# Patient Record
Sex: Female | Born: 1950 | Race: White | Hispanic: No | Smoking: Former smoker
Health system: Southern US, Community
[De-identification: ages and names within clinical notes are randomized; demographics above are authoritative.]

## PROBLEM LIST (undated history)

## (undated) DIAGNOSIS — G4730 Sleep apnea, unspecified: Secondary | ICD-10-CM

## (undated) DIAGNOSIS — R519 Headache, unspecified: Secondary | ICD-10-CM

## (undated) DIAGNOSIS — F4310 Post-traumatic stress disorder, unspecified: Secondary | ICD-10-CM

## (undated) DIAGNOSIS — M1990 Unspecified osteoarthritis, unspecified site: Secondary | ICD-10-CM

## (undated) DIAGNOSIS — G20 Parkinson's disease: Secondary | ICD-10-CM

## (undated) DIAGNOSIS — G20A1 Parkinson's disease without dyskinesia, without mention of fluctuations: Secondary | ICD-10-CM

## (undated) DIAGNOSIS — E119 Type 2 diabetes mellitus without complications: Secondary | ICD-10-CM

## (undated) DIAGNOSIS — M797 Fibromyalgia: Secondary | ICD-10-CM

## (undated) DIAGNOSIS — I10 Essential (primary) hypertension: Secondary | ICD-10-CM

## (undated) DIAGNOSIS — F329 Major depressive disorder, single episode, unspecified: Secondary | ICD-10-CM

## (undated) DIAGNOSIS — E785 Hyperlipidemia, unspecified: Secondary | ICD-10-CM

## (undated) DIAGNOSIS — F419 Anxiety disorder, unspecified: Secondary | ICD-10-CM

## (undated) DIAGNOSIS — F32A Depression, unspecified: Secondary | ICD-10-CM

## (undated) DIAGNOSIS — R51 Headache: Secondary | ICD-10-CM

## (undated) DIAGNOSIS — J302 Other seasonal allergic rhinitis: Secondary | ICD-10-CM

## (undated) DIAGNOSIS — K635 Polyp of colon: Secondary | ICD-10-CM

## (undated) DIAGNOSIS — L409 Psoriasis, unspecified: Secondary | ICD-10-CM

## (undated) HISTORY — PX: TONSILLECTOMY: SUR1361

## (undated) HISTORY — PX: ABDOMINAL SURGERY: SHX537

## (undated) HISTORY — PX: RIGHT OOPHORECTOMY: SHX2359

## (undated) HISTORY — PX: ABDOMINAL HYSTERECTOMY: SHX81

## (undated) HISTORY — PX: POLYPECTOMY: SHX149

## (undated) HISTORY — PX: COLON SURGERY: SHX602

## (undated) HISTORY — PX: COLONOSCOPY: SHX174

---

## 2007-08-16 ENCOUNTER — Ambulatory Visit: Payer: Self-pay | Admitting: Internal Medicine

## 2008-05-31 ENCOUNTER — Emergency Department: Payer: Self-pay | Admitting: Emergency Medicine

## 2008-08-30 ENCOUNTER — Ambulatory Visit: Payer: Self-pay | Admitting: Internal Medicine

## 2010-06-20 ENCOUNTER — Ambulatory Visit: Payer: Self-pay | Admitting: Internal Medicine

## 2010-07-01 ENCOUNTER — Ambulatory Visit: Payer: Self-pay | Admitting: Internal Medicine

## 2010-07-31 ENCOUNTER — Ambulatory Visit: Payer: Self-pay | Admitting: Internal Medicine

## 2010-08-31 ENCOUNTER — Ambulatory Visit: Payer: Self-pay | Admitting: Internal Medicine

## 2010-12-11 ENCOUNTER — Ambulatory Visit: Payer: Self-pay | Admitting: Internal Medicine

## 2011-03-02 ENCOUNTER — Ambulatory Visit: Payer: Self-pay | Admitting: Gastroenterology

## 2011-03-05 LAB — PATHOLOGY REPORT

## 2011-04-16 ENCOUNTER — Ambulatory Visit: Payer: Self-pay | Admitting: Surgery

## 2011-04-23 ENCOUNTER — Inpatient Hospital Stay: Payer: Self-pay | Admitting: Surgery

## 2011-04-27 LAB — PATHOLOGY REPORT

## 2011-07-17 ENCOUNTER — Emergency Department: Payer: Self-pay | Admitting: *Deleted

## 2012-02-16 ENCOUNTER — Ambulatory Visit: Payer: Self-pay | Admitting: Neurology

## 2012-06-09 ENCOUNTER — Ambulatory Visit: Payer: Self-pay | Admitting: Gastroenterology

## 2012-12-23 ENCOUNTER — Other Ambulatory Visit: Payer: Self-pay | Admitting: Podiatry

## 2012-12-27 LAB — WOUND CULTURE

## 2013-03-07 ENCOUNTER — Ambulatory Visit: Payer: Self-pay | Admitting: Internal Medicine

## 2014-01-18 DIAGNOSIS — I10 Essential (primary) hypertension: Secondary | ICD-10-CM | POA: Insufficient documentation

## 2014-01-18 DIAGNOSIS — E559 Vitamin D deficiency, unspecified: Secondary | ICD-10-CM | POA: Insufficient documentation

## 2014-01-18 DIAGNOSIS — E782 Mixed hyperlipidemia: Secondary | ICD-10-CM | POA: Insufficient documentation

## 2014-04-12 ENCOUNTER — Ambulatory Visit: Payer: Self-pay | Admitting: Internal Medicine

## 2014-08-06 ENCOUNTER — Emergency Department: Payer: Self-pay | Admitting: Student

## 2014-10-07 DIAGNOSIS — G20 Parkinson's disease: Secondary | ICD-10-CM | POA: Insufficient documentation

## 2014-10-19 ENCOUNTER — Ambulatory Visit: Payer: Self-pay | Admitting: Neurology

## 2015-02-26 ENCOUNTER — Ambulatory Visit: Payer: Medicare Other | Attending: Neurology

## 2015-02-26 DIAGNOSIS — G4730 Sleep apnea, unspecified: Secondary | ICD-10-CM | POA: Insufficient documentation

## 2015-04-03 ENCOUNTER — Ambulatory Visit: Payer: Medicare Other | Attending: Neurology

## 2015-04-03 DIAGNOSIS — G8929 Other chronic pain: Secondary | ICD-10-CM | POA: Diagnosis not present

## 2015-04-03 DIAGNOSIS — E119 Type 2 diabetes mellitus without complications: Secondary | ICD-10-CM | POA: Insufficient documentation

## 2015-04-03 DIAGNOSIS — G4733 Obstructive sleep apnea (adult) (pediatric): Secondary | ICD-10-CM | POA: Diagnosis present

## 2015-04-03 DIAGNOSIS — C801 Malignant (primary) neoplasm, unspecified: Secondary | ICD-10-CM | POA: Diagnosis not present

## 2015-04-03 DIAGNOSIS — R413 Other amnesia: Secondary | ICD-10-CM | POA: Insufficient documentation

## 2015-04-03 DIAGNOSIS — G20 Parkinson's disease: Secondary | ICD-10-CM | POA: Insufficient documentation

## 2015-04-03 DIAGNOSIS — I10 Essential (primary) hypertension: Secondary | ICD-10-CM | POA: Diagnosis not present

## 2015-05-08 ENCOUNTER — Encounter: Payer: Self-pay | Admitting: Emergency Medicine

## 2015-05-08 ENCOUNTER — Emergency Department
Admission: EM | Admit: 2015-05-08 | Discharge: 2015-05-09 | Disposition: A | Discharge status: Other Acute Inpatient Hospital | Payer: Medicare Other | Attending: Emergency Medicine | Admitting: Emergency Medicine

## 2015-05-08 DIAGNOSIS — R451 Restlessness and agitation: Secondary | ICD-10-CM | POA: Diagnosis not present

## 2015-05-08 DIAGNOSIS — F4310 Post-traumatic stress disorder, unspecified: Secondary | ICD-10-CM

## 2015-05-08 DIAGNOSIS — F911 Conduct disorder, childhood-onset type: Secondary | ICD-10-CM | POA: Diagnosis present

## 2015-05-08 DIAGNOSIS — I10 Essential (primary) hypertension: Secondary | ICD-10-CM

## 2015-05-08 DIAGNOSIS — E119 Type 2 diabetes mellitus without complications: Secondary | ICD-10-CM

## 2015-05-08 DIAGNOSIS — F312 Bipolar disorder, current episode manic severe with psychotic features: Secondary | ICD-10-CM

## 2015-05-08 HISTORY — DX: Depression, unspecified: F32.A

## 2015-05-08 HISTORY — DX: Major depressive disorder, single episode, unspecified: F32.9

## 2015-05-08 HISTORY — DX: Anxiety disorder, unspecified: F41.9

## 2015-05-08 LAB — ACETAMINOPHEN LEVEL: Acetaminophen (Tylenol), Serum: <10 ug/mL — ABNORMAL LOW (ref 10–30)

## 2015-05-08 LAB — COMPREHENSIVE METABOLIC PANEL
ALT: 12 U/L — ABNORMAL LOW (ref 14–54)
AST: 41 U/L (ref 15–41)
Albumin: 5.3 g/dL — ABNORMAL HIGH (ref 3.5–5.0)
Alkaline Phosphatase: 37 U/L — ABNORMAL LOW (ref 38–126)
Anion gap: 13 (ref 5–15)
BUN: 11 mg/dL (ref 6–20)
CO2: 22 mmol/L (ref 22–32)
Calcium: 9.8 mg/dL (ref 8.9–10.3)
Chloride: 105 mmol/L (ref 101–111)
Creatinine, Ser: 0.57 mg/dL (ref 0.44–1.00)
GFR calc Af Amer: >60 mL/min (ref >60)
GFR calc non Af Amer: >60 mL/min (ref >60)
Glucose, Bld: 146 mg/dL — ABNORMAL HIGH (ref 65–99)
Potassium: 3.9 mmol/L (ref 3.5–5.1)
Sodium: 140 mmol/L (ref 135–145)
Total Bilirubin: 0.6 mg/dL (ref 0.3–1.2)
Total Protein: 8.2 g/dL — ABNORMAL HIGH (ref 6.5–8.1)

## 2015-05-08 LAB — URINE DRUG SCREEN, QUALITATIVE (ARMC ONLY)
Amphetamines, Ur Screen: NOT DETECTED
Barbiturates, Ur Screen: NOT DETECTED
Benzodiazepine, Ur Scrn: NOT DETECTED
Cannabinoid 50 Ng, Ur ~~LOC~~: NOT DETECTED
Cocaine Metabolite,Ur ~~LOC~~: NOT DETECTED
MDMA (Ecstasy)Ur Screen: NOT DETECTED
Methadone Scn, Ur: NOT DETECTED
Opiate, Ur Screen: NOT DETECTED
Phencyclidine (PCP) Ur S: NOT DETECTED
Tricyclic, Ur Screen: NOT DETECTED

## 2015-05-08 LAB — URINALYSIS COMPLETE WITH MICROSCOPIC (ARMC ONLY)
Bilirubin Urine: NEGATIVE
Glucose, UA: >500 mg/dL — AB
Hgb urine dipstick: NEGATIVE
Leukocytes, UA: NEGATIVE
Nitrite: NEGATIVE
Protein, ur: NEGATIVE mg/dL
Specific Gravity, Urine: 1.005 (ref 1.005–1.030)
pH: 6 (ref 5.0–8.0)

## 2015-05-08 LAB — CBC
HCT: 39.8 % (ref 35.0–47.0)
Hemoglobin: 13.9 g/dL (ref 12.0–16.0)
MCH: 32.3 pg (ref 26.0–34.0)
MCHC: 35 g/dL (ref 32.0–36.0)
MCV: 92.4 fL (ref 80.0–100.0)
Platelets: 280 10*3/uL (ref 150–440)
RBC: 4.31 MIL/uL (ref 3.80–5.20)
RDW: 12.2 % (ref 11.5–14.5)
WBC: 8.1 10*3/uL (ref 3.6–11.0)

## 2015-05-08 LAB — ETHANOL: Alcohol, Ethyl (B): <5 mg/dL (ref <5)

## 2015-05-08 LAB — SALICYLATE LEVEL: Salicylate Lvl: <4 mg/dL (ref 2.8–30.0)

## 2015-05-08 NOTE — ED Notes (Signed)
BEHAVIORAL HEALTH ROUNDING Patient sleeping: Yes.   Patient alert and oriented: yes Behavior appropriate: Yes.  ; If no, describe:  Nutrition and fluids offered: Yes  Toileting and hygiene offered: Yes  Sitter present: no Law enforcement present: Yes  

## 2015-05-08 NOTE — ED Notes (Signed)

## 2015-05-08 NOTE — ED Notes (Signed)
pts cpap brought from home, bio med called for evaluation for pt use
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
## 2015-05-08 NOTE — ED Notes (Signed)
BEHAVIORAL HEALTH ROUNDING Patient sleeping: No. Patient alert and oriented: yes Behavior appropriate: Yes.  ;  Nutrition and fluids offered: Yes  Toileting and hygiene offered: Yes  Sitter present: yes Law enforcement present: Yes  
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
## 2015-05-08 NOTE — BHH Counselor (Signed)
Cart 2 placed in Pt room for TTS tele-assessment.

## 2015-05-08 NOTE — ED Notes (Signed)
BEHAVIORAL HEALTH ROUNDING Patient sleeping: No. Patient alert and oriented: yes Behavior appropriate: Yes.  ; If no, describe:  Nutrition and fluids offered: Yes  Toileting and hygiene offered: Yes  Sitter present: no Law enforcement present: Yes  

## 2015-05-08 NOTE — BHH Counselor (Signed)
Writer reviewed Pt Kendra Nicholson assessment and spoke with TTS Counselor Izora Gala S.). Writer consulted EDP Dr.Paduchowski regarding Pt disposition. Pt to be referred to Psych. MD for consult.
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
## 2015-05-08 NOTE — ED Notes (Signed)

## 2015-05-08 NOTE — ED Notes (Signed)
BEHAVIORAL HEALTH ROUNDING Patient sleeping: No. Patient alert and oriented: yes Behavior appropriate: Yes.  ; If no, describe:  Nutrition and fluids offered: Yes  Toileting and hygiene offered: Yes  Sitter present:no Law enforcement present: yes 
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
## 2015-05-09 ENCOUNTER — Inpatient Hospital Stay
Admission: EM | Admit: 2015-05-09 | Discharge: 2015-05-23 | DRG: 885 | Disposition: A | Discharge status: Home / Self Care | Payer: Medicare Other | Source: Intra-hospital | Attending: Psychiatry | Admitting: Psychiatry

## 2015-05-09 ENCOUNTER — Encounter: Payer: Self-pay | Admitting: Psychiatry

## 2015-05-09 DIAGNOSIS — Z818 Family history of other mental and behavioral disorders: Secondary | ICD-10-CM

## 2015-05-09 DIAGNOSIS — F3181 Bipolar II disorder: Secondary | ICD-10-CM | POA: Diagnosis present

## 2015-05-09 DIAGNOSIS — Z590 Homelessness: Secondary | ICD-10-CM | POA: Diagnosis not present

## 2015-05-09 DIAGNOSIS — R451 Restlessness and agitation: Secondary | ICD-10-CM | POA: Diagnosis not present

## 2015-05-09 DIAGNOSIS — Z79899 Other long term (current) drug therapy: Secondary | ICD-10-CM

## 2015-05-09 DIAGNOSIS — G4700 Insomnia, unspecified: Secondary | ICD-10-CM | POA: Diagnosis present

## 2015-05-09 DIAGNOSIS — Z91041 Radiographic dye allergy status: Secondary | ICD-10-CM | POA: Diagnosis not present

## 2015-05-09 DIAGNOSIS — E785 Hyperlipidemia, unspecified: Secondary | ICD-10-CM | POA: Diagnosis present

## 2015-05-09 DIAGNOSIS — F312 Bipolar disorder, current episode manic severe with psychotic features: Secondary | ICD-10-CM | POA: Insufficient documentation

## 2015-05-09 DIAGNOSIS — R45851 Suicidal ideations: Secondary | ICD-10-CM | POA: Diagnosis present

## 2015-05-09 DIAGNOSIS — I10 Essential (primary) hypertension: Secondary | ICD-10-CM | POA: Diagnosis present

## 2015-05-09 DIAGNOSIS — E119 Type 2 diabetes mellitus without complications: Secondary | ICD-10-CM

## 2015-05-09 DIAGNOSIS — M797 Fibromyalgia: Secondary | ICD-10-CM | POA: Diagnosis present

## 2015-05-09 DIAGNOSIS — F4310 Post-traumatic stress disorder, unspecified: Secondary | ICD-10-CM | POA: Diagnosis present

## 2015-05-09 DIAGNOSIS — G8929 Other chronic pain: Secondary | ICD-10-CM | POA: Diagnosis present

## 2015-05-09 DIAGNOSIS — F419 Anxiety disorder, unspecified: Secondary | ICD-10-CM | POA: Diagnosis present

## 2015-05-09 DIAGNOSIS — G20 Parkinson's disease: Secondary | ICD-10-CM | POA: Diagnosis present

## 2015-05-09 DIAGNOSIS — Z888 Allergy status to other drugs, medicaments and biological substances status: Secondary | ICD-10-CM

## 2015-05-09 DIAGNOSIS — Z915 Personal history of self-harm: Secondary | ICD-10-CM

## 2015-05-09 HISTORY — DX: Type 2 diabetes mellitus without complications: E11.9

## 2015-05-09 HISTORY — DX: Essential (primary) hypertension: I10

## 2015-05-09 LAB — LIPID PANEL
Cholesterol: 200 mg/dL (ref 0–200)
HDL: 43 mg/dL (ref >40)
LDL Cholesterol: UNDETERMINED mg/dL (ref 0–99)
Total CHOL/HDL Ratio: 4.7 RATIO
Triglycerides: 435 mg/dL — ABNORMAL HIGH (ref <150)
VLDL: UNDETERMINED mg/dL (ref 0–40)

## 2015-05-09 LAB — TSH: TSH: 3.609 u[IU]/mL (ref 0.350–4.500)

## 2015-05-09 LAB — GLUCOSE, CAPILLARY
Glucose-Capillary: 116 mg/dL — ABNORMAL HIGH (ref 65–99)
Glucose-Capillary: 168 mg/dL — ABNORMAL HIGH (ref 65–99)

## 2015-05-09 MED ORDER — CLOTRIMAZOLE 1 % EX CREA
TOPICAL_CREAM | Freq: Two times a day (BID) | CUTANEOUS | Status: DC
  Administered 2015-05-10: 22:00:00 via TOPICAL
  Administered 2015-05-10: 1 via TOPICAL
  Administered 2015-05-11: 22:00:00 via TOPICAL
  Administered 2015-05-11: 1 via TOPICAL
  Administered 2015-05-12 – 2015-05-15 (×8): via TOPICAL
  Administered 2015-05-16: 1 via TOPICAL
  Administered 2015-05-16 – 2015-05-17 (×3): via TOPICAL
  Administered 2015-05-18: 1 via TOPICAL
  Administered 2015-05-18 – 2015-05-21 (×6): via TOPICAL
  Administered 2015-05-21: 1 via TOPICAL
  Administered 2015-05-22: 10:00:00 via TOPICAL
  Administered 2015-05-22: 1 via TOPICAL
  Administered 2015-05-23: 10:00:00 via TOPICAL
  Filled 2015-05-09 (×3): qty 15

## 2015-05-09 MED ORDER — METOPROLOL SUCCINATE ER 50 MG PO TB24
50.0000 mg | ORAL_TABLET | Freq: Every day | ORAL | Status: DC
  Administered 2015-05-09: 50 mg via ORAL
  Filled 2015-05-09: qty 1

## 2015-05-09 MED ORDER — ALUM & MAG HYDROXIDE-SIMETH 200-200-20 MG/5ML PO SUSP: 30.0000 mL | ORAL | Status: DC | PRN

## 2015-05-09 MED ORDER — TIZANIDINE HCL 2 MG PO TABS: 2.0000 mg | ORAL_TABLET | Freq: Four times a day (QID) | ORAL | Status: DC | PRN

## 2015-05-09 MED ORDER — AMLODIPINE BESYLATE 5 MG PO TABS
10.0000 mg | ORAL_TABLET | Freq: Every day | ORAL | Status: DC
  Administered 2015-05-09: 10 mg via ORAL
  Filled 2015-05-09: qty 2

## 2015-05-09 MED ORDER — BUSPIRONE HCL 5 MG PO TABS
15.0000 mg | ORAL_TABLET | Freq: Two times a day (BID) | ORAL | Status: DC
  Administered 2015-05-09 – 2015-05-23 (×28): 15 mg via ORAL
  Filled 2015-05-09 (×30): qty 1

## 2015-05-09 MED ORDER — AMLODIPINE BESYLATE 10 MG PO TABS
10.0000 mg | ORAL_TABLET | Freq: Every day | ORAL | Status: DC
  Administered 2015-05-10 – 2015-05-23 (×14): 10 mg via ORAL
  Filled 2015-05-09 (×15): qty 1

## 2015-05-09 MED ORDER — CITALOPRAM HYDROBROMIDE 20 MG PO TABS
40.0000 mg | ORAL_TABLET | Freq: Every day | ORAL | Status: DC
  Administered 2015-05-09: 40 mg via ORAL
  Filled 2015-05-09: qty 2

## 2015-05-09 MED ORDER — ESTRADIOL 1 MG PO TABS
1.0000 mg | ORAL_TABLET | Freq: Every day | ORAL | Status: DC
  Administered 2015-05-09: 1 mg via ORAL
  Filled 2015-05-09: qty 1

## 2015-05-09 MED ORDER — ACETAMINOPHEN 325 MG PO TABS
650.0000 mg | ORAL_TABLET | Freq: Four times a day (QID) | ORAL | Status: DC | PRN
  Administered 2015-05-09 – 2015-05-23 (×33): 650 mg via ORAL
  Filled 2015-05-09 (×32): qty 2

## 2015-05-09 MED ORDER — CARBIDOPA-LEVODOPA 25-100 MG PO TABS
1.0000 | ORAL_TABLET | Freq: Three times a day (TID) | ORAL | Status: DC
  Administered 2015-05-09 – 2015-05-23 (×41): 1 via ORAL
  Filled 2015-05-09 (×43): qty 1

## 2015-05-09 MED ORDER — CLOTRIMAZOLE 1 % EX CREA
TOPICAL_CREAM | Freq: Two times a day (BID) | CUTANEOUS | Status: DC
  Filled 2015-05-09: qty 15

## 2015-05-09 MED ORDER — DOXEPIN HCL 25 MG PO CAPS
25.0000 mg | ORAL_CAPSULE | Freq: Every day | ORAL | Status: DC
Start: 2015-05-09 — End: 2015-05-09

## 2015-05-09 MED ORDER — METFORMIN HCL 500 MG PO TABS
1000.0000 mg | ORAL_TABLET | Freq: Two times a day (BID) | ORAL | Status: DC
  Administered 2015-05-09 – 2015-05-23 (×29): 1000 mg via ORAL
  Filled 2015-05-09 (×29): qty 2

## 2015-05-09 MED ORDER — BUSPIRONE HCL 10 MG PO TABS: 15.0000 mg | ORAL_TABLET | Freq: Two times a day (BID) | ORAL | Status: DC

## 2015-05-09 MED ORDER — PRAVASTATIN SODIUM 20 MG PO TABS
20.0000 mg | ORAL_TABLET | Freq: Every day | ORAL | Status: DC
  Administered 2015-05-09 – 2015-05-23 (×15): 20 mg via ORAL
  Filled 2015-05-09 (×16): qty 1

## 2015-05-09 MED ORDER — PRAVASTATIN SODIUM 20 MG PO TABS: 20.0000 mg | ORAL_TABLET | Freq: Every day | ORAL | Status: DC

## 2015-05-09 MED ORDER — METOPROLOL TARTRATE 50 MG PO TABS: 50.0000 mg | ORAL_TABLET | Freq: Every morning | ORAL | Status: DC

## 2015-05-09 MED ORDER — CITALOPRAM HYDROBROMIDE 20 MG PO TABS
40.0000 mg | ORAL_TABLET | Freq: Every day | ORAL | Status: DC
  Administered 2015-05-10 – 2015-05-23 (×14): 40 mg via ORAL
  Filled 2015-05-09: qty 1
  Filled 2015-05-09 (×14): qty 2

## 2015-05-09 MED ORDER — TIZANIDINE HCL 4 MG PO TABS
2.0000 mg | ORAL_TABLET | Freq: Four times a day (QID) | ORAL | Status: DC | PRN
  Administered 2015-05-12 – 2015-05-23 (×16): 2 mg via ORAL
  Filled 2015-05-09 (×17): qty 1

## 2015-05-09 MED ORDER — MAGNESIUM HYDROXIDE 400 MG/5ML PO SUSP: 30.0000 mL | Freq: Every day | ORAL | Status: DC | PRN

## 2015-05-09 MED ORDER — GLIPIZIDE ER 5 MG PO TB24
5.0000 mg | ORAL_TABLET | Freq: Every day | ORAL | Status: DC
  Administered 2015-05-10 – 2015-05-23 (×14): 5 mg via ORAL
  Filled 2015-05-09 (×14): qty 1

## 2015-05-09 MED ORDER — ESTRADIOL 1 MG PO TABS
1.0000 mg | ORAL_TABLET | Freq: Every day | ORAL | Status: DC
  Administered 2015-05-10 – 2015-05-23 (×14): 1 mg via ORAL
  Filled 2015-05-09 (×13): qty 1

## 2015-05-09 MED ORDER — CARBIDOPA-LEVODOPA 25-100 MG PO TABS
1.0000 | ORAL_TABLET | Freq: Three times a day (TID) | ORAL | Status: DC
  Administered 2015-05-09: 1 via ORAL
  Filled 2015-05-09 (×3): qty 1

## 2015-05-09 MED ORDER — QUETIAPINE FUMARATE 100 MG PO TABS
100.0000 mg | ORAL_TABLET | Freq: Every day | ORAL | Status: DC
  Administered 2015-05-09 – 2015-05-11 (×3): 100 mg via ORAL
  Filled 2015-05-09 (×3): qty 1

## 2015-05-09 MED ORDER — METFORMIN HCL 500 MG PO TABS
1000.0000 mg | ORAL_TABLET | Freq: Two times a day (BID) | ORAL | Status: DC
  Administered 2015-05-09: 1000 mg via ORAL
  Filled 2015-05-09 (×2): qty 2

## 2015-05-09 MED ORDER — GLIPIZIDE ER 5 MG PO TB24
5.0000 mg | ORAL_TABLET | Freq: Every day | ORAL | Status: DC
  Administered 2015-05-09: 5 mg via ORAL
  Filled 2015-05-09 (×2): qty 1

## 2015-05-09 MED ORDER — METOPROLOL SUCCINATE ER 25 MG PO TB24
50.0000 mg | ORAL_TABLET | Freq: Every day | ORAL | Status: DC
  Administered 2015-05-10 – 2015-05-23 (×14): 50 mg via ORAL
  Filled 2015-05-09 (×14): qty 2

## 2015-05-09 MED ORDER — DOXEPIN HCL 25 MG PO CAPS
25.0000 mg | ORAL_CAPSULE | Freq: Every day | ORAL | Status: DC
  Administered 2015-05-09 – 2015-05-12 (×4): 25 mg via ORAL
  Filled 2015-05-09 (×4): qty 1

## 2015-05-09 NOTE — ED Notes (Signed)
Report called to [facility] RN working in [employer]

## 2015-05-09 NOTE — ED Notes (Signed)
Lunch provided along with an extra drink  Pt observed with no unusual behavior  Appropriate to stimulation  No verbalized needs or concerns at this time  NAD assessed  Continue to monitor 

## 2015-05-09 NOTE — ED Notes (Signed)
She has ambulated to and from the BR  Pt observed with no unusual behavior  Appropriate to stimulation  No verbalized needs or concerns at this time  NAD assessed  Continue to monitor

## 2015-05-09 NOTE — ED Notes (Signed)
BEHAVIORAL HEALTH ROUNDING Patient sleeping: Yes.   Patient alert and oriented: eyes closed  Appears asleep Behavior appropriate: Yes.  ; If no, describe:  Nutrition and fluids offered: Yes  Toileting and hygiene offered: sleeping Sitter present: q 15 minute observations and security camera monitoring Law enforcement present: yes  ODS 

## 2015-05-09 NOTE — ED Notes (Signed)
BEHAVIORAL HEALTH ROUNDING Patient sleeping: Yes.   Patient alert and oriented: yes Behavior appropriate: Yes.  ; If no, describe:  Nutrition and fluids offered: Yes  Toileting and hygiene offered: Yes  Sitter present: no Law enforcement present: Yes  
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
## 2015-05-09 NOTE — ED Notes (Signed)
ENVIRONMENTAL ASSESSMENT Potentially harmful objects out of patient reach: Yes.   Personal belongings secured: Yes.   Patient dressed in hospital provided attire only: Yes.   Plastic bags out of patient reach: Yes.   Patient care equipment (cords, cables, call bells, lines, and drains) shortened, removed, or accounted for: Yes.   Equipment and supplies removed from bottom of stretcher: Yes.   Potentially toxic materials out of patient reach: Yes.   Sharps container removed or out of patient reach: Yes.     BEHAVIORAL HEALTH ROUNDING Patient sleeping: No. Patient alert and oriented: yes Behavior appropriate: Yes.  ; If no, describe:  Nutrition and fluids offered: yes Toileting and hygiene offered: Yes  Sitter present: q15 minute observations and security camera monitoring Law enforcement present: Yes  ODS  

## 2015-05-09 NOTE — ED Notes (Signed)
BEHAVIORAL HEALTH ROUNDING Patient sleeping: No. Patient alert and oriented: yes Behavior appropriate: Yes.  ; If no, describe:  Nutrition and fluids offered: yes Toileting and hygiene offered: Yes  Sitter present: q15 minute observations and security camera monitoring Law enforcement present: Yes  ODS  

## 2015-05-09 NOTE — ED Notes (Signed)
Patient assigned to appropriate care area. Patient oriented to unit/care area: Informed that, for their safety, care areas are designed for safety and monitored by security cameras at all times; and visiting hours explained to patient. Patient verbalizes understanding, and verbal contract for safety obtained.   ENVIRONMENTAL ASSESSMENT Potentially harmful objects out of patient reach: Yes.   Personal belongings secured: Yes.   Patient dressed in hospital provided attire only: Yes.   Plastic bags out of patient reach: Yes.   Patient care equipment (cords, cables, call bells, lines, and drains) shortened, removed, or accounted for: Yes.   Equipment and supplies removed from bottom of stretcher: Yes.   Potentially toxic materials out of patient reach: Yes.   Sharps container removed or out of patient reach: Yes.   

## 2015-05-09 NOTE — Progress Notes (Signed)
Patient resides in a [facility] ( [place]) [phone]. Patient apparently was served her 30 days notice and this still needs to be substantiated.LCSW called [facility] [phone] and left a detailed message awaiting a  call back.

## 2015-05-09 NOTE — ED Notes (Signed)
Breakfast provided  Pt observed with no unusual behavior  Appropriate to stimulation  No verbalized needs or concerns at this time  NAD assessed  Continue to monitor 

## 2015-05-09 NOTE — [facility] Counselor (Signed)
Pt. is to be admitted to [facility] by Dr. Weber Cooks. Attending Physician will be Dr. Bary Leriche.  Pt. has been assigned to room 301, by [facility]   Intake Paper Work has been signed and placed on pt. chart. ER staff [facility] Dr. Cinda Quest , ER MD; Amy H. Patient's Nurse & Debroah Baller Patient Access) have been made aware of the admission.   05/09/2015  Con Memos, MS, [place], [facility]

## 2015-05-09 NOTE — [facility] Counselor (Signed)
Pt is to be admitted to [facility], Information forwarded to the charge nurse, Silva Bandy. Awaiting discharges so that a bed assignment can be allocated.   05/09/2015 Con Memos, MS, [place]

## 2015-05-09 NOTE — ED Notes (Signed)
Pt reports that she has not been treated right at her current living situation -

## 2015-05-09 NOTE — ED Notes (Signed)
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
## 2015-05-10 DIAGNOSIS — F4310 Post-traumatic stress disorder, unspecified: Secondary | ICD-10-CM

## 2015-05-10 LAB — HEMOGLOBIN A1C: Hgb A1c MFr Bld: 5.9 % (ref 4.0–6.0)

## 2015-05-10 NOTE — Plan of Care (Signed)
Problem: Ineffective individual coping Goal: STG: Patient will remain free from self harm Outcome: Progressing Patient denies wanting to hurt herself at this time.
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
## 2015-05-10 NOTE — Progress Notes (Signed)
Recreation Therapy Notes  Date: 09.09.16 Time: 3:00 pm Location: Craft Room  Group Topic: Coping Skills  Goal Area(s) Addresses:  Patient will participate in coping skill. Patient will verbalize benefit of using art as a coping skill.  Behavioral Response: Attentive, Left early  Intervention: Coloring  Activity: Patients were given coloring sheets and instructed to color and think about the emotions they experienced.  Education: LRT educated patients on healthy coping skills.  Education Outcome: Patient left group before LRT educated patients.  Clinical Observations/Feedback: Patient colored worksheet. Patient left group at approximately 3:42 pm. Patient did not return to group.  Leonette Monarch, LRT/CTRS 05/10/2015 4:23 PM

## 2015-05-10 NOTE — BHH Group Notes (Signed)
[place] LCSW Group Therapy  05/10/2015 2:33 PM  Type of Therapy:  Group Therapy  Participation Level:  Active  Participation Quality:  Appropriate and Attentive  Affect:  Angry  Cognitive:  Alert, Appropriate and Oriented  Insight:  Limited  Engagement in Therapy:  Engaged  Modes of Intervention:  Socialization and Support  Summary of Progress/Problems: Patient arrived late for group and only shared the last 10 minutes of group stating that she was brought to the hospital in handcuffs by police and that she does not like guns. 

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
## 2015-05-10 NOTE — Tx Team (Signed)
Initial Interdisciplinary Treatment Plan   PATIENT STRESSORS: Traumatic event   PATIENT STRENGTHS: Communication skills Religious Affiliation   PROBLEM LIST: Problem List/Patient Goals Date to be addressed Date deferred Reason deferred Estimated date of resolution  Aggression 05/10/15     Anxiety 05/10/15                                                DISCHARGE CRITERIA:  Improved stabilization in mood, thinking, and/or behavior  PRELIMINARY DISCHARGE PLAN: Outpatient therapy  PATIENT/FAMIILY INVOLVEMENT: This treatment plan has been presented to and reviewed with the patient, Kendra Nicholson, and/or family member.  The patient and family have been given the opportunity to ask questions and make suggestions.  Nash Mantis [facility] 05/10/2015, 1:37 AM

## 2015-05-10 NOTE — Progress Notes (Signed)
D: Affect angry and hostile this am shift. Noted to blame other for her issues. Stated she did not do anything to any of the residents at the rehab facility Patient would be apologetic following . Patient walking with a walker . Appetite good and voice  no  concerns around sleeping , voice of being tired.  Appropriate ADL's and personal  Issues. Area beneath  Her abdomen fold noted redden  No drainage  Noted . A: Encourage patient participation , Instruction given on medication . Verbalized understanding of medication  Received . R: Voice no other concerns ,

## 2015-05-10 NOTE — Progress Notes (Signed)
Kendra Nicholson was pleasant on approach. She interacted well with peers and staff, she came to the med room for medication and was med compliant. She is still using walker to assist with ambulation. No aggression or SIB noted on shift. Patient appears to be resting in bed at this time. No behavioral issues to report on shift at this time.

## 2015-05-10 NOTE — Plan of Care (Signed)
Problem: Ineffective individual coping Goal: STG-Increase in ability to manage activities of daily living Outcome: Not Progressing Labile , easily angered
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
## 2015-05-11 MED ORDER — QUETIAPINE FUMARATE 25 MG PO TABS
25.0000 mg | ORAL_TABLET | Freq: Every morning | ORAL | Status: DC
  Administered 2015-05-12: 25 mg via ORAL
  Filled 2015-05-11: qty 1

## 2015-05-11 MED ORDER — FLUTICASONE PROPIONATE 50 MCG/ACT NA SUSP
2.0000 | Freq: Every day | NASAL | Status: DC
  Administered 2015-05-11 – 2015-05-23 (×13): 2 via NASAL
  Filled 2015-05-11: qty 16

## 2015-05-11 NOTE — BHH Group Notes (Signed)
[place] Group Notes:  (Nursing/MHT/Case Management/Adjunct)  Date:  05/11/2015  Time:  12:13 PM  Type of Therapy:  Psychoeducational Skills  Participation Level:  Active  Participation Quality:  Intrusive  Affect:  Blunted and Excited  Cognitive:  Disorganized  Insight:  Improving  Engagement in Group:  Off Topic  Modes of Intervention:  Clarification  Summary of Progress/Problems:  Celso Amy 05/11/2015, 12:13 PM
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
## 2015-05-11 NOTE — Plan of Care (Signed)
Problem: Aggression Towards others,Towards Self, and or Destruction Goal: STG-Patient will comply with prescribed medication regimen (Patient will comply with prescribed medication regimen)  Outcome: Progressing Compliant with medication regiment
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
## 2015-05-11 NOTE — Progress Notes (Signed)
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

## 2015-05-12 LAB — GLUCOSE, CAPILLARY: Glucose-Capillary: 122 mg/dL — ABNORMAL HIGH (ref 65–99)

## 2015-05-12 MED ORDER — QUETIAPINE FUMARATE 25 MG PO TABS
150.0000 mg | ORAL_TABLET | Freq: Every day | ORAL | Status: DC
  Filled 2015-05-12: qty 2

## 2015-05-12 NOTE — Progress Notes (Signed)
Ambulates through hallways with walker. Was medication compliant. Denied AVH, SI, HI. Had an uneventful night.
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
## 2015-05-12 NOTE — Plan of Care (Signed)
Problem: Ineffective individual coping Goal: STG: Patient will remain free from self harm Outcome: Progressing Pt safe on the unit     

## 2015-05-12 NOTE — BHH Group Notes (Signed)
[place] LCSW Group Therapy  05/12/2015 2:44 PM  Type of Therapy:  Group Therapy  Participation Level:  Did Not Attend  Modes of Intervention:  Discussion, Education, Socialization and Support  Summary of Progress/Problems: Feelings around Relapse. Group members discussed the meaning of relapse and shared personal stories of relapse, how it affected them and others, and how they perceived themselves during this time. Group members were encouraged to identify triggers, warning signs and coping skills used when facing the possibility of relapse. Social supports were discussed and explored in detail.   [place] MSW, [place]  05/12/2015, 2:44 PM
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
## 2015-05-12 NOTE — Plan of Care (Signed)
Problem: Aggression Towards others,Towards Self, and or Destruction Goal: STG-Patient will comply with prescribed medication regimen (Patient will comply with prescribed medication regimen)  Outcome: Progressing Compliant with medications.

## 2015-05-13 LAB — GLUCOSE, CAPILLARY: Glucose-Capillary: 123 mg/dL — ABNORMAL HIGH (ref 65–99)

## 2015-05-13 MED ORDER — QUETIAPINE FUMARATE 300 MG PO TABS
300.0000 mg | ORAL_TABLET | Freq: Every day | ORAL | Status: DC
  Administered 2015-05-13: 300 mg via ORAL
  Filled 2015-05-13 (×3): qty 1

## 2015-05-13 NOTE — BHH Suicide Risk Assessment (Signed)
[place] INPATIENT:  Family/Significant Other Suicide Prevention Education  Suicide Prevention Education:  Education Completed; Arsenio Katz (son-in-law) [phone] has been identified by the patient as the family member/significant other with whom the patient will be residing, and identified as the person(s) who will aid the patient in the event of a mental health crisis (suicidal ideations/suicide attempt).  With written consent from the patient, the family member/significant other has been provided the following suicide prevention education, prior to the and/or following the discharge of the patient.  The suicide prevention education provided includes the following:  Suicide risk factors  Suicide prevention and interventions  National Suicide Hotline telephone number  [facility] assessment telephone number  [facility] Emergency Assistance [place] and/or Residential Mobile Crisis Unit telephone number  Request made of family/significant other to:  Remove weapons (e.g., guns, rifles, knives), all items previously/currently identified as safety concern.    Remove drugs/medications (over-the-counter, prescriptions, illicit drugs), all items previously/currently identified as a safety concern.  The family member/significant other verbalizes understanding of the suicide prevention education information provided.  The family member/significant other agrees to remove the items of safety concern listed above.  Keene Breath, MSW LCSWA  05/13/2015, 4:59 PM

## 2015-05-13 NOTE — Progress Notes (Signed)
Recreation Therapy Notes  Date: 09.12.16 Time: 3:00 pm Location: Craft Room  Group Topic: Self-expression  Goal Area(s) Addresses:  Patient will identify one color per emotion listed on wheel. Patient will verbalize benefit of using art as a means of self-expression. Patient will verbalize one emotion experienced during session. Patient will be educated on other forms of self-expression.  Behavioral Response: Did not attend  Intervention: Emotion Wheel  Activity: Patients were given a worksheet with 7 different emotions and were instructed to pick a color for each emotion.  Education: LRT educated patient on different forms of self-expression.   Education Outcome: In group clarification offered   Clinical Observations/Feedback: Patient colored her worksheet like a rainbow and stated afterwards she did not see the emotions written on the worksheet. When asked what color they picked for certain emotions, patient stated colors.  Leonette Monarch, LRT/CTRS 05/13/2015 4:36 PM

## 2015-05-13 NOTE — BHH Group Notes (Signed)
[place] Group Notes:  (Nursing/MHT/Case Management/Adjunct)  Date:  05/13/2015  Time:  9:21 PM  Type of Therapy:  Group Therapy  Participation Level:  Minimal  Participation Quality:  Appropriate  Affect:  Appropriate  Cognitive:  Confused  Insight:  Appropriate  Engagement in Group:  Poor  Modes of Intervention:  Discussion  Summary of Progress/Problems:  Kandis Fantasia 05/13/2015, 9:21 PM
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
## 2015-05-13 NOTE — BHH Group Notes (Signed)
[facility] LCSW Group Therapy  05/13/2015 3:55 PM  Type of Therapy:  Group Therapy  Participation Level:  Did Not Attend   Keene Breath, MSW, LCSWA 05/13/2015, 3:55 PM

## 2015-05-13 NOTE — Plan of Care (Signed)
Problem: Aggression Towards others,Towards Self, and or Destruction Goal: LTG - No aggression,physical/verbal/destruction prior to D/C (Patient will have no episodes of physical or verbal aggression or property destruction towards self or others for _____ day (s) prior to discharge.)  Outcome: Not Progressing Patient verbally abusive toward staff.
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
## 2015-05-13 NOTE — BHH Group Notes (Signed)
[facility] Group Notes:  (Nursing/MHT/Case Management/Adjunct)  Date:  05/13/2015  Time:  1:56 PM  Type of Therapy:  Psychoeducational Skills  Participation Level:  Did Not Attend  Kendra Nicholson 05/13/2015, 1:56 PM

## 2015-05-13 NOTE — Progress Notes (Addendum)
Pt came out of her room  At 0245 stating that she was told that they did not have Tylenol in 15 mg doses. Pt appeared to be confused  And delusional about what she was fussing about. Pt stated she has been here 6 days and they have not gotten the Tylenol changed yet .

## 2015-05-13 NOTE — Progress Notes (Signed)
Patient's son, Moss Mc, called [phone], and said patient called him complaining that she was not receiving proper diet, since she is diabetic. She also complained to him that she was not asked to sign her treatment agreement until this afternoon. The treatment agreement had not been signed as usual on admission, but was an oversight and is a document in which she agrees to comply with unit policies. He mentioned that patient is threatening to sue the hospital over these issues but seemed to realize patient was in a dysphoric state. He also said that patient may try to harm herself if she is transferred to the state facility; this was mentioned to her as a possibility if she did not cooperate with taking her prescribed meds and continued to be verbally abusive toward staff here. MD notified of this. Son said he could be reached any time of day or night if needed. Will continue to monitor.
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
## 2015-05-14 LAB — GLUCOSE, CAPILLARY
Glucose-Capillary: 140 mg/dL — ABNORMAL HIGH (ref 65–99)
Glucose-Capillary: 182 mg/dL — ABNORMAL HIGH (ref 65–99)
Glucose-Capillary: 138 mg/dL — ABNORMAL HIGH (ref 65–99)

## 2015-05-14 MED ORDER — TUBERCULIN PPD 5 UNIT/0.1ML ID SOLN
5.0000 [IU] | Freq: Once | INTRADERMAL | Status: DC
  Filled 2015-05-14: qty 0.1

## 2015-05-14 MED ORDER — QUETIAPINE FUMARATE 25 MG PO TABS
150.0000 mg | ORAL_TABLET | Freq: Every day | ORAL | Status: DC
  Administered 2015-05-14 – 2015-05-22 (×9): 150 mg via ORAL
  Filled 2015-05-14 (×9): qty 2
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
## 2015-05-14 NOTE — [facility] Group Notes (Signed)
[facility] Group Notes:  (Nursing/MHT/Case Management/Adjunct)  Date:  05/14/2015  Time:  2:00 PM  Type of Therapy:  Psychoeducational Skills  Participation Level:  Minimal  Participation Quality:  Redirectable  Affect:  Irritable  Cognitive:  Disorganized  Insight:  Lacking  Engagement in Group:  Limited  Modes of Intervention:  Discussion and Education  Summary of Progress/Problems:  Kendra Nicholson 05/14/2015, 2:00 PM

## 2015-05-14 NOTE — Progress Notes (Signed)
D: Patient denies any SI/HI.  Patient is angry and very anxious at times.  Patient attended all groups this shift.  Patients appetite good and energy level is appropriate for time of the day.  A: supported patient and encouragement given.  Gave scheduled medications on time.  Encouraged patient to attend groups. R: patient receptive of information, patient went to groups, patient still having anger issues with staff.  Will continue to assess.

## 2015-05-14 NOTE — Plan of Care (Signed)
Problem: Ineffective individual coping Goal: STG: Pt will be able to identify effective and ineffective STG: Pt will be able to identify effective and ineffective coping patterns  Outcome: Progressing Patient is less angry this shift and has demonstrated effective coping skills when needed and has attended more groups this shift.

## 2015-05-14 NOTE — BHH Group Notes (Signed)
[facility] LCSW Group Therapy  05/14/2015 2:03 PM  Type of Therapy:  Group Therapy  Participation Level:  Active  Participation Quality:  Intrusive, Monopolizing and Redirectable  Affect:  Angry and Labile  Cognitive:  Disorganized  Insight:  Limited and Off Topic  Engagement in Therapy:  Distracting and Monopolizing  Modes of Intervention:  Limit-setting, Socialization and Support  Summary of Progress/Problems: Patient attended group and was persecutory towards professional staff she has worked with and was angry and labile in group. Patient became distracting focused on delusional maltreatment but was redirectable and was able to relate to sypmtoms she has experienced with the diagnsois in "fear".   Keene Breath, MSW, LCSWA 05/14/2015, 2:03 PM

## 2015-05-14 NOTE — Progress Notes (Signed)
D: Patient denies SI/HI/AVH. Patient affect is irritable and her mood is anxious.  Patient did attend evening group. Patient visible on the milieu. No distress noted. A: Support and encouragement offered. Scheduled medications given to pt. Q 15 min checks continued for patient safety. R: Patient receptive. Patient remains safe on the unit.

## 2015-05-14 NOTE — Tx Team (Signed)
Interdisciplinary Treatment Plan Update (Adult)  Date:  05/14/2015 Time Reviewed:  1:37 PM  Progress in Treatment: Attending groups: Yes. Participating in groups:  Yes. however is disorganized and disruptive but redirectable Taking medication as prescribed:  Yes. with encouragement Tolerating medication:  Yes. Family/Significant othe contact made:  Yes, individual(s) contacted:  son in law Sherren Mocha [phone] Patient understands diagnosis:  No. Discussing patient identified problems/goals with staff:  Yes. Medical problems stabilized or resolved:  Yes. Denies suicidal/homicidal ideation: Yes. Issues/concerns per patient self-inventory:  No. Other:  New problem(s) identified: No, Describe:  none reported  Discharge Plan or Barriers: Patient is not able to return to [facility] and will need new placement but has PASRR and needs placed. Patient will also need MH followup arranged.   Reason for Continuation of Hospitalization: Mania  Comments:  Estimated length of stay: up to 7 days with expected discharge Tuesday 05/21/15  New goal(s):  Review of initial/current patient goals per problem list:   See Care Plan  Attendees: Physician:  Orson Slick, MD 9/13/20161:37 PM  Nursing:   Tami Lin, RN 9/13/20161:37 PM  Other:  Carmell Austria, LCSWA 9/13/20161:37 PM   Scribe for Treatment Team:   Keene Breath, MSW, [place]  05/14/2015, 1:37 PM

## 2015-05-14 NOTE — Progress Notes (Signed)
Recreation Therapy Notes  Date: 09.13.16 Time: 3:00 pm Location: Craft Room  Group Topic: Goal Setting  Goal Area(s) Addresses:  Patient will write at least one goal. Patient will write at least one obstacle.  Behavioral Response: Disruptive, Inappropriate, Left early  Intervention: Recovery Goal Chart  Activity: Patients were instructed to write goals, obstacles, the date they started working on their goals, and the date they achieved their goals.  Education: LRT educated patients on healthy ways they can celebrate reaching their goals.   Education Outcome: Patient left group before LRT educated patients.  Clinical Observations/Feedback: Patient arrived to group stating she was having a bad day. Patient started to walk away without her walker after washing her hands. LRT asked patient to get her walker. Patient became loud with LRT stating that medical professionals do not have the right to tell her if she needs a walker or not and that she knows if she needs to use a walker. Patient wrote down three goals. Her goals were "To get out of here" with the obstacle being "here", "To go to [place]" with the obstacle being "Burn it down", and "To kill myself" with no obstacle. Patient loudly stated she was suicidal. Patient stated she told Dr. Bary Leriche and Dr. Bary Leriche thought she was "faking it" and was "attention seeking". Patient was very disruptive. LRT attempted to redirect patient. Patient hard to redirect. Patient asked to leave group, but did not want to be "written up" and wanted to "get my points". LRT informed patient she could go to her room. Patient started to leave group without her walker. LRT brought her walker to her and patient started talking about medical professionals again. Patient left group at approximately 3:17 pm. Patient returned to group at approximately 3:23 pm stating she wanted LRT to give her goal chart to her counselor at [facility], [place]. LRT informed patient she did  not know who Gerald Stabs was. Patient wanted LRT to give the paper to someone. LRT gave paper to patient's nurse to be put in patient's chart. Patient left group at approximately 3:24 pm and did not return to group.  Kendra Nicholson, LRT/CTRS 05/14/2015 4:53 PM

## 2015-05-15 LAB — GLUCOSE, CAPILLARY
Glucose-Capillary: 121 mg/dL — ABNORMAL HIGH (ref 65–99)
Glucose-Capillary: 118 mg/dL — ABNORMAL HIGH (ref 65–99)
Glucose-Capillary: 107 mg/dL — ABNORMAL HIGH (ref 65–99)

## 2015-05-15 NOTE — Progress Notes (Signed)
D: Patient denies SI/HI/AVH.  Patient affect is irritable and her mood is anxious.  Patient became upset with staff at snack time.  Patient does not want assistance from staff in choosing options that are appropriate for a carb modified diet.  Patient states, "It's my body and I can have what I want.  I don't need you to tell me what to do."  Patient did attend evening group. Patient visible on the milieu.  A: Support and encouragement offered. Scheduled medications given to pt. Q 15 min checks continued for patient safety. R: Patient receptive. Patient remains safe on the unit.
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
## 2015-05-15 NOTE — Progress Notes (Signed)
Recreation Therapy Notes  Date: 09.14.16 Time: 3:15 pm Location: Craft Room  Group Topic: Self-esteem, coping skills  Goal Area(s) Addresses:  Patient will identify at least one positive trait about self. Patient will identify at least one healthy coping skill.  Behavioral Response: Did not attend  Intervention: All About Me  Activity: Patients were instructed to make an All About Me pamphlet listing their life's motto, positive traits, healthy coping skills, and their healthy support system.  Education: LRT educated patients on ways they can increase their self-esteem.  Education Outcome: Patient did not attend group.  Clinical Observations/Feedback: Patient did not attend group.  Leonette Monarch, LRT/CTRS 05/15/2015 4:33 PM

## 2015-05-15 NOTE — Progress Notes (Signed)
[facility] MD Progress Note  05/15/2015 12:08 PM LATICHA FERRUCCI  MRN:  [id number]  Subjective:  Ms. Baby is still paranoid, delusional, agitated, loud, and difficult to redirect. She did take Seroquel last night and accepted medications this morning. She eagerly participates in groups. She has no somatic complaints. Yesterday she was yelling "I'm suicidal suicidal." Sleep and appetite seem okay.  Principal Problem: PTSD (post-traumatic stress disorder) Diagnosis:   Patient Active Problem List   Diagnosis Date Noted  . PTSD (post-traumatic stress disorder) [F43.10] 05/09/2015  . Bipolar 2 disorder [F31.81] 05/09/2015  . Hypertension [I10] 05/09/2015  . Diabetes [E11.9] 05/09/2015   Total Time spent with patient: 20 minutes   Past Medical History:  Past Medical History  Diagnosis Date  . Anxiety   . Depression   . Hypertension   . Diabetes mellitus without complication    History reviewed. No pertinent past surgical history. Family History: History reviewed. No pertinent family history. Social History:  History  Alcohol Use No     History  Drug Use Not on file    Social History   Social History  . Marital Status: Divorced    Spouse Name: N/A  . Number of Children: N/A  . Years of Education: N/A   Social History Main Topics  . Smoking status: Never Smoker   . Smokeless tobacco: None  . Alcohol Use: No  . Drug Use: None  . Sexual Activity: Not Asked   Other Topics Concern  . None   Social History Narrative   Additional History:    Sleep: Good  Appetite:  Good   Assessment:   Musculoskeletal: Strength & Muscle Tone: within normal limits Gait & Station: normal Patient leans: N/A   Psychiatric Specialty Exam: Physical Exam  Nursing note and vitals reviewed.   Review of Systems  All other systems reviewed and are negative.   Blood pressure 158/80, pulse 82, temperature 98.2 F (36.8 C), temperature source Oral, resp. rate 19, height 5\' 6"  (1.676 m),  weight 78.472 kg (173 lb).Body mass index is 27.94 kg/(m^2).  General Appearance: Disheveled  Eye Contact::  Minimal  Speech:  Pressured  Volume:  Increased  Mood:  Angry, Dysphoric and Irritable  Affect:  Labile  Thought Process:  Goal Directed  Orientation:  Full (Time, Place, and Person)  Thought Content:  Delusions and Paranoid Ideation  Suicidal Thoughts:  No  Homicidal Thoughts:  No  Memory:  Immediate;   Fair Recent;   Fair Remote;   Fair  Judgement:  Impaired  Insight:  Lacking  Psychomotor Activity:  Increased  Concentration:  Fair  Recall:  [employer] of Knowledge:Fair  Language: Fair  Akathisia:  No  Handed:  Right  AIMS (if indicated):     Assets:  Communication Skills Desire for Improvement Financial Resources/Insurance  ADL's:  Intact  Cognition: WNL  Sleep:  Number of Hours: 7     Current Medications: Current Facility-Administered Medications  Medication Dose Route Frequency Provider Last Rate Last Dose  . acetaminophen (TYLENOL) tablet 650 mg  650 mg Oral Q6H PRN Gonzella Lex, MD   650 mg at 05/15/15 0903  . alum & mag hydroxide-simeth (MAALOX/MYLANTA) 200-200-20 MG/5ML suspension 30 mL  30 mL Oral Q4H PRN Gonzella Lex, MD      . amLODipine (NORVASC) tablet 10 mg  10 mg Oral Daily Gonzella Lex, MD   10 mg at 05/15/15 1037  . busPIRone (BUSPAR) tablet 15 mg  15 mg Oral  BID Gonzella Lex, MD   15 mg at 05/15/15 1038  . carbidopa-levodopa (SINEMET IR) 25-100 MG per tablet immediate release 1 tablet  1 tablet Oral TID Gonzella Lex, MD   1 tablet at 05/15/15 1038  . citalopram (CELEXA) tablet 40 mg  40 mg Oral Daily Gonzella Lex, MD   40 mg at 05/15/15 1037  . clotrimazole (LOTRIMIN) 1 % cream   Topical BID Gonzella Lex, MD      . estradiol (ESTRACE) tablet 1 mg  1 mg Oral Daily Gonzella Lex, MD   1 mg at 05/15/15 1037  . fluticasone (FLONASE) 50 MCG/ACT nasal spray 2 spray  2 spray Each Nare Daily Marjie Skiff, MD   2 spray at 05/15/15 1043   . glipiZIDE (GLUCOTROL XL) 24 hr tablet 5 mg  5 mg Oral Q breakfast Gonzella Lex, MD   5 mg at 05/15/15 0824  . magnesium hydroxide (MILK OF MAGNESIA) suspension 30 mL  30 mL Oral Daily PRN Gonzella Lex, MD      . metFORMIN (GLUCOPHAGE) tablet 1,000 mg  1,000 mg Oral BID WC Gonzella Lex, MD   1,000 mg at 05/15/15 0823  . metoprolol succinate (TOPROL-XL) 24 hr tablet 50 mg  50 mg Oral Daily Gonzella Lex, MD   50 mg at 05/15/15 1050  . pravastatin (PRAVACHOL) tablet 20 mg  20 mg Oral q1800 Gonzella Lex, MD   20 mg at 05/14/15 1728  . QUEtiapine (SEROQUEL) tablet 150 mg  150 mg Oral QHS Clovis Fredrickson, MD   150 mg at 05/14/15 2141  . tiZANidine (ZANAFLEX) tablet 2 mg  2 mg Oral Q6H PRN Gonzella Lex, MD   2 mg at 05/12/15 2230    Lab Results:  Results for orders placed or performed during the hospital encounter of 05/09/15 (from the past 48 hour(s))  Glucose, capillary     Status: Abnormal   Collection Time: 05/13/15  8:27 PM  Result Value Ref Range   Glucose-Capillary 123 (H) 65 - 99 mg/dL   Comment 1 Notify RN   Glucose, capillary     Status: Abnormal   Collection Time: 05/14/15  7:15 AM  Result Value Ref Range   Glucose-Capillary 140 (H) 65 - 99 mg/dL  Glucose, capillary     Status: Abnormal   Collection Time: 05/14/15 12:06 PM  Result Value Ref Range   Glucose-Capillary 182 (H) 65 - 99 mg/dL   Comment 1 Notify RN   Glucose, capillary     Status: Abnormal   Collection Time: 05/14/15  4:49 PM  Result Value Ref Range   Glucose-Capillary 138 (H) 65 - 99 mg/dL   Comment 1 Notify RN   Glucose, capillary     Status: Abnormal   Collection Time: 05/15/15  7:04 AM  Result Value Ref Range   Glucose-Capillary 121 (H) 65 - 99 mg/dL    Physical Findings: AIMS: Facial and Oral Movements Muscles of Facial Expression: None, normal Lips and Perioral Area: None, normal Jaw: None, normal Tongue: None, normal,Extremity Movements Upper (arms, wrists, hands, fingers): None,  normal Lower (legs, knees, ankles, toes): None, normal, Trunk Movements Neck, shoulders, hips: None, normal, Overall Severity Severity of abnormal movements (highest score from questions above): None, normal Incapacitation due to abnormal movements: None, normal Patient's awareness of abnormal movements (rate only patient's report): No Awareness, Dental Status Current problems with teeth and/or dentures?: No Does patient usually wear dentures?: No  CIWA:    COWS:     Treatment Plan Summary: Daily contact with patient to assess and evaluate symptoms and progress in treatment and Medication management   Medical Decision Making:  Established Problem, Stable/Improving (1), Review of Psycho-Social Stressors (1), Review or order clinical lab tests (1), Review of Medication Regimen & Side Effects (2) and Review of New Medication or Change in Dosage (2)   Ms. Paulsen is a 64 year old female with a history of PTSD admitted for agitated behavior at the assisted living facility.   1. Agitation. The patient continues to be loud, intrusive, difficult to redirect   2. PTSD. The patient has been maintained in the community on the combination of BuSpar, Celexa and doxepin.   3. Mood. Her presentation is in keeping with dysphoric mania. We started Seroquel to address her paranoia and delusions and stabilize her mood.  3. Diabetes. We'll continue Glucotrol and metformin with ADA diet and lack glucose monitoring.   4. Hypertension. We continue Norvasc and metoprolol.  5. Dyslipidemia. We'll continue Pravachol.  6. Parkinson disease. We'll continue Sinemet.  7. Chronic pain. We'll continue Zanaflex.  8. Disposition. The patient is not allowed to return to assisted living facility. She needs placement. PPD ordered. She has PASSR already.     Onalee Steinbach 05/15/2015, 12:08 PM

## 2015-05-15 NOTE — BHH Group Notes (Signed)
[facility] Group Notes:  (Nursing/MHT/Case Management/Adjunct)  Date:  05/15/2015  Time:  12:17 PM  Type of Therapy:  Group Therapy  Participation Level:  Did Not Attend  Participation QualitySummary of Progress/Problems:  [name] 05/15/2015, 12:17 PM

## 2015-05-16 LAB — GLUCOSE, CAPILLARY
Glucose-Capillary: 96 mg/dL (ref 65–99)
Glucose-Capillary: 122 mg/dL — ABNORMAL HIGH (ref 65–99)

## 2015-05-16 NOTE — Progress Notes (Addendum)
D: Patient denies SI/HI/AVH.  Patient affect is labile and her mood is anxious. Patient complained of back pain.  PRN Tylenol was given. Patient did attend evening group. Patient visible on the milieu. No distress noted. A: Support and encouragement offered. Scheduled medications given to pt. Q 15 min checks continued for patient safety. R: Patient receptive. Patient remains safe on the unit.

## 2015-05-16 NOTE — Progress Notes (Signed)
Patient has a brighter affect and was smiling, laughing, engages in positive and polite conversations with nurse and peers. She states, "I'm beginning to feel much better because I'm being listened to".
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
## 2015-05-16 NOTE — Plan of Care (Signed)
Problem: Ineffective individual coping Goal: STG:Pt. will utilize relaxation techniques to reduce stress STG: Patient will utilize relaxation techniques to reduce stress levels  Outcome: Progressing Patient has requested word searches and coloring sheets as her coping mechanism for stress and anxiety.

## 2015-05-16 NOTE — Progress Notes (Signed)
Recreation Therapy Notes  Date: 09.15.16 Time: 3:00 pm Location: Craft Room  Group Topic: Leisure Education, Coping skills  Goal Area(s) Addresses:  Patient will identify things they are grateful. Patient will identify how being grateful can influence decision making.  Behavioral Response: Attentive, Interactive, Disruptive, Inappropriate  Intervention: Grateful Wheel  Activity: Patients were given a I Am Grateful For worksheet and instructed to write at least one thing they are grateful for under each category.  Education:LRT educated patients on ways they can integrate leisure into their schedules.  Education Outcome: In group clarification offered  Clinical Observations/Feedback: Patient wrote things as "Good and Bad" and "No more Ignorance" on her worksheet. Patient contributed to group discussion by stating something she is thankful for. Patient was asked about if [place] was conservative. LRT redirected patient. Patient continued to get off track during group. LRT redirected patient.  Leonette Monarch, LRT/CTRS 05/16/2015 4:37 PM

## 2015-05-16 NOTE — Tx Team (Signed)
Interdisciplinary Treatment Plan Update (Adult)  Date:  05/16/2015 Time Reviewed:  2:22 PM  Progress in Treatment: Attending groups: Yes. Participating in groups:  Yes. however is disorganized and disruptive but redirectable Taking medication as prescribed:  Yes. with encouragement Tolerating medication:  Yes. Family/Significant othe contact made:  Yes, individual(s) contacted:  son in law Sherren Mocha [phone] Patient understands diagnosis:  No. Discussing patient identified problems/goals with staff:  Yes. Medical problems stabilized or resolved:  Yes. Denies suicidal/homicidal ideation: Yes. Issues/concerns per patient self-inventory:  No. Other:  New problem(s) identified: No, Describe:  none reported  Discharge Plan or Barriers: Patient is not able to return to [facility] and will need new placement but has PASRR and needs placed. Patient will also need MH followup arranged.   Reason for Continuation of Hospitalization: Mania  Comments:  Estimated length of stay: up to 7 days with expected discharge Thursday 05/23/15  New goal(s):  Review of initial/current patient goals per problem list:   See Care Plan  Attendees: Physician:  Orson Slick, MD 9/15/20162:22 PM  Nursing:   Elige Radon, RN 9/15/20162:22 PM  Other:  Carmell Austria, LCSWA 9/15/20162:22 PM   Scribe for Treatment Team:   Keene Breath, MSW, [place]  05/16/2015, 2:22 PM

## 2015-05-16 NOTE — BHH Group Notes (Signed)
[place] LCSW Group Therapy  05/16/2015 12:19 PM  Type of Therapy:  Group Therapy  Participation Level:  Active  Participation Quality:  Appropriate  Affect:  Appropriate  Cognitive:  Appropriate  Insight:  Developing/Improving  Engagement in Therapy:  Developing/Improving and Supportive  Modes of Intervention:  Discussion, Education and Support  Summary of Progress/Problems:LCSW introduced group rules. Patients were encouraged to share obstacles they face and how they cope. The second half of group was how patients find balance in their lives. This patient although arrived late was very supportive to her peers.  [place] 05/16/2015, 12:19 PM

## 2015-05-16 NOTE — Progress Notes (Signed)
D- Patient denies SI/HI/AVH and mantains a labile affect and anxious mood. She continues to say , "I am not receiving good care here"  but continues to be compliant with her medications and attends groups. However, after the 11am group patients seen on the unit smiling and laughing with a peer. Complains of back pain rated a 7/10 and requested an increase in her Tylenol dosage or a change in pain medication to have adequate pain relief.  A- Given Tylenol 650 mg for back pain and was encouraged to stay active on the unit and continue cooperating with the plan of care.  R- Patient receptive to care however remains distrustful of staff. Remains safe on the unit.

## 2015-05-17 LAB — GLUCOSE, CAPILLARY
Glucose-Capillary: 114 mg/dL — ABNORMAL HIGH (ref 65–99)
Glucose-Capillary: 157 mg/dL — ABNORMAL HIGH (ref 65–99)
Glucose-Capillary: 145 mg/dL — ABNORMAL HIGH (ref 65–99)
Glucose-Capillary: 188 mg/dL — ABNORMAL HIGH (ref 65–99)

## 2015-05-17 NOTE — Plan of Care (Signed)
Problem: Consults Goal: Aggression Patient Education See Patient Education Module for education specifics.  Outcome: Progressing Pt is still working on the goal.

## 2015-05-17 NOTE — Progress Notes (Signed)
D: Pt is awake and active in the milieu this evening. Pt mood is appropriate and her affect is flat. Pt endorses passive SI but does contract for safety. Pt openly discusses current circumstances and is cooperative with staff. Pt also acknowledges having difficulty working with female staff.    A: Writer provided emotional support and administered medications as prescribed. Writer established good rapport and promoted positive coping skills.  R: Pt behavior is appropriate on the unit and she is pleasant and cooperative with staff. Pt went to bed following medication administration and evening snack.

## 2015-05-17 NOTE — BHH Group Notes (Signed)
[facility] Group Notes:  (Nursing/MHT/Case Management/Adjunct)  Date:  05/17/2015  Time:  11:43 AM  Type of Therapy:  Psychoeducational Skills  Participation Level:  Did Not Attend    [name] 05/17/2015, 11:43 AM
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
## 2015-05-17 NOTE — Progress Notes (Signed)
Recreation Therapy Notes  Date: 09.16.16 Time: 3:00 pm Location: Craft Room  Group Topic: Coping Skills  Goal Area(s) Addresses:  Patient will participate in healthy coping skill. Patient will verbalize at least one emotion experienced during group.  Behavioral Response: Attentive, Left early  Intervention: Coloring  Activity: Patients were instructed to color either mandalas or other coloring sheets and focus on the emotions they were experiencing.  Education: LRT educated patients on different coping skills.  Education Outcome: Patient left before LRT educated group.  Clinical Observations/Feedback: Patient colored coloring sheet. Patient left group at approximately 3:47 pm stating it was too hot. Patient did not return to group.  Leonette Monarch, LRT/CTRS 05/17/2015 4:47 PM

## 2015-05-17 NOTE — Plan of Care (Signed)
Problem: Aggression Towards others,Towards Self, and or Destruction Goal: STG-Patient will identify triggers Outcome: Progressing Pt acknowledges inapropriate behavior and difficulty working with female staff, but is pleasant and cooperative this evening.

## 2015-05-17 NOTE — BHH Group Notes (Signed)
[facility] LCSW Aftercare Discharge Planning Group Note   05/17/2015 1:09 PM  Participation Quality: Did not attend.   Wray Kearns, MSW, [place]

## 2015-05-17 NOTE — Progress Notes (Signed)
NUTRITION NOTE:   Kendra Nicholson is a 64 y.o. female with PTSD (post-traumatic stress disorder)  PMH:  Past Medical History  Diagnosis Date  . Anxiety   . Depression   . Hypertension   . Diabetes mellitus without complication     Diet Order: Carb Modified  Current Nutrition: pt eating 80-100% of meal trays  Anthropometrics:  Body mass index is 27.94 kg/(m^2).  Glucose Profile: HgA1c 5.9   Recent Labs  05/16/15 1652 05/17/15 0630 05/17/15 1141  GLUCAP 122* 114* 157*   Meds: glucophage  Pt is at no nutrition risk due to diagnosis/current problem, BMI of 28, diabetic diet order, 80-100% po intake. No nutrition intervention warranted at this time. Will sign off. Please re-consult RD if nutritional issues arise.   Kerman Passey Raymond, [place], LDN [phone] Pager

## 2015-05-18 LAB — GLUCOSE, CAPILLARY
Glucose-Capillary: 95 mg/dL (ref 65–99)
Glucose-Capillary: 128 mg/dL — ABNORMAL HIGH (ref 65–99)
Glucose-Capillary: 113 mg/dL — ABNORMAL HIGH (ref 65–99)

## 2015-05-18 MED ORDER — TUBERCULIN PPD 5 UNIT/0.1ML ID SOLN
5.0000 [IU] | Freq: Once | INTRADERMAL | Status: AC
  Administered 2015-05-18: 5 [IU] via INTRADERMAL
  Filled 2015-05-18: qty 0.1

## 2015-05-18 NOTE — Progress Notes (Signed)
Pt slept 7.15hrs, monitored for safety.
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
## 2015-05-18 NOTE — Progress Notes (Signed)
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 She continues to have pressured speech and was talking about her diet in detail. She reported that she is not getting enough food and was talking about getting a meat sauce tomorrow and  there will be in not enough protein for her. It was difficult to redirect her. However she has been sitting in the chair and was advised that she can talk to the dietitian in detail about her food intake and she agreed with the plan.  She report improvement. She accepts Seroquel now and tolerates it well. She complains of fibromyalgia pain and use a walker. Sleep and appetite are fair. She eagely participates in programing.  Principal Problem: PTSD (post-traumatic stress disorder) Diagnosis:   Patient Active Problem List   Diagnosis Date Noted  . PTSD (post-traumatic stress disorder) [F43.10] 05/09/2015  . Bipolar 2 disorder [F31.81] 05/09/2015  . Hypertension [I10] 05/09/2015  . Diabetes [E11.9] 05/09/2015   Total Time spent with patient: 20 minutes   Past Medical History:  Past Medical History  Diagnosis Date  . Anxiety   . Depression   . Hypertension   . Diabetes mellitus without complication    History reviewed. No pertinent past surgical history. Family History: History reviewed. No pertinent family history. Social History:  History  Alcohol Use No     History  Drug Use Not on file    Social History   Social History  . Marital Status: Divorced    Spouse Name: N/A  . Number of Children: N/A  . Years of Education: N/A   Social History Main Topics  . Smoking status: Never Smoker   . Smokeless tobacco: None  . Alcohol Use: No  . Drug Use: None  . Sexual Activity: Not Asked    Other Topics Concern  . None   Social History Narrative   Additional History:    Sleep: Good  Appetite:  Good   Assessment:   Musculoskeletal: Strength & Muscle Tone: within normal limits Gait & Station: normal Patient leans: N/A   Psychiatric Specialty Exam: Physical Exam  Nursing note and vitals reviewed.   Review of Systems  Musculoskeletal: Positive for myalgias.  Psychiatric/Behavioral: The patient is nervous/anxious and has insomnia.   All other systems reviewed and are negative.   Blood pressure 138/78, pulse 88, temperature 97.5 F (36.4 C), temperature source Oral, resp. rate 20, height 5\' 6"  (1.676 m), weight 173 lb (78.472 kg).Body mass index is 27.94 kg/(m^2).  General Appearance: Disheveled  Eye Contact::  Good  Speech:  Pressured  Volume:  Increased  Mood:  Angry, Dysphoric and Irritable  Affect:  Labile  Thought Process:  Goal Directed  Orientation:  Full (Time, Place, and Person)  Thought Content:  Delusions and Paranoid Ideation  Suicidal Thoughts:  No  Homicidal Thoughts:  No  Memory:  Immediate;   Fair Recent;   Fair Remote;   Fair  Judgement:  Impaired  Insight:  Lacking  Psychomotor Activity:  Normal  Concentration:  Fair  Recall:  [employer] of Knowledge:Fair  Language: Good  Akathisia:  No  Handed:  Right  AIMS (if indicated):     Assets:  Communication Skills Desire for Improvement Financial Resources/Insurance  Physical Health Resilience Social Support  ADL's:  Intact  Cognition: WNL  Sleep:  Number of Hours: 7.15     Current Medications: Current Facility-Administered Medications  Medication Dose Route Frequency Provider Last Rate Last Dose  . acetaminophen (TYLENOL) tablet 650 mg  650 mg Oral Q6H PRN Gonzella Lex, MD   650 mg at 05/18/15 0748  . alum & mag hydroxide-simeth (MAALOX/MYLANTA) 200-200-20 MG/5ML suspension 30 mL  30 mL Oral Q4H PRN Gonzella Lex, MD      . amLODipine (NORVASC) tablet 10 mg  10 mg Oral  Daily Gonzella Lex, MD   10 mg at 05/18/15 0917  . busPIRone (BUSPAR) tablet 15 mg  15 mg Oral BID Gonzella Lex, MD   15 mg at 05/18/15 0917  . carbidopa-levodopa (SINEMET IR) 25-100 MG per tablet immediate release 1 tablet  1 tablet Oral TID Gonzella Lex, MD   1 tablet at 05/18/15 0917  . citalopram (CELEXA) tablet 40 mg  40 mg Oral Daily Gonzella Lex, MD   40 mg at 05/18/15 0916  . clotrimazole (LOTRIMIN) 1 % cream   Topical BID Gonzella Lex, MD      . estradiol (ESTRACE) tablet 1 mg  1 mg Oral Daily Gonzella Lex, MD   1 mg at 05/18/15 0916  . fluticasone (FLONASE) 50 MCG/ACT nasal spray 2 spray  2 spray Each Nare Daily Marjie Skiff, MD   2 spray at 05/18/15 0919  . glipiZIDE (GLUCOTROL XL) 24 hr tablet 5 mg  5 mg Oral Q breakfast Gonzella Lex, MD   5 mg at 05/18/15 0747  . magnesium hydroxide (MILK OF MAGNESIA) suspension 30 mL  30 mL Oral Daily PRN Gonzella Lex, MD      . metFORMIN (GLUCOPHAGE) tablet 1,000 mg  1,000 mg Oral BID WC Gonzella Lex, MD   1,000 mg at 05/18/15 0747  . metoprolol succinate (TOPROL-XL) 24 hr tablet 50 mg  50 mg Oral Daily Gonzella Lex, MD   50 mg at 05/18/15 0917  . pravastatin (PRAVACHOL) tablet 20 mg  20 mg Oral q1800 Gonzella Lex, MD   20 mg at 05/17/15 1630  . QUEtiapine (SEROQUEL) tablet 150 mg  150 mg Oral QHS Jolanta B Pucilowska, MD   150 mg at 05/17/15 2210  . tiZANidine (ZANAFLEX) tablet 2 mg  2 mg Oral Q6H PRN Gonzella Lex, MD   2 mg at 05/18/15 0920  . tuberculin injection 5 Units  5 Units Intradermal Once Clovis Fredrickson, MD   5 Units at 05/18/15 0943    Lab Results:  Results for orders placed or performed during the hospital encounter of 05/09/15 (from the past 48 hour(s))  Glucose, capillary     Status: Abnormal   Collection Time: 05/16/15  4:52 PM  Result Value Ref Range   Glucose-Capillary 122 (H) 65 - 99 mg/dL  Glucose, capillary     Status: Abnormal   Collection Time: 05/17/15  6:30 AM  Result Value Ref Range    Glucose-Capillary 114 (H) 65 - 99 mg/dL  Glucose, capillary     Status: Abnormal   Collection Time: 05/17/15 11:41 AM  Result Value Ref Range   Glucose-Capillary 157 (H) 65 - 99 mg/dL  Glucose, capillary     Status: Abnormal   Collection Time: 05/17/15  4:25 PM  Result Value Ref Range   Glucose-Capillary 145 (H) 65 - 99 mg/dL   Comment 1  Notify RN   Glucose, capillary     Status: Abnormal   Collection Time: 05/17/15 11:13 PM  Result Value Ref Range   Glucose-Capillary 188 (H) 65 - 99 mg/dL  Glucose, capillary     Status: None   Collection Time: 05/18/15  6:46 AM  Result Value Ref Range   Glucose-Capillary 95 65 - 99 mg/dL   Comment 1 Notify RN    Comment 2 Document in Chart   Glucose, capillary     Status: Abnormal   Collection Time: 05/18/15 12:01 PM  Result Value Ref Range   Glucose-Capillary 128 (H) 65 - 99 mg/dL    Physical Findings: AIMS: Facial and Oral Movements Muscles of Facial Expression: None, normal Lips and Perioral Area: None, normal Jaw: None, normal Tongue: None, normal,Extremity Movements Upper (arms, wrists, hands, fingers): None, normal Lower (legs, knees, ankles, toes): None, normal, Trunk Movements Neck, shoulders, hips: None, normal, Overall Severity Severity of abnormal movements (highest score from questions above): None, normal Incapacitation due to abnormal movements: None, normal Patient's awareness of abnormal movements (rate only patient's report): No Awareness, Dental Status Current problems with teeth and/or dentures?: No Does patient usually wear dentures?: No  CIWA:  CIWA-Ar Total: 4 COWS:     Treatment Plan Summary: Daily contact with patient to assess and evaluate symptoms and progress in treatment and Medication management   Medical Decision Making:  Established Problem, Stable/Improving (1), Review of Psycho-Social Stressors (1), Review or order clinical lab tests (1), Review of Medication Regimen & Side Effects (2) and Review of  New Medication or Change in Dosage (2)   Ms. Brodzinski is a 64 year old female with a history of PTSD admitted for agitated behavior at the assisted living facility.   1. Agitation. Has pressured speech  2. PTSD. The patient has been maintained on the combination of BuSpar, Celexa and doxepin.   3. Mood. We'll continue on Seroquel  3. Diabetes. We'll continue Glucotrol and metformin with ADA diet and lack glucose monitoring.   4. Hypertension. We continue Norvasc and metoprolol.  5. Dyslipidemia. We'll continue Pravachol.  6. Parkinson disease. We'll continue Sinemet.  7. Chronic pain. We'll continue Zanaflex.  8. Disposition. The patient is not allowed to return to assisted living facility. She needs placement. PPD ordered. She has PASSR already.      Rainey Pines 05/18/2015, 3:23 PM

## 2015-05-18 NOTE — Progress Notes (Addendum)
Patient with irritable affect, resistant behavior. Poor adls, good appetite. Social with female peer. Patient reluctant to education during med pass. Speaks over Probation officer, upset about order for PPD. Writer initiated a few interactions during the morning and requested if the patient was able to listen and then patient verbalizes understanding and PPD is administered as ordered to right forearm at 0944. No SI/HI at this time. Safety maintained. Tylenol and Zanaflex administered for chronic pain with good effect. Blood glucose monitored and recorded. No s/s of hypo/hyperglycemia.

## 2015-05-18 NOTE — Progress Notes (Signed)
Patient requests Tylenol prn for arthritis and fibromyalgia discomfort. Writer to med room with patient to administer meds. Another patient present and requesting her 0700 med (she requested first). Peer med administered. Patient c/o to peer how many times she has reported and complained about her rights.  Writer administers 0700 meds and Tylenol 650 mg po prn for pain 10/10. Patient takes meds from cup and has in hand. Writer attempts to discuss PPD order written. Patient makes fists and is flailing arms around and becomes tangential stating how she is "not taking an injection that no one ordered". Writer listens to patient and then encourages patient to take meds before they fall or are thrown to floor. Patient remains tangential. Takes meds, remains irritable. Ambulates with walker, steady gait to dayroom for am meal. Safety maintained.

## 2015-05-18 NOTE — BHH Group Notes (Addendum)
[place] LCSW Group Therapy  05/18/2015 3:34 PM  Type of Therapy:  Group Therapy  Participation Level:  Minimal  Participation Quality:  Intrusive and Inattentive  Affect:  Labile  Cognitive:  Disorganized  Insight:  Limited  Engagement in Therapy:  Limited  Modes of Intervention:  Discussion, Education, Socialization and Support  Summary of Progress/Problems:Balance in life: Patients will discuss the concept of balance and how it looks and feels to be unbalanced. Pt will identify areas in their life that is unbalanced and ways to become more balanced.  Kendra Nicholson attended group and stayed the entire time. She continuously interpreted group to discuss her rights as a patient. She wanted to discuss complaints such as the rash in her genital area due to the underwear provided here and her diet.  [place] MSW, [place]  05/18/2015, 3:34 PM

## 2015-05-18 NOTE — Progress Notes (Signed)
Patient presents to verbalize that her "Sunday menu choice has no meat on it". Tech spoke with patient and told patient pasta sauce has meat in it. Writer attempted to educate patient that she can pick items from additional dietary menu. Patient refused to listen. Safety maintained.

## 2015-05-18 NOTE — Plan of Care (Signed)
Problem: Ineffective individual coping Goal: STG: Patient will remain free from self harm Outcome: Progressing No SI/SH at this time.

## 2015-05-18 NOTE — BHH Group Notes (Signed)
[place] Group Notes:  (Nursing/MHT/Case Management/Adjunct)  Date:  05/18/2015  Time:  9:37 PM  Type of Therapy:  Evening Wrap-up Group  Participation Level:  Did Not Attend  Participation Quality:  N/A  Affect:  N/A  Cognitive:  N/A  Insight:  None  Engagement in Group:  N/A  Modes of Intervention:  Activity  Summary of Progress/Problems:  Levonne Spiller 05/18/2015, 9:37 PM

## 2015-05-19 LAB — GLUCOSE, CAPILLARY
Glucose-Capillary: 107 mg/dL — ABNORMAL HIGH (ref 65–99)
Glucose-Capillary: 145 mg/dL — ABNORMAL HIGH (ref 65–99)

## 2015-05-19 NOTE — BHH Group Notes (Signed)
[facility] LCSW Group Therapy  05/19/2015 2:22 PM  Type of Therapy:  Group Therapy  Participation Level:  Minimal  Participation Quality:  Inattentive  Affect:  Irritable  Cognitive:  Disorganized  Insight:  Limited  Engagement in Therapy:  Limited  Modes of Intervention:  Discussion, Education, Socialization and Support  Summary of Progress/Problems: Pt will learn how to identify obstacles, self-sabotaging and enabling behaviors, what they are and why we do them. Pt will discuss unhealthy relationships and how to have positive healthy boundaries with those that sabotage and enable. Pt will identify aspects of self sabotage and enabling in themselves and how to limit these behaviors. Rayelynn attended group and stayed the entire time. She had difficulties staying on topic.   [place] MSW, [place]  05/19/2015, 2:22 PM
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
## 2015-05-19 NOTE — Progress Notes (Signed)
D: Pt is awake and active in the milieu this evening. Pt mood is appropriate and her affect is bright. Pt does endorse passive SI at this time, but contracts for safety.   A: Writer provided emotional support and administered medications as prescribed.   R: Pt is behavior is appropriate on the unit and she is pleasant and cooperative with staff. She went to bed early, following medication administration.

## 2015-05-19 NOTE — Plan of Care (Signed)
Problem: Consults Goal: Aggression Patient Education See Patient Education Module for education specifics.  Outcome: Not Progressing Patient irritable and verbally aggressive rt Probation officer during med pass. Patient sitting in med room, writer pulling meds. Patient requests med room counter be wiped down after a peer received meds. Another nurse wipes counter down while writer pulling and scanning meds. Patient Tour manager of not following rules. Becomes irate and while holding meds in cup in hand waving arms around over her head and over garbage pail.

## 2015-05-19 NOTE — Plan of Care (Signed)
Problem: Ineffective individual coping Goal: STG: Pt will be able to identify effective and ineffective STG: Pt will be able to identify effective and ineffective coping patterns  Outcome: Progressing Actively participating in plan of care.

## 2015-05-19 NOTE — Progress Notes (Signed)
Patient verbally aggressive to writer during am med pass. While writer pulling and scanning meds, female patient at med counter receiving meds. Patient requests counter to be wiped clean following peer standing there. Another nurse wipes counter down. Writer hands patient meds and she Tour manager of not following the rules. Meds in cup while arm is raised up and patient is waving her arm around over head and garbage pail. Patient encouraged to take meds. Firm limit set with fair result.

## 2015-05-20 LAB — GLUCOSE, CAPILLARY
Glucose-Capillary: 104 mg/dL — ABNORMAL HIGH (ref 65–99)
Glucose-Capillary: 129 mg/dL — ABNORMAL HIGH (ref 65–99)

## 2015-05-20 NOTE — Plan of Care (Signed)
Problem: Alteration in mood; excessive anxiety as evidenced by: Goal: LTG-Patient's behavior demonstrates decreased anxiety (Patient's behavior demonstrates anxiety and he/she is utilizing learned coping skills to deal with anxiety-producing situations)  Outcome: Progressing Patient only got upset with one staff member verses the unit .

## 2015-05-20 NOTE — [facility] Group Notes (Signed)
[place] Group Notes:  (Nursing/MHT/Case Management/Adjunct)  Date:  05/20/2015  Time:  2:19 PM  Type of Therapy:  Psychoeducational Skills  Participation Level:  Active  Participation Quality:  Appropriate, Attentive and Sharing  Affect:  Appropriate  Cognitive:  Appropriate  Insight:  Appropriate and Good  Engagement in Group:  Engaged  Modes of Intervention:  Discussion, Education and Support  Summary of Progress/Problems:  Kendra Nicholson 05/20/2015, 2:19 PM

## 2015-05-20 NOTE — Progress Notes (Signed)
[facility] MD Progress Note  05/20/2015 11:39 AM Kendra Nicholson  MRN:  [id number]  Subjective: Kendra Nicholson reports further improvement. She feels more in control of her behavior. She accepts medications with no problems. She participates in groups. She has been able to have a discussion about her disposition. She describes that she is homeless as she is not allowed to return to her assisted living facility. She agrees to placement in another facility. She is pleasant and conversational today.  Per nursing report the patient gets agitated in the afternoon. She has no somatic complaints. She uses a walker on the unit.  Principal Problem: PTSD (post-traumatic stress disorder) Diagnosis:   Patient Active Problem List   Diagnosis Date Noted  . PTSD (post-traumatic stress disorder) [F43.10] 05/09/2015  . Bipolar 2 disorder [F31.81] 05/09/2015  . Hypertension [I10] 05/09/2015  . Diabetes [E11.9] 05/09/2015   Total Time spent with patient: 20 minutes   Past Medical History:  Past Medical History  Diagnosis Date  . Anxiety   . Depression   . Hypertension   . Diabetes mellitus without complication    History reviewed. No pertinent past surgical history. Family History: History reviewed. No pertinent family history. Social History:  History  Alcohol Use No     History  Drug Use Not on file    Social History   Social History  . Marital Status: Divorced    Spouse Name: N/A  . Number of Children: N/A  . Years of Education: N/A   Social History Main Topics  . Smoking status: Never Smoker   . Smokeless tobacco: None  . Alcohol Use: No  . Drug Use: None  . Sexual Activity: Not Asked   Other Topics Concern  . None   Social History Narrative   Additional History:    Sleep: Good  Appetite:  Good   Assessment:   Musculoskeletal: Strength & Muscle Tone: within normal limits Gait & Station: normal Patient leans: N/A   Psychiatric Specialty Exam: Physical Exam  Nursing note  and vitals reviewed.   Review of Systems  Musculoskeletal: Positive for myalgias.  All other systems reviewed and are negative.   Blood pressure 150/70, pulse 84, temperature 97.5 F (36.4 C), temperature source Oral, resp. rate 20, height 5\' 6"  (1.676 m), weight 78.472 kg (173 lb), SpO2 99 %.Body mass index is 27.94 kg/(m^2).  General Appearance: Casual  Eye Contact::  Good  Speech:  Normal Rate  Volume:  Increased  Mood:  Dysphoric  Affect:  Labile  Thought Process:  Goal Directed  Orientation:  Full (Time, Place, and Person)  Thought Content:  Delusions and Paranoid Ideation  Suicidal Thoughts:  No  Homicidal Thoughts:  No  Memory:  Immediate;   Fair Recent;   Fair Remote;   Fair  Judgement:  Impaired  Insight:  Shallow  Psychomotor Activity:  Normal  Concentration:  Fair  Recall:  [place]  Language: Fair  Akathisia:  No  Handed:  Right  AIMS (if indicated):     Assets:  Communication Skills Desire for Improvement Financial Resources/Insurance Resilience Social Support  ADL's:  Intact  Cognition: WNL  Sleep:  Number of Hours: 7.75     Current Medications: Current Facility-Administered Medications  Medication Dose Route Frequency Provider Last Rate Last Dose  . acetaminophen (TYLENOL) tablet 650 mg  650 mg Oral Q6H PRN Gonzella Lex, MD   650 mg at 05/20/15 6644  . alum & mag hydroxide-simeth (MAALOX/MYLANTA) 200-200-20 MG/5ML suspension  30 mL  30 mL Oral Q4H PRN Gonzella Lex, MD      . amLODipine (NORVASC) tablet 10 mg  10 mg Oral Daily Gonzella Lex, MD   10 mg at 05/20/15 0919  . busPIRone (BUSPAR) tablet 15 mg  15 mg Oral BID Gonzella Lex, MD   15 mg at 05/20/15 0920  . carbidopa-levodopa (SINEMET IR) 25-100 MG per tablet immediate release 1 tablet  1 tablet Oral TID Gonzella Lex, MD   1 tablet at 05/20/15 0919  . citalopram (CELEXA) tablet 40 mg  40 mg Oral Daily Gonzella Lex, MD   40 mg at 05/20/15 0920  . clotrimazole  (LOTRIMIN) 1 % cream   Topical BID Gonzella Lex, MD      . estradiol (ESTRACE) tablet 1 mg  1 mg Oral Daily Gonzella Lex, MD   1 mg at 05/20/15 1032  . fluticasone (FLONASE) 50 MCG/ACT nasal spray 2 spray  2 spray Each Nare Daily Marjie Skiff, MD   2 spray at 05/20/15 [phone]  . glipiZIDE (GLUCOTROL XL) 24 hr tablet 5 mg  5 mg Oral Q breakfast Gonzella Lex, MD   5 mg at 05/20/15 0806  . magnesium hydroxide (MILK OF MAGNESIA) suspension 30 mL  30 mL Oral Daily PRN Gonzella Lex, MD      . metFORMIN (GLUCOPHAGE) tablet 1,000 mg  1,000 mg Oral BID WC Gonzella Lex, MD   1,000 mg at 05/20/15 0806  . metoprolol succinate (TOPROL-XL) 24 hr tablet 50 mg  50 mg Oral Daily Gonzella Lex, MD   50 mg at 05/20/15 0918  . pravastatin (PRAVACHOL) tablet 20 mg  20 mg Oral q1800 Gonzella Lex, MD   20 mg at 05/19/15 1548  . QUEtiapine (SEROQUEL) tablet 150 mg  150 mg Oral QHS Clovis Fredrickson, MD   150 mg at 05/19/15 2108  . tiZANidine (ZANAFLEX) tablet 2 mg  2 mg Oral Q6H PRN Gonzella Lex, MD   2 mg at 05/20/15 6314    Lab Results:  Results for orders placed or performed during the hospital encounter of 05/09/15 (from the past 48 hour(s))  Glucose, capillary     Status: Abnormal   Collection Time: 05/18/15 12:01 PM  Result Value Ref Range   Glucose-Capillary 128 (H) 65 - 99 mg/dL  Glucose, capillary     Status: Abnormal   Collection Time: 05/18/15  4:50 PM  Result Value Ref Range   Glucose-Capillary 113 (H) 65 - 99 mg/dL   Comment 1 Notify RN   Glucose, capillary     Status: Abnormal   Collection Time: 05/19/15  6:48 AM  Result Value Ref Range   Glucose-Capillary 107 (H) 65 - 99 mg/dL   Comment 1 Notify RN    Comment 2 Document in Chart   Glucose, capillary     Status: Abnormal   Collection Time: 05/19/15 10:40 AM  Result Value Ref Range   Glucose-Capillary 145 (H) 65 - 99 mg/dL  Glucose, capillary     Status: Abnormal   Collection Time: 05/20/15  6:37 AM  Result Value Ref Range    Glucose-Capillary 104 (H) 65 - 99 mg/dL    Physical Findings: AIMS: Facial and Oral Movements Muscles of Facial Expression: None, normal Lips and Perioral Area: None, normal Jaw: None, normal Tongue: None, normal,Extremity Movements Upper (arms, wrists, hands, fingers): None, normal Lower (legs, knees, ankles, toes): None, normal, Trunk Movements Neck,  shoulders, hips: None, normal, Overall Severity Severity of abnormal movements (highest score from questions above): None, normal Incapacitation due to abnormal movements: None, normal Patient's awareness of abnormal movements (rate only patient's report): No Awareness, Dental Status Current problems with teeth and/or dentures?: No Does patient usually wear dentures?: No  CIWA:  CIWA-Ar Total: 4 COWS:     Treatment Plan Summary: Daily contact with patient to assess and evaluate symptoms and progress in treatment and Medication management   Medical Decision Making:  Established Problem, Stable/Improving (1), Review of Psycho-Social Stressors (1), Review or order clinical lab tests (1), Review of Medication Regimen & Side Effects (2) and Review of New Medication or Change in Dosage (2)   Kendra Nicholson is a 64 year old female with a history of PTSD admitted for agitated behavior at the assisted living facility.   1. Agitation. Has pressured speech  2. PTSD. The patient has been maintained on the combination of BuSpar, Celexa and doxepin.   3. Mood. Continue on Seroquel  3. Diabetes. We'll continue Glucotrol and metformin with ADA diet and lack glucose monitoring.   4. Hypertension. We continue Norvasc and metoprolol.  5. Dyslipidemia. We'll continue Pravachol.  6. Parkinson disease. We'll continue Sinemet.  7. Chronic pain. We'll continue Zanaflex.  8. Disposition. The patient is not allowed to return to assisted living facility. She needs placement. PPD ordered. She has PASSR already.      Jolanta Pucilowska 05/20/2015,  11:39 AM
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
## 2015-05-20 NOTE — Plan of Care (Signed)
Problem: Alteration in mood; excessive anxiety as evidenced by: Goal: LTG-Patient's behavior demonstrates decreased anxiety (Patient's behavior demonstrates anxiety and he/she is utilizing learned coping skills to deal with anxiety-producing situations)  Outcome: Progressing Pt is interacting appropriately with Probation officer and has an active sense of humour.
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
## 2015-05-20 NOTE — Progress Notes (Signed)
Recreation Therapy Notes  Date: 09.19.16 Time: 3:00 pm Location: Craft Room  Group Topic: Wellness  Goal Area(s) Addresses:  Patient will identify at least one item per each dimension of health. Patient will examine areas they are deficient in.  Behavioral Response: Attentive, Interactive  Intervention: 6 Dimensions of Health  Activity: Patients were given a sheet with the definitions of the 6 dimensions. Patients were given a worksheet with the 6 dimensions and were instructed to list at least one item in each dimension.   Education: LRT educated patients on ways to increase each dimension.  Education Outcome: Acknowledges education/In group clarification offered  Clinical Observations/Feedback: Patient completed activity by listing at least 2 items per category. Patient contributed to group discussion by stating what area she is giving enough attention to, and what area she is not giving enough attention to.  Leonette Monarch, LRT/CTRS 05/20/2015 4:29 PM

## 2015-05-20 NOTE — B[facility] Group Notes (Signed)
[place] Group Notes:  (Nursing/MHT/Case Management/Adjunct)  Date:  05/20/2015  Time:  10:29 PM  Type of Therapy:  Group Therapy  Participation Level:  Active  Participation Quality:  Appropriate  Affect:  Appropriate  Cognitive:  Appropriate  Insight:  Good  Engagement in Group:  Engaged  Modes of Intervention:  Discussion  Summary of Progress/Problems:  Kendra Nicholson 05/20/2015, 10:29 PM

## 2015-05-20 NOTE — Plan of Care (Signed)
Problem: Ineffective individual coping Goal: LTG: Patient will report a decrease in negative feelings Outcome: Progressing Patient states that she feels much better each day. Patient denies SI and states they are looking forward to discharge.

## 2015-05-20 NOTE — B[facility] Group Notes (Signed)
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 Patient apologized stating she has said some harsh things in the past week and hopes she did not say anything too harsh to staff and appreciates all the help she has received.   Keene Breath, MSW, LCSWA 05/20/2015, 3:01 PM

## 2015-05-21 LAB — GLUCOSE, CAPILLARY
Glucose-Capillary: 108 mg/dL — ABNORMAL HIGH (ref 65–99)
Glucose-Capillary: 124 mg/dL — ABNORMAL HIGH (ref 65–99)
Glucose-Capillary: 104 mg/dL — ABNORMAL HIGH (ref 65–99)
Glucose-Capillary: 111 mg/dL — ABNORMAL HIGH (ref 65–99)

## 2015-05-21 MED ORDER — NICOTINE 14 MG/24HR TD PT24
14.0000 mg | MEDICATED_PATCH | Freq: Every day | TRANSDERMAL | Status: DC
  Filled 2015-05-21: qty 1

## 2015-05-21 MED ORDER — LIDOCAINE 5 % EX PTCH
1.0000 | MEDICATED_PATCH | CUTANEOUS | Status: DC
  Administered 2015-05-22 – 2015-05-23 (×2): 1 via TRANSDERMAL
  Filled 2015-05-21 (×4): qty 1

## 2015-05-21 NOTE — Plan of Care (Signed)
Problem: Consults Goal: [facility] General Treatment Patient Education Outcome: Progressing Patient has been receptive to education received during the shift.   Problem: Ineffective individual coping Goal: LTG: Patient will report a decrease in negative feelings Outcome: Not Progressing Patient continues to voice negative feelings about her family.  Goal: LTG-Other (Specify)- Outcome: Progressing Patient has expressed some positive coping strategies.  Goal: STG: Pt will be able to identify effective and ineffective STG: Pt will be able to identify effective and ineffective coping patterns  Outcome: Progressing Patient able to identify effective coping strategies. Goal: STG: Patient will remain free from self harm Outcome: Progressing Patient remained free of harm on Q15 minute checks.  Goal: STG:Pt. will utilize relaxation techniques to reduce stress STG: Patient will utilize relaxation techniques to reduce stress levels  Outcome: Progressing Patient expressed that she walks to reduce stress.  Goal: STG: Patient will participate in after care plan Outcome: Progressing Patient receptive to after care services. Goal: STG-Increase in ability to manage activities of daily living Outcome: Progressing Patient able to complete ADLs independently.  Goal: STG-Other (Specify): Outcome: Progressing Patient colors and reads to cope.

## 2015-05-21 NOTE — Progress Notes (Signed)
Patient states that she feels much better each day. Patient denies SI and states they are looking forward to discharge. The patient states pain 6/10 in shoulder and neck. PRN medications given at 0507. Q 15 min checks maintained for safety. Will continue to monitor.

## 2015-05-21 NOTE — Progress Notes (Addendum)
[facility] MD Progress Note  05/21/2015 2:16 PM Kendra Nicholson  MRN:  [id number]  Subjective:  Kendra Nicholson reports further improvement. She is no longer depressed or irritable. She was very apologetic to a group for her agitated and mean behavior at the beginning of her treatment here. She tolerates medications well. She has no somatic. She requested a nicotine patch yesterday. There is excellent group participation. The patient has been very supportive of peers.  Principal Problem: PTSD (post-traumatic stress disorder) Diagnosis:   Patient Active Problem List   Diagnosis Date Noted  . PTSD (post-traumatic stress disorder) [F43.10] 05/09/2015  . Bipolar 2 disorder [F31.81] 05/09/2015  . Hypertension [I10] 05/09/2015  . Diabetes [E11.9] 05/09/2015   Total Time spent with patient: 20 minutes   Past Medical History:  Past Medical History  Diagnosis Date  . Anxiety   . Depression   . Hypertension   . Diabetes mellitus without complication    History reviewed. No pertinent past surgical history. Family History: History reviewed. No pertinent family history. Social History:  History  Alcohol Use No     History  Drug Use Not on file    Social History   Social History  . Marital Status: Divorced    Spouse Name: N/A  . Number of Children: N/A  . Years of Education: N/A   Social History Main Topics  . Smoking status: Never Smoker   . Smokeless tobacco: None  . Alcohol Use: No  . Drug Use: None  . Sexual Activity: Not Asked   Other Topics Concern  . None   Social History Narrative   Additional History:    Sleep: Good  Appetite:  Good   Assessment:   Musculoskeletal: Strength & Muscle Tone: within normal limits Gait & Station: normal Patient leans: N/A   Psychiatric Specialty Exam: Physical Exam  Nursing note and vitals reviewed.   Review of Systems  Musculoskeletal: Positive for myalgias.  All other systems reviewed and are negative.   Blood pressure  125/79, pulse 80, temperature 97.5 F (36.4 C), temperature source Oral, resp. rate 20, height 5\' 6"  (1.676 m), weight 78.472 kg (173 lb), SpO2 99 %.Body mass index is 27.94 kg/(m^2).  General Appearance: Casual  Eye Contact::  Good  Speech:  Clear and Coherent  Volume:  Normal  Mood:  Euthymic  Affect:  Appropriate  Thought Process:  Goal Directed and Linear  Orientation:  Full (Time, Place, and Person)  Thought Content:  Delusions and Paranoid Ideation  Suicidal Thoughts:  No  Homicidal Thoughts:  No  Memory:  Immediate;   Fair Recent;   Fair Remote;   Fair  Judgement:  Fair  Insight:  Fair  Psychomotor Activity:  Normal  Concentration:  Fair  Recall:  [employer] of Knowledge:Fair  Language: Fair  Akathisia:  No  Handed:  Right  AIMS (if indicated):     Assets:  Communication Skills Desire for Improvement Financial Resources/Insurance Social Support  ADL's:  Intact  Cognition: WNL  Sleep:  Number of Hours: 6.25     Current Medications: Current Facility-Administered Medications  Medication Dose Route Frequency Provider Last Rate Last Dose  . acetaminophen (TYLENOL) tablet 650 mg  650 mg Oral Q6H PRN Gonzella Lex, MD   650 mg at 05/21/15 0507  . alum & mag hydroxide-simeth (MAALOX/MYLANTA) 200-200-20 MG/5ML suspension 30 mL  30 mL Oral Q4H PRN Gonzella Lex, MD      . amLODipine (NORVASC) tablet 10 mg  10  mg Oral Daily Gonzella Lex, MD   10 mg at 05/21/15 0910  . busPIRone (BUSPAR) tablet 15 mg  15 mg Oral BID Gonzella Lex, MD   15 mg at 05/21/15 0910  . carbidopa-levodopa (SINEMET IR) 25-100 MG per tablet immediate release 1 tablet  1 tablet Oral TID Gonzella Lex, MD   1 tablet at 05/21/15 0909  . citalopram (CELEXA) tablet 40 mg  40 mg Oral Daily Gonzella Lex, MD   40 mg at 05/21/15 0909  . clotrimazole (LOTRIMIN) 1 % cream   Topical BID Gonzella Lex, MD   1 application at 36/64/40 0911  . estradiol (ESTRACE) tablet 1 mg  1 mg Oral Daily Gonzella Lex, MD    1 mg at 05/21/15 0909  . fluticasone (FLONASE) 50 MCG/ACT nasal spray 2 spray  2 spray Each Nare Daily Marjie Skiff, MD   2 spray at 05/21/15 0910  . glipiZIDE (GLUCOTROL XL) 24 hr tablet 5 mg  5 mg Oral Q breakfast Gonzella Lex, MD   5 mg at 05/21/15 0831  . magnesium hydroxide (MILK OF MAGNESIA) suspension 30 mL  30 mL Oral Daily PRN Gonzella Lex, MD      . metFORMIN (GLUCOPHAGE) tablet 1,000 mg  1,000 mg Oral BID WC Gonzella Lex, MD   1,000 mg at 05/21/15 0831  . metoprolol succinate (TOPROL-XL) 24 hr tablet 50 mg  50 mg Oral Daily Gonzella Lex, MD   50 mg at 05/21/15 0910  . pravastatin (PRAVACHOL) tablet 20 mg  20 mg Oral q1800 Gonzella Lex, MD   20 mg at 05/20/15 1843  . QUEtiapine (SEROQUEL) tablet 150 mg  150 mg Oral QHS Clovis Fredrickson, MD   150 mg at 05/20/15 2130  . tiZANidine (ZANAFLEX) tablet 2 mg  2 mg Oral Q6H PRN Gonzella Lex, MD   2 mg at 05/21/15 0508    Lab Results:  Results for orders placed or performed during the hospital encounter of 05/09/15 (from the past 48 hour(s))  Glucose, capillary     Status: Abnormal   Collection Time: 05/20/15  6:37 AM  Result Value Ref Range   Glucose-Capillary 104 (H) 65 - 99 mg/dL  Glucose, capillary     Status: Abnormal   Collection Time: 05/20/15 12:29 PM  Result Value Ref Range   Glucose-Capillary 108 (H) 65 - 99 mg/dL   Comment 1 Notify RN   Glucose, capillary     Status: Abnormal   Collection Time: 05/20/15  4:39 PM  Result Value Ref Range   Glucose-Capillary 129 (H) 65 - 99 mg/dL   Comment 1 Notify RN   Glucose, capillary     Status: Abnormal   Collection Time: 05/21/15  6:46 AM  Result Value Ref Range   Glucose-Capillary 124 (H) 65 - 99 mg/dL   Comment 1 Notify RN    Comment 2 Document in Chart   Glucose, capillary     Status: Abnormal   Collection Time: 05/21/15 12:14 PM  Result Value Ref Range   Glucose-Capillary 104 (H) 65 - 99 mg/dL   Comment 1 Notify RN     Physical Findings: AIMS: Facial  and Oral Movements Muscles of Facial Expression: None, normal Lips and Perioral Area: None, normal Jaw: None, normal Tongue: None, normal,Extremity Movements Upper (arms, wrists, hands, fingers): None, normal Lower (legs, knees, ankles, toes): None, normal, Trunk Movements Neck, shoulders, hips: None, normal, Overall Severity Severity of  abnormal movements (highest score from questions above): None, normal Incapacitation due to abnormal movements: None, normal Patient's awareness of abnormal movements (rate only patient's report): No Awareness, Dental Status Current problems with teeth and/or dentures?: No Does patient usually wear dentures?: No  CIWA:  CIWA-Ar Total: 4 COWS:     Treatment Plan Summary: Daily contact with patient to assess and evaluate symptoms and progress in treatment and Medication management   Medical Decision Making:  Established Problem, Stable/Improving (1), Review of Psycho-Social Stressors (1), Review or order clinical lab tests (1), Review of Medication Regimen & Side Effects (2) and Review of New Medication or Change in Dosage (2)   Kendra Nicholson is a 64 year old female with a history of PTSD admitted for agitated behavior at the assisted living facility.   1. Agitation. Has pressured speech  2. PTSD. The patient has been maintained on the combination of BuSpar, Celexa and doxepin.   3. Mood. Continue on Seroquel  3. Diabetes. We'll continue Glucotrol and metformin with ADA diet and lack glucose monitoring.   4. Hypertension. We continue Norvasc and metoprolol.  5. Dyslipidemia. We'll continue Pravachol.  6. Parkinson disease. We'll continue Sinemet.  7. Chronic pain. We'll continue Zanaflex.  8. Smoking. We will offer nicotine patch.   9. Disposition. The patient is not allowed to return to assisted living facility. She needs placement. PPD ordered. She has PASSR already.  I certify that the services received since the previous  certification/recertification were and continue to be medically necessary as the treatment provided can be reasonably expected to improve the patient's condition; the medical record documents that the services furnished were intensive treatment services or their equivalent services, and this patient continues to need, on a daily basis, active treatment furnished directly by or requiring the supervision of inpatient psychiatric personnel.   Holman Bonsignore 05/21/2015, 2:16 PM

## 2015-05-21 NOTE — BHH Group Notes (Signed)
[facility] Group Notes:  (Nursing/MHT/Case Management/Adjunct)  Date:  05/21/2015  Time:  1:50 PM  Type of Therapy:  Psychoeducational Skills  Participation Level:  Did Not Attend  P  [name] 05/21/2015, 1:50 PM

## 2015-05-21 NOTE — Progress Notes (Signed)
Recreation Therapy Notes  Date: 09.20.16 Time: 3:00 pm Location: Craft Room  Group Topic: Self-expression  Goal Area(s) Addresses:  Patient will be able to identify a color that represents each emotion. Patient will verbalize benefit of using art as a means of self-expression.  Behavioral Response: Did not attend  Intervention: The Colors Within Me  Activity: Patients were given a blank face worksheet and instructed to pick a color for each emotion they were experiencing and show how much of that color they were feeling on the face.  Education: LRT educated patients on different forms of self-expression   Education Outcome: Patient did not attend group.   Clinical Observations/Feedback: Patient did not attend group.  Leonette Monarch, LRT/CTRS 05/21/2015 4:32 PM

## 2015-05-21 NOTE — Progress Notes (Signed)
Patient alert, oriented and was medication compliant during the shift. Patient endorses SI at this time but agreed to verbal contract for safety.  Patient reported being upset at previous placement because she feels that they accused her of hurting someone at the facility. Patient also reported that her family thinks she is horder but patient denies being a horder. Patient attended groups during the day. RN educated patient on coping strategies for anger and stress relievers. Patient was receptive to education provided during the shift. Patient met with a group home for possible placement and expressed excitement at going to the group home.  

## 2015-05-22 LAB — GLUCOSE, CAPILLARY
Glucose-Capillary: 138 mg/dL — ABNORMAL HIGH (ref 65–99)
Glucose-Capillary: 132 mg/dL — ABNORMAL HIGH (ref 65–99)
Glucose-Capillary: 68 mg/dL (ref 65–99)
Glucose-Capillary: 139 mg/dL — ABNORMAL HIGH (ref 65–99)

## 2015-05-22 MED ORDER — CARBIDOPA-LEVODOPA 25-100 MG PO TABS: 1.0000 | ORAL_TABLET | Freq: Three times a day (TID) | ORAL | Status: DC

## 2015-05-22 MED ORDER — ACETAMINOPHEN 325 MG PO TABS: 650.0000 mg | ORAL_TABLET | Freq: Four times a day (QID) | ORAL | Status: AC | PRN

## 2015-05-22 MED ORDER — AMLODIPINE BESYLATE 10 MG PO TABS: 10.0000 mg | ORAL_TABLET | Freq: Every day | ORAL | Status: DC

## 2015-05-22 MED ORDER — METFORMIN HCL 1000 MG PO TABS: 1000.0000 mg | ORAL_TABLET | Freq: Two times a day (BID) | ORAL | Status: DC

## 2015-05-22 MED ORDER — RIZATRIPTAN BENZOATE 5 MG PO TABS: 5.0000 mg | ORAL_TABLET | ORAL | Status: DC | PRN

## 2015-05-22 MED ORDER — QUETIAPINE FUMARATE 50 MG PO TABS: 150.0000 mg | ORAL_TABLET | Freq: Every day | ORAL | Status: DC

## 2015-05-22 MED ORDER — TIZANIDINE HCL 2 MG PO TABS: 2.0000 mg | ORAL_TABLET | Freq: Two times a day (BID) | ORAL | Status: DC

## 2015-05-22 MED ORDER — LIDOCAINE 5 % EX PTCH: 1.0000 | MEDICATED_PATCH | CUTANEOUS | Status: DC

## 2015-05-22 MED ORDER — PRAVASTATIN SODIUM 20 MG PO TABS: 20.0000 mg | ORAL_TABLET | Freq: Every day | ORAL | Status: DC

## 2015-05-22 MED ORDER — BUSPIRONE HCL 15 MG PO TABS: 15.0000 mg | ORAL_TABLET | Freq: Two times a day (BID) | ORAL | Status: DC

## 2015-05-22 MED ORDER — THEREMS PO TABS: 1.0000 | ORAL_TABLET | Freq: Every day | ORAL | Status: DC

## 2015-05-22 MED ORDER — CITALOPRAM HYDROBROMIDE 40 MG PO TABS: 40.0000 mg | ORAL_TABLET | Freq: Every day | ORAL | Status: DC

## 2015-05-22 MED ORDER — GLIPIZIDE 5 MG PO TABS: 5.0000 mg | ORAL_TABLET | Freq: Every day | ORAL | Status: DC

## 2015-05-22 MED ORDER — CLOTRIMAZOLE 1 % EX CREA: 1.0000 "application " | TOPICAL_CREAM | Freq: Two times a day (BID) | CUTANEOUS | Status: DC | PRN

## 2015-05-22 MED ORDER — EPINEPHRINE 0.3 MG/0.3ML IJ SOAJ: 0.3000 mg | Freq: Once | INTRAMUSCULAR | Status: AC | PRN

## 2015-05-22 MED ORDER — METOPROLOL SUCCINATE ER 50 MG PO TB24: 50.0000 mg | ORAL_TABLET | Freq: Every day | ORAL | Status: DC

## 2015-05-22 MED ORDER — ESTRADIOL 1 MG PO TABS: 1.0000 mg | ORAL_TABLET | Freq: Every day | ORAL | Status: DC

## 2015-05-22 MED ORDER — FLUTICASONE PROPIONATE 50 MCG/ACT NA SUSP: 2.0000 | Freq: Every day | NASAL | Status: DC

## 2015-05-22 NOTE — Tx Team (Addendum)
Interdisciplinary Treatment Plan Update (Adult)  Date:  05/22/2015 Time Reviewed:  9:07 AM  Progress in Treatment: Attending groups: Yes. Participating in groups:  Yes.  Taking medication as prescribed:  Yes.  Tolerating medication:  Yes. Family/Significant othe contact made:  Yes, individual(s) contacted:  son in law Sherren Mocha [phone] Patient understands diagnosis:  Yes. Discussing patient identified problems/goals with staff:  Yes. Medical problems stabilized or resolved:  Yes. Denies suicidal/homicidal ideation: Yes. Issues/concerns per patient self-inventory:  No. Other:  New problem(s) identified: No, Describe:  none reported  Discharge Plan or Barriers: Patient is not able to return to [facility] and will need new placement but has PASRR and needs placed. Patient is followed by [facility].   Reason for Continuation of Hospitalization: Mania  Comments:  Estimated length of stay: 2-3 days with expected discharge Friday 05/24/15  New goal(s):  Review of initial/current patient goals per problem list:   See Care Plan  Attendees: Physician:  Orson Slick, MD 9/21/20169:07 AM  Nursing:   Elige Radon, RN 9/21/20169:07 AM  Other:  Carmell Austria, [place] 9/21/20169:07 AM   Scribe for Treatment Team:   Keene Breath, MSW, LCSWA  05/22/2015, 9:07 AM

## 2015-05-22 NOTE — Plan of Care (Signed)
Problem: Alteration in mood; excessive anxiety as evidenced by: Goal: LTG-Patient's behavior demonstrates decreased anxiety (Patient's behavior demonstrates anxiety and he/she is utilizing learned coping skills to deal with anxiety-producing situations)  Outcome: Not Progressing Patient states that she is having anxiety today as a result of stress. Patient is concerned about placement upon D/C.

## 2015-05-22 NOTE — Plan of Care (Signed)
Problem: Ineffective individual coping Goal: LTG: Patient will report a decrease in negative feelings Outcome: Progressing Patient did not display any negative feelings.

## 2015-05-22 NOTE — BHH Group Notes (Signed)
[facility] Group Notes:  (Nursing/MHT/Case Management/Adjunct)  Date:  05/22/2015  Time:  5:06 PM  Type of Therapy:  Psychoeducational Skills  Participation Level:  Active  Participation Quality:  Appropriate, Attentive and Sharing  Affect:  Appropriate  Cognitive:  Appropriate  Insight:  Appropriate  Engagement in Group:  Engaged  Modes of Intervention:  Discussion, Education and Support  Summary of Progress/Problems:  Kendra Nicholson [name] 05/22/2015, 5:06 PM

## 2015-05-22 NOTE — BHH Group Notes (Signed)
[facility] LCSW Group Therapy  05/22/2015 4:04 PM  Type of Therapy:  Group Therapy  Participation Level:  Minimal  Participation Quality:  Attentive  Affect:  Appropriate  Cognitive:  Alert  Insight:  Improving  Engagement in Therapy:  Improving  Modes of Intervention:  Discussion, Education, Socialization and Support  Summary of Progress/Problems: Pt will identify unhealthy thoughts and how they impact their emotions and behavior. Pt will be encouraged to discuss these thoughts, emotions and behaviors with the group. Kendra Nicholson was pleasant in group today. She discussed feeling guilty and unappreciated. She was supportive of other group members.   [place] MSW, [place]  05/22/2015, 4:04 PM

## 2015-05-22 NOTE — BHH Group Notes (Signed)
[facility] LCSW Aftercare Discharge Planning Group Note   05/22/2015 12:09 PM  Participation Quality:  Patient attended group and participated appropriately sharing her SMART goal is to find a facility as she cannot return to her previous assisted living.   Mood/Affect:  Appropriate  Thoughts of Suicide:  No Will you contract for safety?   NA  Current AVH:  No  Plan for Discharge/Comments: Patient will be placed in a new [facility] or ALF and will followup with [facility]  Transportation Means: ALF and son-in-law Sherren Mocha  Supports: Patient has a supportive DTR and son-in-law and mental health follow up.  Keene Breath, MSW, LCSWA

## 2015-05-22 NOTE — Progress Notes (Signed)
Patient states that she is having anxiety today as a result of stress. Patient is concerned about placement upon D/C. Patient states that the meeting she had with a potential group home went well. The patient states that she has had some thoughts of hurting herself today. When asked of a plan the patient states that she thinks of sticking a screwdriver in and electrical outlet. The patient c/o of shoulder, neck, and back pain during shift and PRN medications given with good result, Tylenol and Zanaflex. Patient currently resting in her room with no further complaints. Will continue to monitor. Q 15 min checks maintained for safety.

## 2015-05-22 NOTE — Progress Notes (Signed)
Recreation Therapy Notes  Date: 09.21.16 Time: 3:00 pm Location: Craft Room  Group Topic: Self-esteem  Goal Area(s) Addresses:  Patient will write at least one positive trait. Patient will verbalize benefit of having a good self-esteem.  Behavioral Response: Attentive, Interactive  Intervention: I Am  Activity: Patients were given a worksheet with the letter I on it and instructed to write as many positive traits about themselves inside the I.  Education: LRT educated patients on ways they can increase their self-esteem.   Education Outcome: In group clarification offered  Clinical Observations/Feedback: Patient completed activity by filling whole worksheet with positive traits. Patient contributed to group discussion.  Leonette Monarch, LRT/CTRS 05/22/2015 4:42 PM

## 2015-05-22 NOTE — Progress Notes (Signed)
[facility] MD Progress Note  05/22/2015 11:41 AM JARETSSI KRAKER  MRN:  [id number]  Subjective:  Kendra Nicholson met with prospective group home owner yesterday. The meeting reportedly went well. The patient this morning expressed some suicidal thoughts to her nurse. This is unusual as she has not been suicidal in the hospital or prior to admission. She has a plan to put a screwdriver in AN outlet. She explains that she is rather anxious about placement. She has been in the hospital for 13 days and in spite of initial difficulties she likes this place. She understands that she needs to move on. She complains of soreness in the lower back for which she was given Lidoderm patches. She takes medications as prescribed. She eagerly participates in groups. She has appropriate interaction with peers and staff.   Principal Problem: PTSD (post-traumatic stress disorder) Diagnosis:   Patient Active Problem List   Diagnosis Date Noted  . PTSD (post-traumatic stress disorder) [F43.10] 05/09/2015  . Bipolar 2 disorder [F31.81] 05/09/2015  . Hypertension [I10] 05/09/2015  . Diabetes [E11.9] 05/09/2015   Total Time spent with patient: 20 minutes   Past Medical History:  Past Medical History  Diagnosis Date  . Anxiety   . Depression   . Hypertension   . Diabetes mellitus without complication    History reviewed. No pertinent past surgical history. Family History: History reviewed. No pertinent family history. Social History:  History  Alcohol Use No     History  Drug Use Not on file    Social History   Social History  . Marital Status: Divorced    Spouse Name: N/A  . Number of Children: N/A  . Years of Education: N/A   Social History Main Topics  . Smoking status: Never Smoker   . Smokeless tobacco: None  . Alcohol Use: No  . Drug Use: None  . Sexual Activity: Not Asked   Other Topics Concern  . None   Social History Narrative   Additional History:    Sleep: Fair  Appetite:   Fair   Assessment:   Musculoskeletal: Strength & Muscle Tone: within normal limits Gait & Station: normal Patient leans: N/A   Psychiatric Specialty Exam: Physical Exam  Nursing note and vitals reviewed.   Review of Systems  Musculoskeletal: Positive for myalgias and back pain.  All other systems reviewed and are negative.   Blood pressure 135/80, pulse 84, temperature 97.9 F (36.6 C), temperature source Oral, resp. rate 20, height 5' 6"  (1.676 m), weight 78.472 kg (173 lb), SpO2 99 %.Body mass index is 27.94 kg/(m^2).  General Appearance: Casual  Eye Contact::  Good  Speech:  Clear and Coherent  Volume:  Normal  Mood:  Depressed  Affect:  Appropriate  Thought Process:  Goal Directed  Orientation:  Full (Time, Place, and Person)  Thought Content:  Delusions and Paranoid Ideation  Suicidal Thoughts:  Yes.  with intent/plan  Homicidal Thoughts:  No  Memory:  Immediate;   Fair Recent;   Fair Remote;   Fair  Judgement:  Impaired  Insight:  Shallow  Psychomotor Activity:  Normal  Concentration:  Fair  Recall:  [place]  Language: Fair  Akathisia:  No  Handed:  Right  AIMS (if indicated):     Assets:  Communication Skills Desire for Improvement Financial Resources/Insurance Resilience Social Support  ADL's:  Intact  Cognition: WNL  Sleep:  Number of Hours: 5.25     Current Medications: Current Facility-Administered Medications  Medication Dose Route Frequency Provider Last Rate Last Dose  . acetaminophen (TYLENOL) tablet 650 mg  650 mg Oral Q6H PRN Gonzella Lex, MD   650 mg at 05/22/15 1058  . alum & mag hydroxide-simeth (MAALOX/MYLANTA) 200-200-20 MG/5ML suspension 30 mL  30 mL Oral Q4H PRN Gonzella Lex, MD      . amLODipine (NORVASC) tablet 10 mg  10 mg Oral Daily Gonzella Lex, MD   10 mg at 05/22/15 1055  . busPIRone (BUSPAR) tablet 15 mg  15 mg Oral BID Gonzella Lex, MD   15 mg at 05/22/15 0934  . carbidopa-levodopa (SINEMET  IR) 25-100 MG per tablet immediate release 1 tablet  1 tablet Oral TID Gonzella Lex, MD   1 tablet at 05/22/15 0934  . citalopram (CELEXA) tablet 40 mg  40 mg Oral Daily Gonzella Lex, MD   40 mg at 05/22/15 0932  . clotrimazole (LOTRIMIN) 1 % cream   Topical BID Gonzella Lex, MD      . estradiol (ESTRACE) tablet 1 mg  1 mg Oral Daily Gonzella Lex, MD   1 mg at 05/22/15 2542  . fluticasone (FLONASE) 50 MCG/ACT nasal spray 2 spray  2 spray Each Nare Daily Marjie Skiff, MD   2 spray at 05/22/15 [phone]  . glipiZIDE (GLUCOTROL XL) 24 hr tablet 5 mg  5 mg Oral Q breakfast Gonzella Lex, MD   5 mg at 05/22/15 0933  . lidocaine (LIDODERM) 5 % 1 patch  1 patch Transdermal Q24H Clovis Fredrickson, MD   1 patch at 05/22/15 0620  . magnesium hydroxide (MILK OF MAGNESIA) suspension 30 mL  30 mL Oral Daily PRN Gonzella Lex, MD      . metFORMIN (GLUCOPHAGE) tablet 1,000 mg  1,000 mg Oral BID WC Gonzella Lex, MD   1,000 mg at 05/22/15 0932  . metoprolol succinate (TOPROL-XL) 24 hr tablet 50 mg  50 mg Oral Daily Gonzella Lex, MD   50 mg at 05/22/15 1055  . pravastatin (PRAVACHOL) tablet 20 mg  20 mg Oral q1800 Gonzella Lex, MD   20 mg at 05/21/15 1708  . QUEtiapine (SEROQUEL) tablet 150 mg  150 mg Oral QHS Clovis Fredrickson, MD   150 mg at 05/21/15 2131  . tiZANidine (ZANAFLEX) tablet 2 mg  2 mg Oral Q6H PRN Gonzella Lex, MD   2 mg at 05/22/15 3762    Lab Results:  Results for orders placed or performed during the hospital encounter of 05/09/15 (from the past 48 hour(s))  Glucose, capillary     Status: Abnormal   Collection Time: 05/20/15 12:29 PM  Result Value Ref Range   Glucose-Capillary 108 (H) 65 - 99 mg/dL   Comment 1 Notify RN   Glucose, capillary     Status: Abnormal   Collection Time: 05/20/15  4:39 PM  Result Value Ref Range   Glucose-Capillary 129 (H) 65 - 99 mg/dL   Comment 1 Notify RN   Glucose, capillary     Status: Abnormal   Collection Time: 05/21/15  6:46 AM   Result Value Ref Range   Glucose-Capillary 124 (H) 65 - 99 mg/dL   Comment 1 Notify RN    Comment 2 Document in Chart   Glucose, capillary     Status: Abnormal   Collection Time: 05/21/15 12:14 PM  Result Value Ref Range   Glucose-Capillary 104 (H) 65 - 99 mg/dL   Comment  1 Notify RN   Glucose, capillary     Status: Abnormal   Collection Time: 05/21/15  4:46 PM  Result Value Ref Range   Glucose-Capillary 111 (H) 65 - 99 mg/dL  Glucose, capillary     Status: Abnormal   Collection Time: 05/22/15  7:16 AM  Result Value Ref Range   Glucose-Capillary 138 (H) 65 - 99 mg/dL    Physical Findings: AIMS: Facial and Oral Movements Muscles of Facial Expression: None, normal Lips and Perioral Area: None, normal Jaw: None, normal Tongue: None, normal,Extremity Movements Upper (arms, wrists, hands, fingers): None, normal Lower (legs, knees, ankles, toes): None, normal, Trunk Movements Neck, shoulders, hips: None, normal, Overall Severity Severity of abnormal movements (highest score from questions above): None, normal Incapacitation due to abnormal movements: None, normal Patient's awareness of abnormal movements (rate only patient's report): No Awareness, Dental Status Current problems with teeth and/or dentures?: No Does patient usually wear dentures?: No  CIWA:  CIWA-Ar Total: 4 COWS:     Treatment Plan Summary: Daily contact with patient to assess and evaluate symptoms and progress in treatment and Medication management   Medical Decision Making:  Established Problem, Stable/Improving (1), Review of Psycho-Social Stressors (1), Review or order clinical lab tests (1), Review of Medication Regimen & Side Effects (2) and Review of New Medication or Change in Dosage (2)   Ms. Thayer is a 64 year old female with a history of PTSD admitted for agitated behavior at the assisted living facility.   1. Agitation. Has pressured speech  2. PTSD. The patient has been maintained on the  combination of BuSpar, Celexa and doxepin.   3. Mood. Continue on Seroquel  3. Diabetes. We'll continue Glucotrol and metformin with ADA diet and lack glucose monitoring.   4. Hypertension. We continue Norvasc and metoprolol.  5. Dyslipidemia. We'll continue Pravachol.  6. Parkinson disease. We'll continue Sinemet.  7. Chronic pain. We'll continue Zanaflex.  8. Smoking. We will offer nicotine patch.   9. Disposition. The patient is not allowed to return to assisted living facility. She needs placement. PPD ordered. She has PASSR already.      Anthony Tamburo 05/22/2015, 11:41 AM

## 2015-05-22 NOTE — Progress Notes (Signed)
Patient alert oriented x4, calm and cooperative during the shift. Patient was medication compliant during the shift. Patient stated that she would be going to a group home in [facility] and that she is leaving tomorrow. Patient blood glucose was 68, patient did not eat her evening snack. Patient was given OJ and she ate her dinner and blood glucose rechecked and it was 139. Patient educated to eat snacks in between meals to prevent low blood glucose levels. Patient receptive to education provided during the shift.

## 2015-05-22 NOTE — [facility] Group Notes (Signed)
[place] LCSW Group Therapy  05/22/2015 12:08 PM  Type of Therapy:  Group Therapy  Participation Level:  Active  Participation Quality:  Appropriate and Attentive  Affect:  Appropriate  Cognitive:  Alert, Appropriate and Oriented  Insight:  Improving  Engagement in Therapy:  Improving  Modes of Intervention:  Socialization and Support  Summary of Progress/Problems: Patient attended and particiapted in group appropriately. Patient shared that she was looking for a place to stay as she cannot go back to her previous situation. Patient shared her success in writing poetry and received positive feedback from other group members.   Keene Breath, MSW, LCSWA 05/22/2015, 12:08 PM

## 2015-05-23 LAB — GLUCOSE, CAPILLARY
Glucose-Capillary: 130 mg/dL — ABNORMAL HIGH (ref 65–99)
Glucose-Capillary: 110 mg/dL — ABNORMAL HIGH (ref 65–99)
Glucose-Capillary: 122 mg/dL — ABNORMAL HIGH (ref 65–99)
Glucose-Capillary: 132 mg/dL — ABNORMAL HIGH (ref 65–99)

## 2015-05-23 NOTE — Plan of Care (Signed)
Problem: [facility] Participation in Recreation Therapeutic Interventions Goal: STG-Patient will demonstrate improved self esteem by identif STG: Self-Esteem - Within 3 treatment sessions, patient will verbalize at least 5 positive affirmation statements in one treatment session to increase self-esteem skills post d/c.  Outcome: Completed/Met Date Met:  05/23/15 Treatment Session 1; Completed 1 out of 1: At approximately 1:00 pm, LRT met with patient in patient room. Patient verbalized 5 positive affirmation statements. Patient reported it felt "different". LRT encouraged patient to continue saying positive affirmation statements.  Leonette Monarch, LRT/CTRS 09.22.16 5:13 pm Goal: STG-Other Recreation Therapy Goal (Specify) STG: Stress Management - Within 3 treatment sessions, patient will verbalize understanding of the stress management techniques in one treatment session to increase stress management skills post d/c.  Outcome: Completed/Met Date Met:  05/23/15 Treatment Session 1; Completed 1 out of 1: At approximately 1:00 pm, LRT met with patient in patient room. LRT educated and provided patient with handouts on the stress management techniques. Patient verbalized understanding. LRT encouraged patient to read over and practice the stress management techniques.  Leonette Monarch, LRT/CTRS 09.22.16 5:15 pm

## 2015-05-23 NOTE — Progress Notes (Signed)
Recreation Therapy Notes  Date: 09.22.16 Time: 3:00 pm Location: Craft Room  Group Topic: Leisure Education  Goal Area(s) Addresses:  Patient will identify activities for each letter of the alphabet. Patient will verbalize ability to integrate positive leisure into life post d/c. Patient will verbalize ability to use leisure as a Technical sales engineer.  Behavioral Response: Attentive, Interactive  Intervention: Leisure Alphabet  Activity: Patients were given a leisure Air traffic controller and instructed to list healthy leisure activities for each letter of the alphabet.  Education: LRT educated patients on what they needed to participate in leisure activities.   Education Outcome: In group clarification offered  Clinical Observations/Feedback: Patient wrote something down for each letter of the alphabet. Patient wrote things like "indigo" and "x-ray imagination". Patient stated what she wrote for certain letters in group discussion.  Leonette Monarch, LRT/CTRS 05/23/2015 4:46 PM

## 2015-05-23 NOTE — Progress Notes (Signed)
Pt given belongings and discharged to group home staff. Went over paperwork with group home and Patient, when patient heard that she was getting prescriptions instead of physical medicine to go with her, she turned to the group home staff and stated, "I will sue you if you don't get that for me and have it right away." Pt had been pleasant all day until this moment. Otherwise discharge went without incident and patient went with group home staff without any other comment.
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
## 2015-05-23 NOTE — Progress Notes (Signed)
D: Patient denies SI/HI/AVH.   Patient affect and mood is anxious. Patient did attend evening group. Patient interaction with staff and other patients is appropriate.  Patient visible on the milieu. No distress noted. A: Support and encouragement offered. Scheduled medications given to pt. Q 15 min checks continued for patient safety. R: Patient receptive. Patient remains safe on the unit.

## 2015-05-23 NOTE — Progress Notes (Signed)
Pt in good spirits, very friendly and upbeat this morning, good positive attitude and eager to discharge today. Med compliant, cooperative, but does c/o generalized pain. RN gave Tylenol and pt will be re-assessed for response.
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
## 2015-05-23 NOTE — [facility] Suicide Risk Assessment (Signed)
[facility] Discharge Suicide Risk Assessment   Demographic Factors:  Caucasian  Total Time spent with patient: 30 minutes  Musculoskeletal: Strength & Muscle Tone: within normal limits Gait & Station: normal Patient leans: N/A  Psychiatric Specialty Exam: Physical Exam  Nursing note and vitals reviewed.   Review of Systems  Musculoskeletal: Positive for myalgias and back pain.  All other systems reviewed and are negative.   Blood pressure 142/84, pulse 82, temperature 98.2 F (36.8 C), temperature source Oral, resp. rate 20, height 5\' 6"  (1.676 m), weight 78.472 kg (173 lb), SpO2 99 %.Body mass index is 27.94 kg/(m^2).  General Appearance: Casual  Eye Contact::  Good  Speech:  Clear and [id number]  Volume:  Normal  Mood:  Euthymic  Affect:  Appropriate  Thought Process:  Goal Directed  Orientation:  Full (Time, Place, and Person)  Thought Content:  WDL  Suicidal Thoughts:  No  Homicidal Thoughts:  No  Memory:  Immediate;   Fair Recent;   Fair Remote;   Fair  Judgement:  Fair  Insight:  Fair  Psychomotor Activity:  Normal  Concentration:  Fair  Recall:  [employer] [place]  Language: Fair  Akathisia:  No  Handed:  Right  AIMS (if indicated):     Assets:  Communication Skills Desire for Improvement Financial Resources/Insurance Resilience Social Support  Sleep:  Number of Hours: 5.25  Cognition: WNL  ADL's:  Intact   Have you used any form of tobacco in the last 30 days? (Cigarettes, Smokeless Tobacco, Cigars, and/or Pipes): No  Has this patient used any form of tobacco in the last 30 days? (Cigarettes, Smokeless Tobacco, Cigars, and/or Pipes) No  Mental Status Per Nursing Assessment::   On Admission:  NA  Current Mental Status by Physician: NA  Loss Factors: NA  Historical Factors: Impulsivity  Risk Reduction Factors:   Sense of responsibility to family, Religious beliefs about death and Positive social support  Continued Clinical Symptoms:   Bipolar Disorder:   Mixed State  Cognitive Features That Contribute To Risk:  None    Suicide Risk:  Minimal: No identifiable suicidal ideation.  Patients presenting with no risk factors but with morbid ruminations; may be classified as minimal risk based on the severity of the depressive symptoms  Principal Problem: PTSD (post-traumatic stress disorder) Discharge Diagnoses:  Patient Active Problem List   Diagnosis Date Noted  . PTSD (post-traumatic stress disorder) [F43.10] 05/09/2015  . Bipolar 2 disorder [F31.81] 05/09/2015  . Hypertension [I10] 05/09/2015  . Diabetes [E11.9] 05/09/2015    Follow-up Information    Follow up with [facility].   Contact information:   [place], [place] Ph [phone] Fax [phone]      Plan Of Care/Follow-up recommendations:  Activity:  As tolerated. Diet:  Low sodium heart healthy. Other:  Keep follow-up appointments.  Is patient on multiple antipsychotic therapies at discharge:  No   Has Patient had three or more failed trials of antipsychotic monotherapy by history:  No  Recommended Plan for Multiple Antipsychotic Therapies: NA    Jolanta Pucilowska 05/23/2015, 9:22 AM

## 2015-05-24 ENCOUNTER — Emergency Department
Admission: EM | Admit: 2015-05-24 | Discharge: 2015-05-29 | Disposition: A | Discharge status: Other Acute Inpatient Hospital | Payer: Medicare Other | Attending: Emergency Medicine | Admitting: Emergency Medicine

## 2015-05-24 ENCOUNTER — Encounter: Payer: Self-pay | Admitting: Emergency Medicine

## 2015-05-24 DIAGNOSIS — F3130 Bipolar disorder, current episode depressed, mild or moderate severity, unspecified: Secondary | ICD-10-CM | POA: Diagnosis not present

## 2015-05-24 DIAGNOSIS — R45851 Suicidal ideations: Secondary | ICD-10-CM | POA: Diagnosis present

## 2015-05-24 DIAGNOSIS — E119 Type 2 diabetes mellitus without complications: Secondary | ICD-10-CM

## 2015-05-24 DIAGNOSIS — I10 Essential (primary) hypertension: Secondary | ICD-10-CM | POA: Diagnosis present

## 2015-05-24 DIAGNOSIS — F312 Bipolar disorder, current episode manic severe with psychotic features: Secondary | ICD-10-CM | POA: Diagnosis present

## 2015-05-24 DIAGNOSIS — F4310 Post-traumatic stress disorder, unspecified: Secondary | ICD-10-CM

## 2015-05-24 DIAGNOSIS — F319 Bipolar disorder, unspecified: Secondary | ICD-10-CM

## 2015-05-24 DIAGNOSIS — Z79899 Other long term (current) drug therapy: Secondary | ICD-10-CM | POA: Insufficient documentation

## 2015-05-24 LAB — CBC
HCT: 39.1 % (ref 35.0–47.0)
Hemoglobin: 13.6 g/dL (ref 12.0–16.0)
MCH: 32.3 pg (ref 26.0–34.0)
MCHC: 34.9 g/dL (ref 32.0–36.0)
MCV: 92.6 fL (ref 80.0–100.0)
Platelets: 293 10*3/uL (ref 150–440)
RBC: 4.23 MIL/uL (ref 3.80–5.20)
RDW: 12.1 % (ref 11.5–14.5)
WBC: 8.6 10*3/uL (ref 3.6–11.0)

## 2015-05-24 LAB — URINE DRUG SCREEN, QUALITATIVE (ARMC ONLY)
Amphetamines, Ur Screen: NOT DETECTED
Barbiturates, Ur Screen: NOT DETECTED
Benzodiazepine, Ur Scrn: NOT DETECTED
Cannabinoid 50 Ng, Ur ~~LOC~~: NOT DETECTED
Cocaine Metabolite,Ur ~~LOC~~: NOT DETECTED
MDMA (Ecstasy)Ur Screen: NOT DETECTED
Methadone Scn, Ur: NOT DETECTED
Opiate, Ur Screen: NOT DETECTED
Phencyclidine (PCP) Ur S: NOT DETECTED
Tricyclic, Ur Screen: NOT DETECTED

## 2015-05-24 LAB — ACETAMINOPHEN LEVEL: Acetaminophen (Tylenol), Serum: <10 ug/mL — ABNORMAL LOW (ref 10–30)

## 2015-05-24 LAB — COMPREHENSIVE METABOLIC PANEL
ALT: 21 U/L (ref 14–54)
AST: 32 U/L (ref 15–41)
Albumin: 4.9 g/dL (ref 3.5–5.0)
Alkaline Phosphatase: 34 U/L — ABNORMAL LOW (ref 38–126)
Anion gap: 12 (ref 5–15)
BUN: 12 mg/dL (ref 6–20)
CO2: 22 mmol/L (ref 22–32)
Calcium: 9.5 mg/dL (ref 8.9–10.3)
Chloride: 103 mmol/L (ref 101–111)
Creatinine, Ser: 0.58 mg/dL (ref 0.44–1.00)
GFR calc Af Amer: >60 mL/min (ref >60)
GFR calc non Af Amer: >60 mL/min (ref >60)
Glucose, Bld: 97 mg/dL (ref 65–99)
Potassium: 4.1 mmol/L (ref 3.5–5.1)
Sodium: 137 mmol/L (ref 135–145)
Total Bilirubin: 0.4 mg/dL (ref 0.3–1.2)
Total Protein: 8 g/dL (ref 6.5–8.1)

## 2015-05-24 LAB — GLUCOSE, CAPILLARY: Glucose-Capillary: 148 mg/dL — ABNORMAL HIGH (ref 65–99)

## 2015-05-24 LAB — SALICYLATE LEVEL: Salicylate Lvl: <4 mg/dL (ref 2.8–30.0)

## 2015-05-24 LAB — ETHANOL: Alcohol, Ethyl (B): <5 mg/dL (ref <5)

## 2015-05-24 MED ORDER — CITALOPRAM HYDROBROMIDE 20 MG PO TABS
40.0000 mg | ORAL_TABLET | Freq: Every day | ORAL | Status: DC
  Administered 2015-05-24 – 2015-05-29 (×6): 40 mg via ORAL
  Filled 2015-05-24 (×6): qty 2

## 2015-05-24 MED ORDER — BUSPIRONE HCL 5 MG PO TABS
15.0000 mg | ORAL_TABLET | Freq: Two times a day (BID) | ORAL | Status: DC
  Administered 2015-05-24 – 2015-05-29 (×10): 15 mg via ORAL
  Filled 2015-05-24 (×7): qty 1
  Filled 2015-05-24: qty 2
  Filled 2015-05-24: qty 1

## 2015-05-24 MED ORDER — ACETAMINOPHEN 500 MG PO TABS
1000.0000 mg | ORAL_TABLET | Freq: Once | ORAL | Status: AC
  Administered 2015-05-24: 1000 mg via ORAL
  Filled 2015-05-24: qty 2

## 2015-05-24 NOTE — ED Notes (Signed)
Pt to ED via [place] with IVC papers, +SI, pt already has hospital scrubs in place, states she was just d/cd from hospital yesterday for same complaint

## 2015-05-24 NOTE — Consult Note (Signed)
[facility] Consult   Reason for Consult:  Consult for this 64 year old woman with history of PTSD and behavior problems who was referred here by mobile crisis Referring Physician:  Cinda Quest Patient Identification: Kendra Nicholson MRN:  [id number] Principal Diagnosis: PTSD (post-traumatic stress disorder) Diagnosis:   Patient Active Problem List   Diagnosis Date Noted  . PTSD (post-traumatic stress disorder) [F43.10] 05/09/2015  . Bipolar I disorder, current or most recent episode manic, with psychotic features with anxious distress [F31.2] 05/09/2015  . Hypertension [I10] 05/09/2015  . Diabetes [E11.9] 05/09/2015    Total Time spent with patient: 1 hour  Subjective:   Kendra Nicholson is a 64 y.o. female patient admitted with "they expected me to take a shower on that filthy mat".  HPI:  This is a 64 year old woman with a history of a diagnosis of PTSD but with some chronic behavior problems. She was just discharged from the psychiatric ward yesterday. She was sent to live at a group home. She was there less than a day before she insisted on speaking with the mobile crisis team. Patient tells me she has a list of grievances mostly about the hygiene of the place she was living. She complains about the inadequate air-conditioning. She complains about the dirty quality of the floors. She complains most bitterly about the fact that the bath mat that she would be forced to use appeared to be very dirty. She admits that she told mobile crisis that she would stick a fork into an electrical outlet in an attempt to kill herself if they did not take her out of there. She makes it clear to me that she does not want to die and that she did this strictly because she thought it was unacceptable to be in such a dirty environment. Her mood has been anxious and somewhat labile but not particularly depressed. Denies hallucinations. Denies any wish to die. Denies any wish to harm anyone else. Has  been compliant with her medicine. Not abusing drugs or alcohol. Main new stress is as noted above disliking the place she was sent to live.  Past psychiatric history: Patient had a history of 1 suicide attempt allegedly 20 years ago by overdose. No other suicide attempts since then. She evidently has had psychiatric treatment intermittently ever since. The diagnosis on her chart is posttraumatic stress disorder and bipolar disorder. The patient strikes me as being personality disordered and perhaps autistic or in some other way neurologically compromised. Nevertheless she does not appear to be severely depressed. She sells me that 20 years ago she saw some angels but has never had hallucinations since then. Her most recent medicines were BuSpar and Celexa and she is followed at [facility].  Social history: Has 2 adult children who she turns to for some assistance. She had been living in an assisted living facility but was put out of there last month. She was just placed in a new group home but obviously hated it.  Medical history: Patient has diabetes management with oral medicine. High blood pressure. Multiple pain complaints.  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 Hasn't used any drugs in 20 years.  Current medication: BuSpar 15 mg twice a day, Celexa 40 mg a day. Also takes Lotrimin, Norvasc, Glucotrol, Flonase, pravastatin and several pain medicines.2 HPI Elements:   Quality:  Anxiety and mild irritability. Severity:  Mild  to moderate does not appear to be actually life-threatening. Timing:  Escalated after discharge yesterday. Duration:  Resolved in the hospital. Context:  Disliking her group home.  Past Medical History:  Past Medical History  Diagnosis Date  . Anxiety   . Depression   . Hypertension   . Diabetes mellitus without complication    History reviewed. No pertinent  past surgical history. Family History: No family history on file. Social History:  History  Alcohol Use No     History  Drug Use Not on file    Social History   Social History  . Marital Status: Divorced    Spouse Name: N/A  . Number of Children: N/A  . Years of Education: N/A   Social History Main Topics  . Smoking status: Never Smoker   . Smokeless tobacco: None  . Alcohol Use: No  . Drug Use: None  . Sexual Activity: Not Asked   Other Topics Concern  . None   Social History Narrative   Additional Social History:    Pain Medications: See MARs Prescriptions: See MARs Over the Counter: See MARs History of alcohol / drug use?: No history of alcohol / drug abuse (no current drug use reported)                     Allergies:   Allergies  Allergen Reactions  . Bee Venom Anaphylaxis  . Demerol [Meperidine] Anaphylaxis  . Ivp Dye [Iodinated Diagnostic Agents] Anaphylaxis  . Shellfish Allergy Anaphylaxis  . Adhesive [Tape] Rash  . Iodine Rash    Labs:  Results for orders placed or performed during the hospital encounter of 05/24/15 (from the past 48 hour(s))  Comprehensive metabolic panel     Status: Abnormal   Collection Time: 05/24/15  1:52 PM  Result Value Ref Range   Sodium 137 135 - 145 mmol/L   Potassium 4.1 3.5 - 5.1 mmol/L   Chloride 103 101 - 111 mmol/L   CO2 22 22 - 32 mmol/L   Glucose, Bld 97 65 - 99 mg/dL   BUN 12 6 - 20 mg/dL   Creatinine, Ser 0.58 0.44 - 1.00 mg/dL   Calcium 9.5 8.9 - 10.3 mg/dL   Total Protein 8.0 6.5 - 8.1 g/dL   Albumin 4.9 3.5 - 5.0 g/dL   AST 32 15 - 41 U/L   ALT 21 14 - 54 U/L   Alkaline Phosphatase 34 (L) 38 - 126 U/L   Total Bilirubin 0.4 0.3 - 1.2 mg/dL   GFR calc non Af Amer >60 >60 mL/min   GFR calc Af Amer >60 >60 mL/min    Comment: (NOTE) The eGFR has been calculated using the CKD EPI equation. This calculation has not been validated in all clinical situations. eGFR's persistently <60 mL/min signify  possible Chronic Kidney Disease.    Anion gap 12 5 - 15  Ethanol (ETOH)     Status: None   Collection Time: 05/24/15  1:52 PM  Result Value Ref Range   Alcohol, Ethyl (B) <5 <5 mg/dL    Comment:        LOWEST DETECTABLE LIMIT FOR SERUM ALCOHOL IS 5 mg/dL FOR MEDICAL PURPOSES ONLY   Salicylate level     Status: None   Collection Time: 05/24/15  1:52 PM  Result Value Ref Range   Salicylate Lvl <1.6 2.8 - 30.0 mg/dL  Acetaminophen level     Status: Abnormal   Collection Time: 05/24/15  1:52 PM  Result  Value Ref Range   Acetaminophen (Tylenol), Serum <10 (L) 10 - 30 ug/mL    Comment:        THERAPEUTIC CONCENTRATIONS VARY SIGNIFICANTLY. A RANGE OF 10-30 ug/mL MAY BE AN EFFECTIVE CONCENTRATION FOR MANY PATIENTS. HOWEVER, SOME ARE BEST TREATED AT CONCENTRATIONS OUTSIDE THIS RANGE. ACETAMINOPHEN CONCENTRATIONS >150 ug/mL AT 4 HOURS AFTER INGESTION AND >50 ug/mL AT 12 HOURS AFTER INGESTION ARE OFTEN ASSOCIATED WITH TOXIC REACTIONS.   CBC     Status: None   Collection Time: 05/24/15  1:52 PM  Result Value Ref Range   WBC 8.6 3.6 - 11.0 K/uL   RBC 4.23 3.80 - 5.20 MIL/uL   Hemoglobin 13.6 12.0 - 16.0 g/dL   HCT 39.1 35.0 - 47.0 %   MCV 92.6 80.0 - 100.0 fL   MCH 32.3 26.0 - 34.0 pg   MCHC 34.9 32.0 - 36.0 g/dL   RDW 12.1 11.5 - 14.5 %   Platelets 293 150 - 440 K/uL  Urine Drug Screen, Qualitative ([facility] only)     Status: None   Collection Time: 05/24/15  1:52 PM  Result Value Ref Range   Tricyclic, Ur Screen NONE DETECTED NONE DETECTED   Amphetamines, Ur Screen NONE DETECTED NONE DETECTED   MDMA (Ecstasy)Ur Screen NONE DETECTED NONE DETECTED   Cocaine Metabolite,Ur [place] NONE DETECTED NONE DETECTED   Opiate, Ur Screen NONE DETECTED NONE DETECTED   Phencyclidine (PCP) Ur S NONE DETECTED NONE DETECTED   Cannabinoid 50 Ng, Ur [place] NONE DETECTED NONE DETECTED   Barbiturates, Ur Screen NONE DETECTED NONE DETECTED   Benzodiazepine, Ur Scrn NONE DETECTED NONE DETECTED   Methadone  Scn, Ur NONE DETECTED NONE DETECTED    Comment: (NOTE) 951  Tricyclics, urine               Cutoff 1000 ng/mL 200  Amphetamines, urine             Cutoff 1000 ng/mL 300  MDMA (Ecstasy), urine           Cutoff 500 ng/mL 400  Cocaine Metabolite, urine       Cutoff 300 ng/mL 500  Opiate, urine                   Cutoff 300 ng/mL 600  Phencyclidine (PCP), urine      Cutoff 25 ng/mL 700  Cannabinoid, urine              Cutoff 50 ng/mL 800  Barbiturates, urine             Cutoff 200 ng/mL 900  Benzodiazepine, urine           Cutoff 200 ng/mL 1000 Methadone, urine                Cutoff 300 ng/mL 1100 1200 The urine drug screen provides only a preliminary, unconfirmed 1300 analytical test result and should not be used for non-medical 1400 purposes. Clinical consideration and professional judgment should 1500 be applied to any positive drug screen result due to possible 1600 interfering substances. A more specific alternate chemical method 1700 must be used in order to obtain a confirmed analytical result.  1800 Gas chromato graphy / mass spectrometry (GC/MS) is the preferred 1900 confirmatory method.     Vitals: Pulse 99, temperature 98.3 F (36.8 C), temperature source Oral, resp. rate 18, height 5' 6"  (1.676 m), weight 84.369 kg (186 lb), SpO2 95 %.  Risk to Self: Suicidal Ideation: Yes-Currently Present Suicidal Intent:  Yes-Currently Present Is patient at risk for suicide?: Yes Suicidal Plan?: Yes-Currently Present Access to Means: No What has been your use of drugs/alcohol within the last 12 months?: na How many times?: 1 Other Self Harm Risks: banging head on wall Triggers for Past Attempts: Unpredictable, Other (Comment) Intentional Self Injurious Behavior: Damaging (banging head on wall) Comment - Self Injurious Behavior: banging head Risk to Others: Homicidal Ideation: No-Not Currently/Within Last 6 Months Thoughts of Harm to Others: No-Not Currently Present/Within Last 6  Months Current Homicidal Intent: No-Not Currently/Within Last 6 Months Current Homicidal Plan: No-Not Currently/Within Last 6 Months Access to Homicidal Means: No Identified Victim: na History of harm to others?: Yes (per patient she was accused of pushing a peer) Assessment of Violence: None Noted Violent Behavior Description: banging head,  Does patient have access to weapons?: No Criminal Charges Pending?: No Does patient have a court date: No Prior Inpatient Therapy: Prior Inpatient Therapy: Yes Prior Therapy Dates:  years ago  Prior Therapy Facilty/Provider(s): [place] Reason for Treatment: Post concussion, and suicide attempt via overdose  Prior Outpatient Therapy: Prior Outpatient Therapy: Yes Prior Therapy Dates: three plus years Prior Therapy Facilty/Provider(s): [facility], Dr. Ernie Hew, and [facility] Gerold Reason for Treatment: medication management, therapy  Does patient have an ACCT team?: No Does patient have Intensive In-House Services?  : No Does patient have [facility] services? : No Does patient have P4CC services?: No  Current Facility-Administered Medications  Medication Dose Route Frequency Provider Last Rate Last Dose  . busPIRone (BUSPAR) tablet 15 mg  15 mg Oral BID Gonzella Lex, MD      . citalopram (CELEXA) tablet 40 mg  40 mg Oral Daily Gonzella Lex, MD       Current Outpatient Prescriptions  Medication Sig Dispense Refill  . acetaminophen (TYLENOL) 325 MG tablet Take 2 tablets (650 mg total) by mouth every 6 (six) hours as needed for mild pain. 120 tablet 0  . amLODipine (NORVASC) 10 MG tablet Take 1 tablet (10 mg total) by mouth daily. 30 tablet 0  . busPIRone (BUSPAR) 15 MG tablet Take 1 tablet (15 mg total) by mouth 2 (two) times daily. 60 tablet 0  . carbidopa-levodopa (SINEMET IR) 25-100 MG per tablet Take 1 tablet by mouth 3 (three) times daily. 90 tablet 0  . citalopram (CELEXA) 40 MG tablet Take 1 tablet (40 mg total) by mouth daily. 30 tablet 0  .  clotrimazole (LOTRIMIN) 1 % cream Apply 1 application topically 2 (two) times daily as needed (for rash). 30 g 0  . EPINEPHrine (EPIPEN 2-PAK) 0.3 mg/0.3 mL IJ SOAJ injection Inject 0.3 mLs (0.3 mg total) into the muscle once as needed (for severe allergic reaction). 1 Device 0  . estradiol (ESTRACE) 1 MG tablet Take 1 tablet (1 mg total) by mouth daily. Pt does not take on Wednesday and Saturday. 30 tablet 0  . fluticasone (FLONASE) 50 MCG/ACT nasal spray Place 2 sprays into both nostrils daily. 16 g 0  . glipiZIDE (GLUCOTROL) 5 MG tablet Take 1 tablet (5 mg total) by mouth daily. 30 tablet 0  . lidocaine (LIDODERM) 5 % Place 1 patch onto the skin daily. Remove & Discard patch within 12 hours or as directed by MD 30 patch 0  . metFORMIN (GLUCOPHAGE) 1000 MG tablet Take 1 tablet (1,000 mg total) by mouth 2 (two) times daily with a meal. 60 tablet 0  . metoprolol succinate (TOPROL-XL) 50 MG 24 hr tablet Take 1 tablet (50 mg total)  by mouth daily. 30 tablet 0  . Multiple Vitamin (THEREMS) TABS Take 1 tablet by mouth daily. 30 tablet 0  . pravastatin (PRAVACHOL) 20 MG tablet Take 1 tablet (20 mg total) by mouth at bedtime. 30 tablet 0  . QUEtiapine (SEROQUEL) 50 MG tablet Take 3 tablets (150 mg total) by mouth at bedtime. 30 tablet 0  . rizatriptan (MAXALT) 5 MG tablet Take 1 tablet (5 mg total) by mouth as needed for migraine. May repeat in 2 hours if needed 10 tablet 0  . tiZANidine (ZANAFLEX) 2 MG tablet Take 1 tablet (2 mg total) by mouth 2 (two) times daily. 60 tablet 0    Musculoskeletal: Strength & Muscle Tone: within normal limits Gait & Station: normal Patient leans: N/A  Psychiatric Specialty Exam: Physical Exam  Nursing note and vitals reviewed. Constitutional: She appears well-developed and well-nourished.  HENT:  Head: Normocephalic and atraumatic.  Eyes: Conjunctivae are normal. Pupils are equal, round, and reactive to light.  Neck: Normal range of motion.  Cardiovascular:  Normal heart sounds.   Respiratory: Effort normal.  GI: Soft.  Musculoskeletal: Normal range of motion.  Neurological: She is alert.  Skin: Skin is warm and dry.  Psychiatric: Her behavior is normal. Thought content normal. Her affect is inappropriate. Her speech is slurred. Cognition and memory are impaired. She expresses impulsivity.    Review of Systems  Constitutional: Negative.   HENT: Negative.   Eyes: Negative.   Respiratory: Negative.   Cardiovascular: Negative.   Gastrointestinal: Negative.   Musculoskeletal: Negative.   Skin: Negative.   Neurological: Negative.   Psychiatric/Behavioral: Negative for depression, suicidal ideas, hallucinations, memory loss and substance abuse. The patient is not nervous/anxious and does not have insomnia.     Pulse 99, temperature 98.3 F (36.8 C), temperature source Oral, resp. rate 18, height 5' 6"  (1.676 m), weight 84.369 kg (186 lb), SpO2 95 %.Body mass index is 30.04 kg/(m^2).  General Appearance: Disheveled  Engineer, water::  Fair  Speech:  Normal Rate  Volume:  Normal  Mood:  Euthymic  Affect:  Appropriate  Thought Process:  Goal Directed  Orientation:  Full (Time, Place, and Person)  Thought Content:  Negative  Suicidal Thoughts:  No  Homicidal Thoughts:  No  Memory:  Immediate;   Fair Recent;   Fair Remote;   Fair  Judgement:  Impaired  Insight:  Lacking  Psychomotor Activity:  Decreased  Concentration:  Fair  Recall:  [employer] of Knowledge:Fair  Language: Fair  Akathisia:  No  Handed:  Right  AIMS (if indicated):     Assets:  Communication Skills Desire for Improvement Financial Resources/Insurance Resilience Social Support  ADL's:  Intact  Cognition: Impaired,  Mild  Sleep:      Medical Decision Making: Established Problem, Stable/Improving (1), Review of Psycho-Social Stressors (1), Review or order clinical lab tests (1) and Review of Medication Regimen & Side Effects (2)  Treatment Plan Summary: Plan  Patient described suicidal ideation in order to get out of her group home. She is very clear that she is not suicidal now. Not depressed. Not manic. Not psychotic. Asian is at her baseline in terms of mental health. Does not meet commitment criteria. I discontinued IVC. No change to current medicine. Case discussed with social worker here and they are going to work on perhaps trying to find a new living facility for her. Continue her current psychiatric medicine.  Plan:  No evidence of imminent risk to self or others at  present.   Patient does not meet criteria for psychiatric inpatient admission. Supportive therapy provided about ongoing stressors. Disposition: Discharge once there is a reasonable plan in place  [place] 05/24/2015 5:07 PM

## 2015-05-24 NOTE — ED Notes (Signed)
BEHAVIORAL HEALTH ROUNDING Patient sleeping: No. Patient alert and oriented: yes Behavior appropriate: Yes.  ; If no, describe:  Nutrition and fluids offered: yes Toileting and hygiene offered: Yes  Sitter present: q15 minute observations and security camera monitoring Law enforcement present: Yes  ODS  

## 2015-05-24 NOTE — Progress Notes (Signed)
  [facility] Adult Case Management Discharge Plan :  Will you be returning to the same living situation after discharge:  No. Patient may not return to previous facility and has a new placement at [facility] in [place]. [phone] At discharge, do you have transportation home?: Yes,  [facility] will pick up Do you have the ability to pay for your medications: Yes,  patient has insurance  Release of information consent forms completed and in the chart;  Patient's signature needed at discharge.  Patient to Follow up at: Follow-up Information    Follow up with [facility]. Go on 05/27/2015.   Why:  For follow-up care appt Monday 05/27/15 at 8:00am   Contact information:   [place], [place] Ph [phone] Fax [phone]      Patient denies SI/HI: Yes,  patient denies SI/HI    Safety Planning and Suicide Prevention discussed: Yes,  SPE discussed with patient and her son-in-law Sherren Mocha [phone]  Have you used any form of tobacco in the last 30 days? (Cigarettes, Smokeless Tobacco, Cigars, and/or Pipes): No  Has patient been referred to the Quitline?: N/A patient is not a smoker  Keene Breath, MSW, LCSWA 05/24/2015, 8:51 AM

## 2015-05-24 NOTE — ED Provider Notes (Signed)
[facility] Emergency Department Provider Note  Time seen: 2:58 PM  I have reviewed the triage vital signs and the nursing notes.   HISTORY  Chief Complaint involuntary commitment     HPI Kendra Nicholson is a 64 y.o. female with a past medical history of anxiety, depression, diabetes, hypertension presents the emergency department under an involuntary commitment for suicidal ideation. According to the patient she does not like the nursing home she is at, states it is very dirty with mold everywhere so she is going to kill herself by sticking a fork into an outlet. Patient states they tried to force her to take a shower but there is mold in the shower and she is allergic to mold so she said she was going to kill her self. Denies any medical complaints. Denies any pain. Denies any substance use.    Past Medical History  Diagnosis Date  . Anxiety   . Depression   . Hypertension   . Diabetes mellitus without complication     Patient Active Problem List   Diagnosis Date Noted  . PTSD (post-traumatic stress disorder) 05/09/2015  . Bipolar I disorder, current or most recent episode manic, with psychotic features with anxious distress 05/09/2015  . Hypertension 05/09/2015  . Diabetes 05/09/2015    History reviewed. No pertinent past surgical history.  Current Outpatient Rx  Name  Route  Sig  Dispense  Refill  . acetaminophen (TYLENOL) 325 MG tablet   Oral   Take 2 tablets (650 mg total) by mouth every 6 (six) hours as needed for mild pain.   120 tablet   0   . amLODipine (NORVASC) 10 MG tablet   Oral   Take 1 tablet (10 mg total) by mouth daily.   30 tablet   0   . busPIRone (BUSPAR) 15 MG tablet   Oral   Take 1 tablet (15 mg total) by mouth 2 (two) times daily.   60 tablet   0   . carbidopa-levodopa (SINEMET IR) 25-100 MG per tablet   Oral   Take 1 tablet by mouth 3 (three) times daily.   90 tablet   0   . citalopram (CELEXA) 40 MG  tablet   Oral   Take 1 tablet (40 mg total) by mouth daily.   30 tablet   0   . clotrimazole (LOTRIMIN) 1 % cream   Topical   Apply 1 application topically 2 (two) times daily as needed (for rash).   30 g   0   . EPINEPHrine (EPIPEN 2-PAK) 0.3 mg/0.3 mL IJ SOAJ injection   Intramuscular   Inject 0.3 mLs (0.3 mg total) into the muscle once as needed (for severe allergic reaction).   1 Device   0   . estradiol (ESTRACE) 1 MG tablet   Oral   Take 1 tablet (1 mg total) by mouth daily. Pt does not take on Wednesday and Saturday.   30 tablet   0   . fluticasone (FLONASE) 50 MCG/ACT nasal spray   Each Nare   Place 2 sprays into both nostrils daily.   16 g   0   . glipiZIDE (GLUCOTROL) 5 MG tablet   Oral   Take 1 tablet (5 mg total) by mouth daily.   30 tablet   0   . lidocaine (LIDODERM) 5 %   Transdermal   Place 1 patch onto the skin daily. Remove & Discard patch within 12 hours or as directed by  MD   30 patch   0   . metFORMIN (GLUCOPHAGE) 1000 MG tablet   Oral   Take 1 tablet (1,000 mg total) by mouth 2 (two) times daily with a meal.   60 tablet   0   . metoprolol succinate (TOPROL-XL) 50 MG 24 hr tablet   Oral   Take 1 tablet (50 mg total) by mouth daily.   30 tablet   0   . Multiple Vitamin (THEREMS) TABS   Oral   Take 1 tablet by mouth daily.   30 tablet   0   . pravastatin (PRAVACHOL) 20 MG tablet   Oral   Take 1 tablet (20 mg total) by mouth at bedtime.   30 tablet   0   . QUEtiapine (SEROQUEL) 50 MG tablet   Oral   Take 3 tablets (150 mg total) by mouth at bedtime.   30 tablet   0   . rizatriptan (MAXALT) 5 MG tablet   Oral   Take 1 tablet (5 mg total) by mouth as needed for migraine. May repeat in 2 hours if needed   10 tablet   0   . tiZANidine (ZANAFLEX) 2 MG tablet   Oral   Take 1 tablet (2 mg total) by mouth 2 (two) times daily.   60 tablet   0     Allergies Bee venom; Demerol; Ivp dye; Shellfish allergy; Adhesive; and  Iodine  No family history on file.  Social History Social History  Substance Use Topics  . Smoking status: Never Smoker   . Smokeless tobacco: None  . Alcohol Use: No    Review of Systems Constitutional: Negative for fever. Cardiovascular: Negative for chest pain. Respiratory: Negative for shortness of breath. Gastrointestinal: Negative for abdominal pain Neurological: Negative for headaches, focal weakness or numbness. Psychiatric:SI with plan 10-point ROS otherwise negative.  ____________________________________________   PHYSICAL EXAM:  VITAL SIGNS: ED Triage Vitals  Enc Vitals Group     BP --      Pulse Rate 05/24/15 1347 99     Resp 05/24/15 1347 18     Temp 05/24/15 1347 98.3 F (36.8 C)     Temp Source 05/24/15 1347 Oral     SpO2 05/24/15 1347 95 %     Weight 05/24/15 1347 186 lb (84.369 kg)     Height 05/24/15 1347 5\' 6"  (1.676 m)     Head Cir --      Peak Flow --      Pain Score 05/24/15 1348 0     Pain Loc --      Pain Edu? --      Excl. in [place]? --     Constitutional: Alert and oriented. Well appearing and in no distress. Eyes: Normal exam ENT   Mouth/Throat: Mucous membranes are moist. Cardiovascular: Normal rate, regular rhythm. No murmur Respiratory: Normal respiratory effort without tachypnea nor retractions. Breath sounds are clear Gastrointestinal: Soft and nontender. No distention.   Musculoskeletal: Nontender with normal range of motion in all extremities.  Neurologic:  Normal speech and language. No gross focal neurologic deficits are appreciated. Speech is normal. Skin:  Skin is warm, dry and intact.  Psychiatric: Suicidal ideation with plan.  ____________________________________________    INITIAL IMPRESSION / ASSESSMENT AND PLAN / ED COURSE  Pertinent labs & imaging results that were available during my care of the patient were reviewed by me and considered in my medical decision making (see chart for details).  We will  continue involuntary commitment until the patient can be adequately evaluated by psychiatry. Patient states she has a plan to stick a for internal laterality to kill herself if we discharge her home.  ____________________________________________   FINAL CLINICAL IMPRESSION(S) / ED DIAGNOSES  Suicidal ideation Depression   Harvest Dark, MD 05/24/15 [phone]

## 2015-05-24 NOTE — ED Notes (Signed)
Pt requests tylenol for arthritis in right knee and fibromyalgia in left knee both 10/10

## 2015-05-24 NOTE — ED Notes (Signed)
Supper provided along with an extra drink   She is sitting up in bed    Appropriate to stimulation  No verbalized needs or concerns at this time  NAD assessed  Continue to monitor

## 2015-05-24 NOTE — ED Provider Notes (Addendum)
  Physical Exam  Pulse 99  Temp(Src) 98.3 F (36.8 C) (Oral)  Resp 18  Ht 5\' 6"  (1.676 m)  Wt 186 lb (84.369 kg)  BMI 30.04 kg/m2  SpO2 95%  Physical Exam  ED Course  Procedures  MDM Dr. Weber Cooks rescinded IVC. Patient likely unhappy with group home. Social work is working on placing her in another home. Stable for discharge   7:06 PM Nurse tried to contact group home but group home refuses to take her back. Patient also doesn't want to go back either. Will have social work place patient to another facility in the morning.    Wandra Arthurs, MD 05/24/15 1807  Wandra Arthurs, MD 05/24/15 [phone]

## 2015-05-24 NOTE — ED Notes (Signed)
Pt placed in room 20  Patient assigned to appropriate care area. Patient oriented to unit/care area: Informed that, for their safety, care areas are designed for safety and monitored by security cameras at all times; Visiting hours and phone times explained to patient. Patient verbalizes understanding, and verbal contract for safety obtained.

## 2015-05-24 NOTE — ED Notes (Signed)
Pt given the remote control    Appropriate to stimulation  No verbalized needs or concerns at this time  NAD assessed  Continue to monitor
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
## 2015-05-24 NOTE — Progress Notes (Signed)
Recreation Therapy Notes  INPATIENT RECREATION TR PLAN  Patient Details Name: Kendra Nicholson MRN: [id number] DOB: November 29, 1950 Today's Date: 05/24/2015  Rec Therapy Plan Is patient appropriate for Therapeutic Recreation?: Yes Treatment times per week: At least once a week TR Treatment/Interventions: 1:1 session, Group participation (Comment) (Appropriate participation in daily recreation therapy )  Discharge Criteria Pt will be discharged from therapy if:: Discharged Treatment plan/goals/alternatives discussed and agreed upon by:: Patient/family  Discharge Summary Short term goals set: See Care Plan Short term goals met: Complete Progress toward goals comments: One-to-one attended Which groups?: Self-esteem, Coping skills, Leisure education, Goal setting, Other (Comment) (Self-expression) One-to-one attended: Self-esteem, stress management  Reason goals not met: N/A Therapeutic equipment acquired: None Reason patient discharged from therapy: Discharge from hospital Pt/family agrees with progress & goals achieved: Yes Date patient discharged from therapy: 05/23/15   Leonette [facility], LRT/CTRS 05/24/2015, 5:48 PM

## 2015-05-25 DIAGNOSIS — F3130 Bipolar disorder, current episode depressed, mild or moderate severity, unspecified: Secondary | ICD-10-CM | POA: Diagnosis not present

## 2015-05-25 LAB — GLUCOSE, CAPILLARY
Glucose-Capillary: 221 mg/dL — ABNORMAL HIGH (ref 65–99)
Glucose-Capillary: 128 mg/dL — ABNORMAL HIGH (ref 65–99)
Glucose-Capillary: 113 mg/dL — ABNORMAL HIGH (ref 65–99)

## 2015-05-25 MED ORDER — METFORMIN HCL 500 MG PO TABS
1000.0000 mg | ORAL_TABLET | Freq: Two times a day (BID) | ORAL | Status: DC
  Administered 2015-05-25 – 2015-05-29 (×9): 1000 mg via ORAL
  Filled 2015-05-25 (×5): qty 2

## 2015-05-25 MED ORDER — AMLODIPINE BESYLATE 5 MG PO TABS
ORAL_TABLET | ORAL | Status: AC
  Filled 2015-05-25: qty 2

## 2015-05-25 MED ORDER — METOPROLOL SUCCINATE ER 50 MG PO TB24
50.0000 mg | ORAL_TABLET | Freq: Every day | ORAL | Status: DC
  Administered 2015-05-25 – 2015-05-29 (×5): 50 mg via ORAL
  Filled 2015-05-25 (×3): qty 1

## 2015-05-25 MED ORDER — PRAVASTATIN SODIUM 20 MG PO TABS
20.0000 mg | ORAL_TABLET | Freq: Every day | ORAL | Status: DC
  Administered 2015-05-25 – 2015-05-28 (×4): 20 mg via ORAL
  Filled 2015-05-25: qty 1

## 2015-05-25 MED ORDER — TIZANIDINE HCL 4 MG PO TABS
2.0000 mg | ORAL_TABLET | Freq: Two times a day (BID) | ORAL | Status: DC
  Administered 2015-05-25 – 2015-05-28 (×8): 2 mg via ORAL
  Filled 2015-05-25 (×3): qty 1

## 2015-05-25 MED ORDER — QUETIAPINE FUMARATE 300 MG PO TABS
ORAL_TABLET | ORAL | Status: AC
  Administered 2015-05-25: 150 mg via ORAL
  Filled 2015-05-25: qty 1

## 2015-05-25 MED ORDER — TIZANIDINE HCL 4 MG PO TABS
ORAL_TABLET | ORAL | Status: AC
  Administered 2015-05-25: 2 mg via ORAL
  Filled 2015-05-25: qty 1

## 2015-05-25 MED ORDER — ACETAMINOPHEN 500 MG PO TABS
1000.0000 mg | ORAL_TABLET | Freq: Four times a day (QID) | ORAL | Status: DC | PRN
  Administered 2015-05-25 – 2015-05-28 (×6): 1000 mg via ORAL
  Filled 2015-05-25 (×4): qty 2

## 2015-05-25 MED ORDER — PRAVASTATIN SODIUM 20 MG PO TABS
ORAL_TABLET | ORAL | Status: AC
  Administered 2015-05-25: 20 mg via ORAL
  Filled 2015-05-25: qty 1

## 2015-05-25 MED ORDER — QUETIAPINE FUMARATE 25 MG PO TABS
150.0000 mg | ORAL_TABLET | Freq: Every day | ORAL | Status: DC
  Administered 2015-05-25 – 2015-05-28 (×4): 150 mg via ORAL
  Filled 2015-05-25: qty 6

## 2015-05-25 MED ORDER — METOPROLOL SUCCINATE ER 50 MG PO TB24
ORAL_TABLET | ORAL | Status: AC
  Filled 2015-05-25: qty 1

## 2015-05-25 MED ORDER — GLIPIZIDE 10 MG PO TABS
5.0000 mg | ORAL_TABLET | Freq: Every day | ORAL | Status: DC
  Administered 2015-05-25 – 2015-05-29 (×5): 5 mg via ORAL
  Filled 2015-05-25 (×3): qty 1

## 2015-05-25 MED ORDER — AMLODIPINE BESYLATE 5 MG PO TABS
10.0000 mg | ORAL_TABLET | Freq: Every day | ORAL | Status: DC
  Administered 2015-05-25 – 2015-05-29 (×5): 10 mg via ORAL
  Filled 2015-05-25 (×3): qty 2

## 2015-05-25 MED ORDER — METFORMIN HCL 500 MG PO TABS
ORAL_TABLET | ORAL | Status: AC
  Filled 2015-05-25: qty 2

## 2015-05-25 MED ORDER — CARBIDOPA-LEVODOPA 25-100 MG PO TABS
1.0000 | ORAL_TABLET | Freq: Three times a day (TID) | ORAL | Status: DC
  Administered 2015-05-25 – 2015-05-29 (×13): 1 via ORAL
  Filled 2015-05-25 (×15): qty 1

## 2015-05-25 MED ORDER — METFORMIN HCL 500 MG PO TABS
ORAL_TABLET | ORAL | Status: AC
  Administered 2015-05-27: 1000 mg via ORAL
  Filled 2015-05-25: qty 2

## 2015-05-25 MED ORDER — CITALOPRAM HYDROBROMIDE 20 MG PO TABS: 40.0000 mg | ORAL_TABLET | Freq: Every day | ORAL | Status: DC

## 2015-05-25 MED ORDER — ACETAMINOPHEN 500 MG PO TABS
ORAL_TABLET | ORAL | Status: AC
  Filled 2015-05-25: qty 2

## 2015-05-25 MED ORDER — TIZANIDINE HCL 4 MG PO TABS
ORAL_TABLET | ORAL | Status: AC
  Filled 2015-05-25: qty 1

## 2015-05-25 MED ORDER — GLIPIZIDE 10 MG PO TABS
ORAL_TABLET | ORAL | Status: AC
  Filled 2015-05-25: qty 1

## 2015-05-25 NOTE — ED Notes (Signed)
Patient assigned to appropriate care area. Patient oriented to unit/care area: Informed that, for their safety, care areas are designed for safety and monitored by security cameras at all times; and visiting hours explained to patient. Patient verbalizes understanding, and verbal contract for safety obtained. 

## 2015-05-25 NOTE — ED Notes (Signed)
BEHAVIORAL HEALTH ROUNDING Patient sleeping: No. Patient alert and oriented: yes Behavior appropriate: Yes.  ;  Nutrition and fluids offered: Yes  Toileting and hygiene offered: Yes  Sitter present: yes Law enforcement present: Yes  

## 2015-05-25 NOTE — ED Notes (Addendum)
BEHAVIORAL HEALTH ROUNDING Patient sleeping: Yes.   Patient alert and oriented: not applicable Behavior appropriate: Yes.  ; If no, describe: sleeping Nutrition and fluids offered: No Toileting and hygiene offered: No Sitter present: no Law enforcement present: Yes  and ODS

## 2015-05-25 NOTE — ED Notes (Signed)
BEHAVIORAL HEALTH ROUNDING Patient sleeping: No. Patient alert and oriented: yes Behavior appropriate: Yes.  ; If no, describe: pt to toilet and drinking tap water Nutrition and fluids offered: Yes  Toileting and hygiene offered: Yes  Sitter present: NO Law enforcement present: Yes  and ODS

## 2015-05-25 NOTE — ED Notes (Signed)
BEHAVIORAL HEALTH ROUNDING Patient sleeping: Yes.   Patient alert and oriented: not applicable Behavior appropriate: Yes.  ; If no, describe: sleeping Nutrition and fluids offered: No Toileting and hygiene offered: No Sitter present: no Law enforcement present: Yes  and ODS

## 2015-05-25 NOTE — ED Notes (Signed)
Report given to Rachel, RN.

## 2015-05-25 NOTE — ED Notes (Signed)
ED BHU [place] Is the patient under IVC or is there intent for IVC: No. Is the patient medically cleared: Yes.   Is there vacancy in the ED BHU: Yes.   Is the population mix appropriate for patient: Yes.   Is the patient awaiting placement in inpatient or outpatient setting: Yes.   Has the patient had a psychiatric consult: Yes.   Survey of unit performed for contraband, proper placement and condition of furniture, tampering with fixtures in bathroom, shower, and each patient room: Yes.  ; Findings: na APPEARANCE/BEHAVIOR calm NEURO ASSESSMENT Orientation: time Hallucinations: No.None noted (Hallucinations) Speech: Normal Gait: normal RESPIRATORY ASSESSMENT Normal expansion.  Clear to auscultation.  No rales, rhonchi, or wheezing. CARDIOVASCULAR ASSESSMENT regular rate and rhythm, S1, S2 normal, no murmur, click, rub or gallop GASTROINTESTINAL ASSESSMENT soft, nontender, BS WNL, no r/g EXTREMITIES normal strength, tone, and muscle mass PLAN OF CARE Provide calm/safe environment. Vital signs assessed twice daily. ED BHU Assessment once each 12-hour shift. Collaborate with intake RN daily or as condition indicates. Assure the ED provider has rounded once each shift. Provide and encourage hygiene. Provide redirection as needed. Assess for escalating behavior; address immediately and inform ED provider.  Assess family dynamic and appropriateness for visitation as needed: Yes.  ; If necessary, describe findings: na Educate the patient/family about BHU procedures/visitation: Yes.  ; If necessary, describe findings: na

## 2015-05-25 NOTE — ED Notes (Signed)
Pt ate 100% of lunch  BEHAVIORAL HEALTH ROUNDING Patient sleeping: No. Patient alert and oriented: yes Behavior appropriate: Yes.   Nutrition and fluids offered: Yes  Toileting and hygiene offered: Yes  Sitter present: yes Law enforcement present: Yes

## 2015-05-25 NOTE — ED Notes (Addendum)

## 2015-05-25 NOTE — ED Notes (Signed)
Pt given breakfast tray. Vitals taken. FSBS 221

## 2015-05-25 NOTE — ED Notes (Addendum)
BEHAVIORAL HEALTH ROUNDING Patient sleeping: No. Patient alert and oriented: yes Behavior appropriate: Yes.  ; If no, describe: n/a Nutrition and fluids offered: Yes  Toileting and hygiene offered: Yes  Sitter present: yes Law enforcement present: Yes  

## 2015-05-25 NOTE — ED Provider Notes (Signed)
-----------------------------------------   6:52 AM on 05/25/2015 -----------------------------------------   Blood pressure 160/75, pulse 80, temperature 98.2 F (36.8 C), temperature source Oral, resp. rate 18, height 5\' 6"  (1.676 m), weight 186 lb (84.369 kg), SpO2 98 %.  The patient had no acute events since last update.  Calm and cooperative at this time.  Disposition is pending per social work team recommendations.     Paulette Blanch, MD 05/25/15 [phone]

## 2015-05-25 NOTE — ED Notes (Addendum)
BEHAVIORAL HEALTH ROUNDING Patient sleeping: No. Patient alert and oriented: yes Behavior appropriate: Yes.  ; If no, describe: pt eating snack Nutrition and fluids offered: Yes  Toileting and hygiene offered: Yes  Sitter present: no Law enforcement present: Yes  and ODS  ENVIRONMENTAL ASSESSMENT Potentially harmful objects out of patient reach: Yes.   Personal belongings secured: Yes.   Patient dressed in hospital provided attire only: Yes.   Plastic bags out of patient reach: Yes.   Patient care equipment (cords, cables, call bells, lines, and drains) shortened, removed, or accounted for: Yes.   Equipment and supplies removed from bottom of stretcher: Yes.   Potentially toxic materials out of patient reach: Yes.   Sharps container removed or out of patient reach: Yes.

## 2015-05-25 NOTE — ED Notes (Signed)
BEHAVIORAL HEALTH ROUNDING Patient sleeping: No. Patient alert and oriented: yes Behavior appropriate: Yes.  ; If no, describe: n/a Nutrition and fluids offered: Yes  Toileting and hygiene offered: Yes  Sitter present: yes Law enforcement present: Yes  

## 2015-05-25 NOTE — ED Notes (Signed)
Pt resting in room, pt in no distress

## 2015-05-25 NOTE — ED Notes (Signed)
Per Dr Darl Householder, DC one-to-one sitter and initiate Q98min checks

## 2015-05-25 NOTE — ED Notes (Signed)
BEHAVIORAL HEALTH ROUNDING Patient sleeping: Yes.   Patient alert and oriented: not applicable Behavior appropriate: Yes.  ; If no, describe: sleeping Nutrition and fluids offered: No Toileting and hygiene offered: No Sitter present: no, q 15 minute rounds Law enforcement present: Yes ODS

## 2015-05-25 NOTE — ED Notes (Signed)

## 2015-05-25 NOTE — ED Notes (Addendum)
ED BHU [place] Is the patient under IVC or is there intent for IVC: No. Is the patient medically cleared: Yes.   Is there vacancy in the ED BHU: Yes.   Is the population mix appropriate for patient: Yes.   Is the patient awaiting placement in inpatient or outpatient setting: Yes.   Has the patient had a psychiatric consult: Yes.   Survey of unit performed for contraband, proper placement and condition of furniture, tampering with fixtures in bathroom, shower, and each patient room: Yes.  ; Findings: na APPEARANCE/BEHAVIOR calm, cooperative and adequate rapport can be established NEURO ASSESSMENT Orientation: time, place and person Hallucinations: No.None noted (Hallucinations) Speech: Normal Gait: normal RESPIRATORY ASSESSMENT Normal expansion.  Clear to auscultation.  No rales, rhonchi, or wheezing. CARDIOVASCULAR ASSESSMENT regular rate and rhythm, S1, S2 normal, no murmur, click, rub or gallop GASTROINTESTINAL ASSESSMENT soft, nontender, BS WNL, no r/g EXTREMITIES normal strength, tone, and muscle mass PLAN OF CARE Provide calm/safe environment. Vital signs assessed twice daily. ED BHU Assessment once each 12-hour shift. Collaborate with intake RN daily or as condition indicates. Assure the ED provider has rounded once each shift. Provide and encourage hygiene. Provide redirection as needed. Assess for escalating behavior; address immediately and inform ED provider.  Assess family dynamic and appropriateness for visitation as needed: Yes.  ; If necessary, describe findings: na Educate the patient/family about BHU procedures/visitation: Yes.  ; If necessary, describe findings: na

## 2015-05-25 NOTE — ED Notes (Signed)
ED BHU [place] Is the patient under IVC or is there intent for IVC: No. Is the patient medically cleared: Yes.   Is there vacancy in the ED BHU: Yes.   Is the population mix appropriate for patient: Yes.   Is the patient awaiting placement in inpatient or outpatient setting: Yes.   Has the patient had a psychiatric consult: Yes.   Survey of unit performed for contraband, proper placement and condition of furniture, tampering with fixtures in bathroom, shower, and each patient room: yes; Findings: Clear APPEARANCE/BEHAVIOR calm, cooperative and adequate rapport can be established NEURO ASSESSMENT Orientation: time, place and person Hallucinations: No.None noted (Hallucinations) Speech: Normal Gait: normal, broad based RESPIRATORY ASSESSMENT Normal expansion.  Clear to auscultation.  No rales, rhonchi, or wheezing., No chest wall tenderness., No kyphosis or scoliosis. CARDIOVASCULAR ASSESSMENT regular rate and rhythm, S1, S2 normal, no murmur, click, rub or gallop GASTROINTESTINAL ASSESSMENT soft, nontender, BS WNL, no r/g EXTREMITIES normal strength, tone, and muscle mass PLAN OF CARE Provide calm/safe environment. Vital signs assessed twice daily. ED BHU Assessment once each 12-hour shift. Collaborate with intake RN daily or as condition indicates. Assure the ED provider has rounded once each shift. Provide and encourage hygiene. Provide redirection as needed. Assess for escalating behavior; address immediately and inform ED provider.  Assess family dynamic and appropriateness for visitation as needed: No.; If necessary, describe findings: Unable to assess at this time. Educate the patient/family about BHU procedures/visitation: Yes.  ; If necessary, describe findings:

## 2015-05-25 NOTE — ED Notes (Signed)
FSBS 128

## 2015-05-25 NOTE — Progress Notes (Signed)
Site visit by [facility] from Home, [phone], patient met with owner Vaughan Basta.  [facility].  Unable to take patient until Monday as the pharmacy is closed and cannot get patient's medication. Vaughan Basta will pick patient up after lunch on Monday.  Casimer Lanius. Latanya Presser, [place] Work Department Emergency Room [phone] 6:21 PM

## 2015-05-25 NOTE — ED Notes (Signed)
BEHAVIORAL HEALTH ROUNDING Patient sleeping: No. Patient alert and oriented: yes Behavior appropriate: Yes.  ; If no, describe:  Nutrition and fluids offered: No Toileting and hygiene offered: No Sitter present: no Law enforcement present: Yes  

## 2015-05-25 NOTE — ED Notes (Signed)
Dr. Weber Cooks rescinded IVC at 1700

## 2015-05-25 NOTE — ED Notes (Signed)
BEHAVIORAL HEALTH ROUNDING Patient sleeping: No. Patient alert and oriented: yes Behavior appropriate: Yes.  ; If no, describe:  Nutrition and fluids offered: No Toileting and hygiene offered: Yes  Sitter present: no, q 15 minute rounds Law enforcement present: Yes ODS  ENVIRONMENTAL ASSESSMENT Potentially harmful objects out of patient reach: Yes.   Personal belongings secured: Yes.   Patient dressed in hospital provided attire only: Yes.   Plastic bags out of patient reach: Yes.   Patient care equipment (cords, cables, call bells, lines, and drains) shortened, removed, or accounted for: Yes.   Equipment and supplies removed from bottom of stretcher: Yes.   Potentially toxic materials out of patient reach: Yes.   Sharps container removed or out of patient reach: Yes.

## 2015-05-25 NOTE — Clinical Social Work Note (Signed)
Clinical Social Work Assessment  Patient Details  Name: Kendra Nicholson MRN: [id number] Date of Birth: 05/18/1951  Date of referral:  05/25/15               Reason for consult:  Facility Placement, Housing Concerns/Homelessness                Permission sought to share information with:  Facility Sport and exercise psychologist, Family Supports, Other (Peer support) Permission granted to share information::  Yes, Verbal Permission Granted  Name::        Agency::   (facilities for placement)  Relationship::   (daughter Merlin and son-in-law Sherren Mocha  [phone])  Contact Information:     Housing/Transportation Living arrangements for the past 2 months:  Group Home, [place] of Information:  Patient, Outpatient Provider, Adult Children, Facility Patient Interpreter Needed:  None Criminal Activity/Legal Involvement Pertinent to Current Situation/Hospitalization:  No - Comment as needed Significant Relationships:  Adult Children, Mental Health Provider Lives with:  Facility Resident Do you feel safe going back to the place where you live?  No Need for family participation in patient care:  No (Coment)  Care giving concerns:  Patient's son-in-law  And peer support are both concerned that patient is unstable and should not be released from the hospital.   Social Worker assessment / plan:  Patient is 64 year old female.  Patient was recently discharged from the [facility] unit to a group home on 05/23/15.  patient returned to the ED expressing SI in order to get out of the house as patient stated the group home was "filthy".  New group home with [facility] will not allow patient to return and patient does not want to return.  Patient has limited support with her daughter and son-in-law that live in [place].  Also has an estranged daughter in [place].  Patient lived 2  years at [facility] but was given a notice during her visit at [facility] and unable to return.   Patient's son-in-law   [phone] informed CSW patient is unable to temp stay with them as they do not have room..  Per Sherren Mocha, patient is smart and knows the right words to say in order to get out of the hospital.  Informed CSW patient has "outburst when she does not get her way". Patient continues to loose housing due to her behavior.    CSW spoke to patient's peer support with [facility] [phone],  She meets with patient twice a week.  states she came to the hospital with patient yesterday.  Informed CSW patient is not at her baseline and in unstable.  When at baseline she is has clear goals and is not fixated on things.  For the past 5 weeks' patient has been very ridged in her thought process and has never spoken so much about SI.  Patient received CST services in the past with [facility].  Peer support believes patient needs an ACT team.  CSW discussed making ACT team referral and patient is open and in agreement with the referral as well as locating new housing.   Patient evaluated by psych MD,  ICV was discontinue with Disposition to discharge patient once there is a reasonable plan in place. CSW faxed referral to 30 ALF and family care homes for possible placement, and will contact [facility] to make a referral for an ACT team.  Employment status:  Disabled (Comment on whether or not currently receiving Disability) Insurance information:  Medicare, Medicaid [facility] [place] [facility]  Recommendations:  Not assessed at this time Information / Referral to community resources:  Other (Comment Required) (ALF, group home, ACT team)  Patient/Family's Response to care:  Marland Kitchen  Patient is in agreement with plan.   Patient/Family's Understanding of and Emotional Response to Diagnosis, Current Treatment, and Prognosis:  Patient understands she must take the first available bed.  Emotional Assessment Appearance:  Appears stated age Attitude/Demeanor/Rapport:  Complaining Affect (typically observed):  Adaptable Orientation:    Alcohol / Substance  use:   no Psych involvement (Current and /or in the community):  Outpatient Provider [facility]  / Peer support Rollene Fare [phone])  Discharge Needs  Concerns to be addressed:  Lack of Support, Homelessness, Mental Health Concerns Readmission within the last 30 days:  Yes Current discharge risk:  Psychiatric Illness, Chronically ill, Homeless, Lack of support system Barriers to Discharge:  Homeless with medical needs   Tery Sanfilippo 05/25/2015, 1:33 PM [facility] Lanius. Latanya Presser, MSW Clinical Social Work Department Emergency Room [phone] 1:39 PM

## 2015-05-25 NOTE — ED Notes (Signed)
SW at bedside with pt.

## 2015-05-26 DIAGNOSIS — F3130 Bipolar disorder, current episode depressed, mild or moderate severity, unspecified: Secondary | ICD-10-CM | POA: Diagnosis not present

## 2015-05-26 MED ORDER — METFORMIN HCL 500 MG PO TABS
ORAL_TABLET | ORAL | Status: AC
  Administered 2015-05-26: 1000 mg via ORAL
  Filled 2015-05-26: qty 2

## 2015-05-26 MED ORDER — METOPROLOL SUCCINATE ER 50 MG PO TB24
ORAL_TABLET | ORAL | Status: AC
  Administered 2015-05-26: 50 mg via ORAL
  Filled 2015-05-26: qty 1

## 2015-05-26 MED ORDER — AMLODIPINE BESYLATE 5 MG PO TABS
ORAL_TABLET | ORAL | Status: AC
  Administered 2015-05-26: 10 mg via ORAL
  Filled 2015-05-26: qty 2

## 2015-05-26 MED ORDER — TIZANIDINE HCL 4 MG PO TABS
ORAL_TABLET | ORAL | Status: AC
  Administered 2015-05-26: 2 mg via ORAL
  Filled 2015-05-26: qty 1

## 2015-05-26 MED ORDER — TUBERCULIN PPD 5 UNIT/0.1ML ID SOLN
5.0000 [IU] | Freq: Once | INTRADERMAL | Status: AC
  Administered 2015-05-26: 5 [IU] via INTRADERMAL
  Filled 2015-05-26 (×5): qty 0.1

## 2015-05-26 MED ORDER — QUETIAPINE FUMARATE 300 MG PO TABS
ORAL_TABLET | ORAL | Status: AC
  Administered 2015-05-26: 150 mg via ORAL
  Filled 2015-05-26: qty 1

## 2015-05-26 MED ORDER — GLIPIZIDE 10 MG PO TABS
ORAL_TABLET | ORAL | Status: AC
  Administered 2015-05-26: 5 mg via ORAL
  Filled 2015-05-26: qty 1

## 2015-05-26 MED ORDER — PRAVASTATIN SODIUM 20 MG PO TABS
ORAL_TABLET | ORAL | Status: AC
  Administered 2015-05-26: 20 mg via ORAL
  Filled 2015-05-26: qty 1

## 2015-05-26 NOTE — ED Provider Notes (Signed)
-----------------------------------------   8:10 AM on 05/26/2015 -----------------------------------------   Blood pressure 162/66, pulse 87, temperature 98.6 F (37 C), temperature source Oral, resp. rate 18, height 5\' 6"  (1.676 m), weight 186 lb (84.369 kg), SpO2 100 %.  The patient had no acute events since last update.  Calm and cooperative at this time.  Disposition is pending per Psychiatry/Behavioral Medicine team recommendations.   Patient accepted to a facility and will be picked up on Monday 9/26.  Loney Hering, MD 05/26/15 [phone]

## 2015-05-26 NOTE — ED Notes (Signed)
ED BHU [place] Is the patient under IVC or is there intent for IVC: No. Is the patient medically cleared: Yes.   Is there vacancy in the ED BHU: Yes.   Is the population mix appropriate for patient: Yes.   Is the patient awaiting placement in inpatient or outpatient setting: Yes.   Has the patient had a psychiatric consult: Yes.   Survey of unit performed for contraband, proper placement and condition of furniture, tampering with fixtures in bathroom, shower, and each patient room: Yes.  ; Findings: clear APPEARANCE/BEHAVIOR calm, cooperative and adequate rapport can be established NEURO ASSESSMENT Orientation: time, place and person Hallucinations: No.None noted (Hallucinations) Speech: Normal Gait: normal RESPIRATORY ASSESSMENT Normal expansion.  Clear to auscultation.  No rales, rhonchi, or wheezing. CARDIOVASCULAR ASSESSMENT regular rate and rhythm, S1, S2 normal, no murmur, click, rub or gallop GASTROINTESTINAL ASSESSMENT soft, nontender, BS WNL, no r/g EXTREMITIES normal strength, tone, and muscle mass PLAN OF CARE Provide calm/safe environment. Vital signs assessed twice daily. ED BHU Assessment once each 12-hour shift. Collaborate with intake RN daily or as condition indicates. Assure the ED provider has rounded once each shift. Provide and encourage hygiene. Provide redirection as needed. Assess for escalating behavior; address immediately and inform ED provider.  Assess family dynamic and appropriateness for visitation as needed: Yes.  ; If necessary, describe findings: pt allowed visitiation Educate the patient/family about BHU procedures/visitation: No.; If necessary, describe findings: no family available to educate

## 2015-05-26 NOTE — ED Notes (Signed)
BEHAVIORAL HEALTH ROUNDING Patient sleeping: Yes.   Patient alert and oriented: not applicable Behavior appropriate: Yes.  ; If no, describe:  Nutrition and fluids offered: Yes  Toileting and hygiene offered: Yes  Sitter present: yes Law enforcement present: Yes  

## 2015-05-26 NOTE — ED Notes (Signed)
Patient resting comfortably in room. No complaints or concerns voiced. No distress or abnormal behavior noted. Will continue to monitor with security cameras. Q 15 minute rounds continue.

## 2015-05-26 NOTE — ED Notes (Addendum)
BEHAVIORAL HEALTH ROUNDING Patient sleeping: Yes.   Patient alert and oriented: not applicable Behavior appropriate: Yes.  ; If no, describe:   Nutrition and fluids offered: No Toileting and hygiene offered: No Sitter present: no Law enforcement present: Yes  and ODS    

## 2015-05-26 NOTE — ED Notes (Signed)
Patient with labile mood, easily upset by perceived injustice " I'm a diabetic and there are cookies on my tray." She was told she did not have to eat cookies, she was getting her diabetic meds and her last glucose was within normal limits.  Maintained on 15 minute checks and observation by security camera for safety.

## 2015-05-26 NOTE — ED Notes (Signed)
BEHAVIORAL HEALTH ROUNDING Patient sleeping: Yes.   Patient alert and oriented: not applicable Behavior appropriate: Yes.  ; If no, describe:   Nutrition and fluids offered: No Toileting and hygiene offered: No Sitter present: no Law enforcement present: Yes  and ODS    

## 2015-05-26 NOTE — ED Notes (Signed)
BEHAVIORAL HEALTH ROUNDING Patient sleeping: No. Patient alert and oriented: yes Behavior appropriate: Yes.  ; If no, describe: friendly, cooperative Nutrition and fluids offered: Yes  Toileting and hygiene offered: Yes  Sitter present: yes Law enforcement present: Yes

## 2015-05-26 NOTE — ED Notes (Signed)

## 2015-05-26 NOTE — ED Notes (Signed)
Patient taking all medications as ordered. She reports good relief from previous dose of Tylenol for pain. She has been pleasant and cooperative with all nursing interventions. She is being maintained on 15 minute checks for safety and observation by security cameras.

## 2015-05-26 NOTE — ED Notes (Signed)
BEHAVIORAL HEALTH ROUNDING Patient sleeping: Yes.   Patient alert and oriented: not applicable Behavior appropriate: Yes.  ; If no, describe: Nutrition and fluids offered: Sleeping Toileting and hygiene offered: Sleeping Sitter present: not applicable Law enforcement present: Yes

## 2015-05-26 NOTE — ED Notes (Addendum)
BEHAVIORAL HEALTH ROUNDING Patient sleeping: No. Patient alert and oriented: yes Behavior appropriate: Yes.  ; If no, describe: talkitive and pleasent Nutrition and fluids offered: Yes  Toileting and hygiene offered: Yes  Sitter present: no Law enforcement present: Yes  and ODS  ED [place] Is the patient under IVC or is there intent for IVC: NO   Is the patient medically cleared: Yes.   Is there vacancy in the ED BHU: Yes.   Is the population mix appropriate for patient: Yes.   Is the patient awaiting placement in inpatient or outpatient setting: Yes.   Has the patient had a psychiatric consult: Yes.   Survey of unit performed for contraband, proper placement and condition of furniture, tampering with fixtures in bathroom, shower, and each patient room: Yes.  ; Findings: all clear APPEARANCE/BEHAVIOR calm, cooperative and adequate rapport can be established NEURO ASSESSMENT Orientation: place and person Hallucinations: No.None noted (Hallucinations) Speech: Normal Gait: normal RESPIRATORY ASSESSMENT Normal expansion.  Clear to auscultation.  No rales, rhonchi, or wheezing. CARDIOVASCULAR ASSESSMENT regular rate and rhythm, S1, S2 normal, no murmur, click, rub or gallop GASTROINTESTINAL ASSESSMENT soft, nontender, BS WNL, no r/g  EXTREMITIES normal strength, tone, and muscle mass PLAN OF CARE Provide calm/safe environment. Vital signs assessed twice daily. ED BHU Assessment once each 12-hour shift. Collaborate with intake RN daily or as condition indicates. Assure the ED provider has rounded once each shift. Provide and encourage hygiene. Provide redirection as needed. Assess for escalating behavior; address immediately and inform ED provider.  Assess family dynamic and appropriateness for visitation as needed: No.; If necessary, describe findings:   Educate the patient/family about BHU procedures/visitation: Yes.  ; If necessary, describe findings:     ENVIRONMENTAL ASSESSMENT Potentially harmful objects out of patient reach: Yes.   Personal belongings secured: Yes.   Patient dressed in hospital provided attire only: Yes.   Plastic bags out of patient reach: Yes.   Patient care equipment (cords, cables, call bells, lines, and drains) shortened, removed, or accounted for: Yes.   Equipment and supplies removed from bottom of stretcher: Yes.   Potentially toxic materials out of patient reach: Yes.   Sharps container removed or out of patient reach: Yes.

## 2015-05-26 NOTE — ED Notes (Signed)
PPD placed L inner forearm. Patient cooperative with procedure.

## 2015-05-26 NOTE — ED Notes (Signed)
Patient sleeping at this time. In no apparent distress. Maintained on 15 minute checks and observation by security camera for safety.

## 2015-05-27 DIAGNOSIS — F3130 Bipolar disorder, current episode depressed, mild or moderate severity, unspecified: Secondary | ICD-10-CM | POA: Diagnosis not present

## 2015-05-27 LAB — GLUCOSE, CAPILLARY: Glucose-Capillary: 129 mg/dL — ABNORMAL HIGH (ref 65–99)

## 2015-05-27 MED ORDER — TIZANIDINE HCL 4 MG PO TABS
ORAL_TABLET | ORAL | Status: AC
  Filled 2015-05-27: qty 1

## 2015-05-27 MED ORDER — QUETIAPINE FUMARATE 300 MG PO TABS
ORAL_TABLET | ORAL | Status: AC
  Filled 2015-05-27: qty 1

## 2015-05-27 MED ORDER — BUSPIRONE HCL 5 MG PO TABS
ORAL_TABLET | ORAL | Status: AC
  Filled 2015-05-27: qty 1

## 2015-05-27 MED ORDER — PRAVASTATIN SODIUM 20 MG PO TABS
ORAL_TABLET | ORAL | Status: AC
  Filled 2015-05-27: qty 1

## 2015-05-27 MED ORDER — BUSPIRONE HCL 10 MG PO TABS
ORAL_TABLET | ORAL | Status: AC
  Filled 2015-05-27: qty 1

## 2015-05-27 MED ORDER — ACETAMINOPHEN 500 MG PO TABS
ORAL_TABLET | ORAL | Status: AC
  Administered 2015-05-27: 1000 mg via ORAL
  Filled 2015-05-27: qty 2

## 2015-05-27 NOTE — ED Notes (Signed)
Group home administrator in with pt along with social worker for interview of pt for placement.  Discharge now on hold until tomorrow.

## 2015-05-27 NOTE — ED Notes (Addendum)
Report received from [place]. Pt. Alert and oriented in no distress denies HI, AVH and pain.  Pt. States she still has SI to stick something in outlet. Pt. Instructed to come to me with problems or concerns.Will continue to monitor for safety via security cameras and Q 15 minute checks.

## 2015-05-27 NOTE — ED Notes (Signed)
BEHAVIORAL HEALTH ROUNDING Patient sleeping: No. Patient alert and oriented: yes Behavior appropriate: Yes.  ;  Nutrition and fluids offered: Yes  Toileting and hygiene offered: Yes  Sitter present: no Law enforcement present: Yes   

## 2015-05-27 NOTE — ED Notes (Signed)
BEHAVIORAL HEALTH ROUNDING  Patient sleeping: Yes.  Patient alert and oriented: not applicable  Behavior appropriate: Yes. ; If no, describe:  Nutrition and fluids offered: Sleeping  Toileting and hygiene offered: Sleeping  Sitter present: not applicable  Law enforcement present: Yes

## 2015-05-27 NOTE — ED Notes (Signed)
Pt given breakfast tray. Vitals taken. 

## 2015-05-27 NOTE — ED Provider Notes (Signed)
Patient has been pending safe disposition, she has now been assigned to a group home. She is hemodynamically stable and has been cleared for discharge from a psychiatric standpoint now it is safe disposition has been realized. We'll discharge the patient into the care of the new group home staff.Close follow-up with her psychiatrist advice.  Delman Kitten, MD 05/27/15 [id number]

## 2015-05-27 NOTE — ED Notes (Signed)

## 2015-05-27 NOTE — ED Notes (Signed)
Pt waiting on possible discharge to group home.  Pt calm and cooperative.

## 2015-05-27 NOTE — ED Notes (Signed)
Pt. Noted in room. No complaints or concerns voiced. No distress or abnormal behavior noted. Will continue to monitor with security cameras. Q 15 minute rounds continue. 

## 2015-05-27 NOTE — ED Notes (Signed)
BEHAVIORAL HEALTH ROUNDING Patient sleeping: No. Patient alert and oriented: yes Behavior appropriate: Yes.  ; If no, describe:  Nutrition and fluids offered: Yes  Toileting and hygiene offered: Yes  Sitter present: yes Law enforcement present: Yes  

## 2015-05-27 NOTE — ED Notes (Signed)
Pt given supper tray.

## 2015-05-27 NOTE — Discharge Instructions (Signed)
Take your meds as prescribed.   See your psychiatrist.   Return to ER if you have thoughts of harming yourself or others, hallucinations.  Bipolar Disorder Bipolar disorder is a mental illness. The term bipolar disorder actually is used to describe a group of disorders that all share varying degrees of emotional highs and lows that can interfere with daily functioning, such as work, school, or relationships. Bipolar disorder also can lead to drug abuse, hospitalization, and suicide. The emotional highs of bipolar disorder are periods of elation or irritability and high energy. These highs can range from a mild form (hypomania) to a severe form (mania). People experiencing episodes of hypomania may appear energetic, excitable, and highly productive. People experiencing mania may behave impulsively or erratically. They often make poor decisions. They may have difficulty sleeping. The most severe episodes of mania can involve having very distorted beliefs or perceptions about the world and seeing or hearing things that are not real (psychotic delusions and hallucinations).  The emotional lows of bipolar disorder (depression) also can range from mild to severe. Severe episodes of bipolar depression can involve psychotic delusions and hallucinations. Sometimes people with bipolar disorder experience a state of mixed mood. Symptoms of hypomania or mania and depression are both present during this mixed-mood episode. SIGNS AND SYMPTOMS There are signs and symptoms of the episodes of hypomania and mania as well as the episodes of depression. The signs and symptoms of hypomania and mania are similar but vary in severity. They include:  Inflated self-esteem or feeling of increased self-confidence.  Decreased need for sleep.  Unusual talkativeness (rapid or pressured speech) or the feeling of a need to keep talking.  Sensation of racing thoughts or constant talking, with quick shifts between topics that may  or may not be related (flight of ideas).  Decreased ability to focus or concentrate.  Increased purposeful activity, such as work, studies, or social activity, or nonproductive activity, such as pacing, squirming and fidgeting, or finger and toe tapping.  Impulsive behavior and use of poor judgment, resulting in high-risk activities, such as having unprotected sex or spending excessive amounts of money. Signs and symptoms of depression include the following:   Feelings of sadness, hopelessness, or helplessness.  Frequent or uncontrollable episodes of crying.  Lack of feeling anything or caring about anything.  Difficulty sleeping or sleeping too much.  Inability to enjoy the things you used to enjoy.   Desire to be alone all the time.   Feelings of guilt or worthlessness.  Lack of energy or motivation.   Difficulty concentrating, remembering, or making decisions.  Change in appetite or weight beyond normal fluctuations.  Thoughts of death or the desire to harm yourself. DIAGNOSIS  Bipolar disorder is diagnosed through an assessment by your caregiver. Your caregiver will ask questions about your emotional episodes. There are two main types of bipolar disorder. People with type I bipolar disorder have manic episodes with or without depressive episodes. People with type II bipolar disorder have hypomanic episodes and major depressive episodes, which are more serious than mild depression. The type of bipolar disorder you have can make an important difference in how your illness is monitored and treated. Your caregiver may ask questions about your medical history and use of alcohol or drugs, including prescription medication. Certain medical conditions and substances also can cause emotional highs and lows that resemble bipolar disorder (secondary bipolar disorder).  TREATMENT  Bipolar disorder is a long-term illness. It is best controlled with continuous treatment  rather than  treatment only when symptoms occur. The following treatments can be prescribed for bipolar disorders:  Medication--Medication can be prescribed by a doctor that is an expert in treating mental disorders (psychiatrists). Medications called mood stabilizers are usually prescribed to help control the illness. Other medications are sometimes added if symptoms of mania, depression, or psychotic delusions and hallucinations occur despite the use of a mood stabilizer.  Talk therapy--Some forms of talk therapy are helpful in providing support, education, and guidance. A combination of medication and talk therapy is best for managing the disorder over time. A procedure in which electricity is applied to your brain through your scalp (electroconvulsive therapy) is used in cases of severe mania when medication and talk therapy do not work or work too slowly. Document Released: 11/23/2000 Document Revised: 12/12/2012 Document Reviewed: 09/12/2012 [facility] Patient Information 2015 [place], [place]. This information is not intended to replace advice given to you by your health care provider. Make sure you discuss any questions you have with your health care provider.

## 2015-05-27 NOTE — ED Notes (Signed)
BEHAVIORAL HEALTH ROUNDING Patient sleeping: Yes.   Patient alert and oriented: yes Behavior appropriate: Yes.  ;  Nutrition and fluids offered: Yes  Toileting and hygiene offered: Yes  Sitter present: no Law enforcement present: Yes  

## 2015-05-27 NOTE — ED Notes (Signed)
Pt. Noted sleeping in room. No complaints or concerns voiced. No distress or abnormal behavior noted. Will continue to monitor with security cameras. Q 15 minute rounds continue. 

## 2015-05-27 NOTE — ED Notes (Signed)
Pt resting in room at this time.

## 2015-05-27 NOTE — ED Provider Notes (Signed)
-----------------------------------------   6:41 AM on 05/27/2015 -----------------------------------------   Blood pressure 162/66, pulse 87, temperature 98.6 F (37 C), temperature source Oral, resp. rate 18, height 5\' 6"  (1.676 m), weight 186 lb (84.369 kg), SpO2 100 %.  The patient had no acute events since last update.  Calm and cooperative at this time.  Reportedly patient will go to a facility today.    Paulette Blanch, MD 05/27/15 [phone]

## 2015-05-27 NOTE — Progress Notes (Signed)
CSW followed up with [facility] owner from [facility] regarding dc today. Throughout the day Ms. Gay coordinated with CSW to make arrangements for Pt's needs at group home, even faxing Fl2 to pharmacy to make sure meds are ready. After coordinating a pick up time, Ms. [place] daughter Kennyth Lose comes to the ED and instead of picking up Pt, she states that she wants to meet Pt and decide for sure if they will take her. CSW discussed discrepancies with plan based on what has already been coordinated. CSW shared concerns over "jerking patient around" when she is looking for a stable living environment.  After meeting with Pt, group home stated that she wants to think about it and let CSW know in the morning. She had additional questions about Patient. CSW encouraged group home to call [facility] with any additional information, plus CST worker will remain in contact with Pt until she transitions to ACT services.   CSW explained setback to Pt who seemed to take it well given the fact that she was ready to go. RN updated as well. CSW will follow up with Pt in the morning.   Toma Copier, [place]

## 2015-05-28 DIAGNOSIS — F3130 Bipolar disorder, current episode depressed, mild or moderate severity, unspecified: Secondary | ICD-10-CM | POA: Diagnosis not present

## 2015-05-28 NOTE — ED Notes (Signed)
Pt. Noted sleeping in room. No complaints or concerns voiced. No distress or abnormal behavior noted. Will continue to monitor with security cameras. Q 15 minute rounds continue. 

## 2015-05-28 NOTE — ED Notes (Signed)
Patient resting comfortably in room. No complaints or concerns voiced. No distress or abnormal behavior noted. Will continue to monitor with security cameras. Q 15 minute rounds continue.

## 2015-05-28 NOTE — ED Notes (Signed)
Patient took all scheduled evening medications.  Maintained on all safety checks.

## 2015-05-28 NOTE — ED Provider Notes (Signed)
-----------------------------------------   7:09 AM on 05/28/2015 -----------------------------------------   Blood pressure 163/78, pulse 82, temperature 98.3 F (36.8 C), temperature source Oral, resp. rate 14, height 5\' 6"  (1.676 m), weight 186 lb (84.369 kg), SpO2 97 %.  The patient had no acute events since last update.  Calm and resting at this time.  Disposition is pending return of plan from pending group home. Clinical social work following.     Delman Kitten, MD 05/28/15 [phone]

## 2015-05-28 NOTE — ED Notes (Signed)
Received word that patient was accepted at group home. Son brought some personal belongings and walker for patient to take with her after discharge from ED - they were locked up until actual discharge.  Maintained on 15 minute checks and observation by security camera for safety.

## 2015-05-28 NOTE — Progress Notes (Signed)
CSW was notified by [facility] that they will take patient today to new home. CSW followed up with Sherren Mocha, Pt's son-in-law, and they will be bringing Pt's walker to ED to go with Pt to facility. Pt's family asked about CPAP that was dropped off in ED early Sept. CSW will follow up with leadership regarding cpap which was new for Pt.    CSW left messages with Vaughan Basta, [facility] owner, who will be providing Pt transportation to new group home today.   ED RN updated with plan. Pt updated by RN.   Toma Copier, [place]

## 2015-05-28 NOTE — ED Notes (Signed)
Maintained on 15 minute checks and observation by security camera for safety. 

## 2015-05-28 NOTE — ED Notes (Addendum)
PPD read by RN. No evidence of induration - zero cm.

## 2015-05-28 NOTE — ED Notes (Signed)

## 2015-05-28 NOTE — ED Notes (Signed)
Pt sitting in room watching tv, calm and cooperative at this time.  Pt awaiting discharge to group home, awaiting arrangement of transportation.

## 2015-05-28 NOTE — BHH Counselor (Signed)
Writer spoke to [employer], Baxter Flattery, about placement for Pt.  LCSW is checking with other homes today for Pt.  Kendra Nicholson, LPCA Therapeutic Triage Specialist

## 2015-05-28 NOTE — ED Notes (Signed)
Pt. Noted leeping  in room. No complaints or concerns voiced. No distress or abnormal behavior noted. Will continue to monitor with security cameras. Q 15 minute rounds continue.

## 2015-05-28 NOTE — ED Notes (Signed)
Meal given

## 2015-05-28 NOTE — ED Notes (Signed)
ED BHU PLACEMENT JUSTIFICATION Is the patient under IVC or is there intent for IVC: No. Is the patient medically cleared: Yes.   Is there vacancy in the ED BHU: Yes.   Is the population mix appropriate for patient: Yes.   Is the patient awaiting placement in inpatient or outpatient setting: Yes.   Has the patient had a psychiatric consult: Yes.   Survey of unit performed for contraband, proper placement and condition of furniture, tampering with fixtures in bathroom, shower, and each patient room: Yes.  ; Findings:  APPEARANCE/BEHAVIOR calm, cooperative and adequate rapport can be established NEURO ASSESSMENT Orientation: time, place and person Hallucinations: No.None noted (Hallucinations) Speech: Normal Gait: normal RESPIRATORY ASSESSMENT Normal expansion.  Clear to auscultation.  No rales, rhonchi, or wheezing. CARDIOVASCULAR ASSESSMENT regular rate and rhythm, S1, S2 normal, no murmur, click, rub or gallop GASTROINTESTINAL ASSESSMENT soft, nontender, BS WNL, no r/g EXTREMITIES normal strength, tone, and muscle mass PLAN OF CARE Provide calm/safe environment. Vital signs assessed twice daily. ED BHU Assessment once each 12-hour shift. Collaborate with intake RN daily or as condition indicates. Assure the ED Parag Dorton has rounded once each shift. Provide and encourage hygiene. Provide redirection as needed. Assess for escalating behavior; address immediately and inform ED Anthem Frazer.  Assess family dynamic and appropriateness for visitation as needed: Yes.  ; If necessary, describe findings:  Educate the patient/family about BHU procedures/visitation: Yes.  ; If necessary, describe findings:  

## 2015-05-28 NOTE — ED Notes (Signed)
Pt eating snack tray

## 2015-05-28 NOTE — ED Notes (Signed)
BEHAVIORAL HEALTH ROUNDING Patient sleeping: yes Patient alert and oriented: YES Behavior appropriate: YES Describe behavior: No inappropriate or unacceptable behaviors noted at this time.  Nutrition and fluids offered: YES Toileting and hygiene offered: YES Sitter present: Industrial/product designer rounding every 15 minutes on patient to ensure safety.  Law enforcement present: Programmer, applications agency: [place] ([place])

## 2015-05-28 NOTE — ED Notes (Addendum)
Patient aware she will go to group home later today. Maintained on 15 minute checks and observation by security camera for safety.

## 2015-05-28 NOTE — ED Notes (Signed)

## 2015-05-28 NOTE — ED Notes (Signed)
Report received from [facility] RN. Patient care assumed. Patient/RN introduction complete. Will continue to monitor.

## 2015-05-28 NOTE — ED Notes (Signed)

## 2015-05-28 NOTE — ED Notes (Signed)
Patient says she needs to go to [facility] for appointment today. Reminded her that she was in the ED and was waiting for placement.  Maintained on 15 minute checks and observation by security camera for safety.

## 2015-05-28 NOTE — ED Notes (Signed)
ENVIRONMENTAL ASSESSMENT Potentially harmful objects out of patient reach: Yes.   Personal belongings secured: Yes.   Patient dressed in hospital provided attire only: Yes.   Plastic bags out of patient reach: Yes.   Patient care equipment (cords, cables, call bells, lines, and drains) shortened, removed, or accounted for: Yes.   Equipment and supplies removed from bottom of stretcher: Yes.   Potentially toxic materials out of patient reach: Yes.   Sharps container removed or out of patient reach: Yes.     Patient asleep on initial rounds. Maintained on 15 minute checks and observation by security camera for safety.

## 2015-05-29 DIAGNOSIS — F3130 Bipolar disorder, current episode depressed, mild or moderate severity, unspecified: Secondary | ICD-10-CM | POA: Diagnosis not present

## 2015-05-29 LAB — GLUCOSE, CAPILLARY: Glucose-Capillary: 132 mg/dL — ABNORMAL HIGH (ref 65–99)

## 2015-05-29 MED ORDER — ACETAMINOPHEN 325 MG PO TABS
ORAL_TABLET | ORAL | Status: AC
  Administered 2015-05-29: 650 mg via ORAL
  Filled 2015-05-29: qty 2

## 2015-05-29 MED ORDER — ACETAMINOPHEN 325 MG PO TABS
650.0000 mg | ORAL_TABLET | Freq: Once | ORAL | Status: AC
  Administered 2015-05-29: 650 mg via ORAL
  Filled 2015-05-29: qty 2

## 2015-05-29 NOTE — Progress Notes (Signed)
Disposition Note: Patient will be picked up at noon. Group home provider [phone] requires walker, and all documentation FL2 and TB test results.

## 2015-05-29 NOTE — ED Notes (Signed)

## 2015-05-29 NOTE — ED Notes (Signed)
Patient resting comfortably in room. No complaints or concerns voiced. No distress or abnormal behavior noted. Will continue to monitor with security cameras. Q 15 minute rounds continue.

## 2015-05-29 NOTE — ED Notes (Addendum)
BEHAVIORAL HEALTH ROUNDING Patient sleeping: No. Patient alert and oriented: yes Behavior appropriate: Yes.  ; If no, describe:  Nutrition and fluids offered: Yes  Toileting and hygiene offered: Yes  Sitter present: yes Law enforcement present: Yes  

## 2015-05-29 NOTE — ED Notes (Signed)
Pt pending d/c at noon to group home per CSW.

## 2015-05-29 NOTE — ED Notes (Signed)
Pt co arthritic pain, requesting tylenol.

## 2015-05-29 NOTE — ED Notes (Addendum)
Patient resting comfortably in room. No complaints or concerns voiced. No distress or abnormal behavior noted. Will continue to monitor with security cameras. Q 15 minute rounds continue. 

## 2015-05-29 NOTE — ED Notes (Addendum)
Pt resting quietly in room, will continue to monitor.  BEHAVIORAL HEALTH ROUNDING Patient sleeping: No. Patient alert and oriented: yes Behavior appropriate: Yes.  ; If no, describe:  Nutrition and fluids offered: Yes  Toileting and hygiene offered: Yes  Sitter present: yes Law enforcement present: Yes

## 2015-05-29 NOTE — BHH Counselor (Signed)
Received phone call from  [facility] [phone]) stating she had been informed that Pt would be at group home. Caller stated that she was presently at group home to see Pt and Pt was not there. Writer directed Pt to social work [phone]) for further assistance.

## 2015-05-29 NOTE — ED Notes (Signed)
Pt resting quietly in room, will continue to monitor.  BEHAVIORAL HEALTH ROUNDING Patient sleeping: Yes.   Patient alert and oriented: not applicable Behavior appropriate: Yes.  ; If no, describe:  Nutrition and fluids offered: Yes  Toileting and hygiene offered: Yes  Sitter present: yes Law enforcement present: Yes  ENVIRONMENTAL ASSESSMENT Potentially harmful objects out of patient reach: Yes.   Personal belongings secured: Yes.   Patient dressed in hospital provided attire only: Yes.   Plastic bags out of patient reach: Yes.   Patient care equipment (cords, cables, call bells, lines, and drains) shortened, removed, or accounted for: Yes.   Equipment and supplies removed from bottom of stretcher: Yes.   Potentially toxic materials out of patient reach: Yes.   Sharps container removed or out of patient reach: Yes.

## 2015-05-29 NOTE — ED Notes (Signed)
Pt resting quietly with eyes closed, even and nonlabored respirations noted. Will continue to monitor with security cameras. Q 15 minute rounds continued. Verbal report received on patient.

## 2015-05-29 NOTE — ED Notes (Signed)
Pt refusing shower at this time, states "I'll take one when I get where I'm going".  ENVIRONMENTAL ASSESSMENT Potentially harmful objects out of patient reach: Yes.   Personal belongings secured: Yes.   Patient dressed in hospital provided attire only: Yes.   Plastic bags out of patient reach: Yes.   Patient care equipment (cords, cables, call bells, lines, and drains) shortened, removed, or accounted for: Yes.   Equipment and supplies removed from bottom of stretcher: Yes.   Potentially toxic materials out of patient reach: Yes.   Sharps container removed or out of patient reach: Yes.    BEHAVIORAL HEALTH ROUNDING Patient sleeping: No. Patient alert and oriented: yes Behavior appropriate: Yes.  ; If no, describe:  Nutrition and fluids offered: Yes  Toileting and hygiene offered: Yes  Sitter present: yes Law enforcement present: Yes

## 2015-05-29 NOTE — ED Provider Notes (Signed)
-----------------------------------------   6:05 AM on 05/29/2015 -----------------------------------------   Blood pressure 144/109, pulse 82, temperature 98.6 F (37 C), temperature source Oral, resp. rate 16, height 5\' 6"  (1.676 m), weight 186 lb (84.369 kg), SpO2 97 %.  The patient had no acute events since last update.  Calm and cooperative at this time.  Disposition is pending per clinical social work recommendations.   Paulette Blanch, MD 05/29/15 [phone]

## 2015-05-29 NOTE — ED Notes (Signed)
Pt resting quietly in room, watching tv. Will continue to monitor. q 15 minute rounds continue.

## 2015-05-29 NOTE — Progress Notes (Deleted)
LCSW called patients guardian and left a message for the 2nd time. LCSW awaiting a call back.  LCSW called group home provider [phone] She stated because its later in the day she does not have a coverage worker to replace the staff member. She has agreed to pick up patient at 11am tomorrow.  LCSW has not heard back from guardian to inform her of patients up coming discharge.  LCSW met with patient and reviewed safety concerns and conduct issues which could be problematic. LCSW reviewed some simple ABC- to reduce anger in positive ways ( walking, journaling and colouring) 

## 2015-05-30 NOTE — Progress Notes (Deleted)
LCSW requested care finder pro information to complete FL2 for patient

## 2015-06-11 DIAGNOSIS — G4733 Obstructive sleep apnea (adult) (pediatric): Secondary | ICD-10-CM | POA: Insufficient documentation

## 2015-07-18 ENCOUNTER — Emergency Department (HOSPITAL_COMMUNITY)
Admission: EM | Admit: 2015-07-18 | Discharge: 2015-07-19 | Disposition: A | Discharge status: Home / Self Care | Payer: Medicare Other | Attending: Emergency Medicine | Admitting: Emergency Medicine

## 2015-07-18 ENCOUNTER — Encounter (HOSPITAL_COMMUNITY): Payer: Self-pay | Admitting: Emergency Medicine

## 2015-07-18 DIAGNOSIS — F319 Bipolar disorder, unspecified: Secondary | ICD-10-CM | POA: Diagnosis not present

## 2015-07-18 DIAGNOSIS — R4182 Altered mental status, unspecified: Secondary | ICD-10-CM | POA: Diagnosis present

## 2015-07-18 DIAGNOSIS — Z79899 Other long term (current) drug therapy: Secondary | ICD-10-CM | POA: Insufficient documentation

## 2015-07-18 DIAGNOSIS — I10 Essential (primary) hypertension: Secondary | ICD-10-CM | POA: Diagnosis not present

## 2015-07-18 DIAGNOSIS — Z7951 Long term (current) use of inhaled steroids: Secondary | ICD-10-CM | POA: Diagnosis not present

## 2015-07-18 DIAGNOSIS — E119 Type 2 diabetes mellitus without complications: Secondary | ICD-10-CM | POA: Diagnosis not present

## 2015-07-18 DIAGNOSIS — R4189 Other symptoms and signs involving cognitive functions and awareness: Secondary | ICD-10-CM

## 2015-07-18 DIAGNOSIS — F312 Bipolar disorder, current episode manic severe with psychotic features: Secondary | ICD-10-CM | POA: Diagnosis present

## 2015-07-18 DIAGNOSIS — G20 Parkinson's disease: Secondary | ICD-10-CM | POA: Insufficient documentation

## 2015-07-18 DIAGNOSIS — F419 Anxiety disorder, unspecified: Secondary | ICD-10-CM | POA: Diagnosis not present

## 2015-07-18 DIAGNOSIS — Z793 Long term (current) use of hormonal contraceptives: Secondary | ICD-10-CM | POA: Insufficient documentation

## 2015-07-18 DIAGNOSIS — R4689 Other symptoms and signs involving appearance and behavior: Secondary | ICD-10-CM

## 2015-07-18 DIAGNOSIS — F919 Conduct disorder, unspecified: Secondary | ICD-10-CM | POA: Insufficient documentation

## 2015-07-18 HISTORY — DX: Parkinson's disease: G20

## 2015-07-18 HISTORY — DX: Parkinson's disease without dyskinesia, without mention of fluctuations: G20.A1

## 2015-07-18 HISTORY — DX: Sleep apnea, unspecified: G47.30

## 2015-07-18 LAB — CBC WITH DIFFERENTIAL/PLATELET
Basophils Absolute: 0 10*3/uL (ref 0.0–0.1)
Basophils Relative: 0 %
Eosinophils Absolute: 0 10*3/uL (ref 0.0–0.7)
Eosinophils Relative: 1 %
HCT: 38.3 % (ref 36.0–46.0)
Hemoglobin: 13.2 g/dL (ref 12.0–15.0)
Lymphocytes Relative: 20 %
Lymphs Abs: 1.4 10*3/uL (ref 0.7–4.0)
MCH: 32 pg (ref 26.0–34.0)
MCHC: 34.5 g/dL (ref 30.0–36.0)
MCV: 92.7 fL (ref 78.0–100.0)
Monocytes Absolute: 0.8 10*3/uL (ref 0.1–1.0)
Monocytes Relative: 12 %
Neutro Abs: 4.5 10*3/uL (ref 1.7–7.7)
Neutrophils Relative %: 67 %
Platelets: 281 10*3/uL (ref 150–400)
RBC: 4.13 MIL/uL (ref 3.87–5.11)
RDW: 11.7 % (ref 11.5–15.5)
WBC: 6.8 10*3/uL (ref 4.0–10.5)

## 2015-07-18 LAB — COMPREHENSIVE METABOLIC PANEL
ALT: 10 U/L — ABNORMAL LOW (ref 14–54)
AST: 26 U/L (ref 15–41)
Albumin: 4.8 g/dL (ref 3.5–5.0)
Alkaline Phosphatase: 31 U/L — ABNORMAL LOW (ref 38–126)
Anion gap: 11 (ref 5–15)
BUN: 10 mg/dL (ref 6–20)
CO2: 24 mmol/L (ref 22–32)
Calcium: 9.8 mg/dL (ref 8.9–10.3)
Chloride: 106 mmol/L (ref 101–111)
Creatinine, Ser: 0.75 mg/dL (ref 0.44–1.00)
GFR calc Af Amer: >60 mL/min (ref >60)
GFR calc non Af Amer: >60 mL/min (ref >60)
Glucose, Bld: 106 mg/dL — ABNORMAL HIGH (ref 65–99)
Potassium: 3.6 mmol/L (ref 3.5–5.1)
Sodium: 141 mmol/L (ref 135–145)
Total Bilirubin: 0.7 mg/dL (ref 0.3–1.2)
Total Protein: 7.9 g/dL (ref 6.5–8.1)

## 2015-07-18 LAB — RAPID URINE DRUG SCREEN, HOSP PERFORMED
Amphetamines: NOT DETECTED
Barbiturates: NOT DETECTED
Benzodiazepines: NOT DETECTED
Cocaine: NOT DETECTED
Opiates: NOT DETECTED
Tetrahydrocannabinol: NOT DETECTED

## 2015-07-18 LAB — ETHANOL: Alcohol, Ethyl (B): <5 mg/dL (ref <5)

## 2015-07-18 MED ORDER — BUSPIRONE HCL 15 MG PO TABS
15.0000 mg | ORAL_TABLET | Freq: Two times a day (BID) | ORAL | Status: DC
  Administered 2015-07-18 – 2015-07-19 (×2): 15 mg via ORAL
  Filled 2015-07-18 (×8): qty 1

## 2015-07-18 MED ORDER — FLUTICASONE PROPIONATE 50 MCG/ACT NA SUSP
2.0000 | Freq: Every day | NASAL | Status: DC
  Administered 2015-07-19: 2 via NASAL
  Filled 2015-07-18: qty 16

## 2015-07-18 MED ORDER — AMLODIPINE BESYLATE 5 MG PO TABS
10.0000 mg | ORAL_TABLET | Freq: Every day | ORAL | Status: DC
  Administered 2015-07-19: 10 mg via ORAL
  Filled 2015-07-18 (×2): qty 2

## 2015-07-18 MED ORDER — METOPROLOL SUCCINATE ER 50 MG PO TB24
50.0000 mg | ORAL_TABLET | Freq: Every day | ORAL | Status: DC
  Administered 2015-07-19: 50 mg via ORAL
  Filled 2015-07-18 (×4): qty 1

## 2015-07-18 MED ORDER — ACETAMINOPHEN 325 MG PO TABS
650.0000 mg | ORAL_TABLET | Freq: Four times a day (QID) | ORAL | Status: DC | PRN
Start: 2015-07-18 — End: 2015-07-19
  Administered 2015-07-19 (×2): 650 mg via ORAL
  Filled 2015-07-18 (×2): qty 2

## 2015-07-18 MED ORDER — EPINEPHRINE 0.3 MG/0.3ML IJ SOAJ: 0.3000 mg | Freq: Once | INTRAMUSCULAR | Status: DC | PRN

## 2015-07-18 MED ORDER — CARBIDOPA-LEVODOPA 25-100 MG PO TABS
1.0000 | ORAL_TABLET | Freq: Three times a day (TID) | ORAL | Status: DC
  Administered 2015-07-18 – 2015-07-19 (×2): 1 via ORAL
  Filled 2015-07-18 (×11): qty 1

## 2015-07-18 MED ORDER — ESTRADIOL 1 MG PO TABS
1.0000 mg | ORAL_TABLET | Freq: Every day | ORAL | Status: DC
  Administered 2015-07-19: 1 mg via ORAL
  Filled 2015-07-18 (×4): qty 1

## 2015-07-18 MED ORDER — QUETIAPINE FUMARATE 100 MG PO TABS
150.0000 mg | ORAL_TABLET | Freq: Every day | ORAL | Status: DC
  Administered 2015-07-18: 150 mg via ORAL
  Filled 2015-07-18: qty 2

## 2015-07-18 MED ORDER — TIZANIDINE HCL 4 MG PO TABS
ORAL_TABLET | ORAL | Status: AC
  Filled 2015-07-18: qty 1

## 2015-07-18 MED ORDER — CARBIDOPA-LEVODOPA 25-100 MG PO TABS
ORAL_TABLET | ORAL | Status: AC
  Filled 2015-07-18: qty 1

## 2015-07-18 MED ORDER — TIZANIDINE HCL 4 MG PO TABS
2.0000 mg | ORAL_TABLET | Freq: Two times a day (BID) | ORAL | Status: DC
  Administered 2015-07-18 – 2015-07-19 (×2): 2 mg via ORAL
  Filled 2015-07-18 (×8): qty 1

## 2015-07-18 MED ORDER — ADULT MULTIVITAMIN W/MINERALS CH
1.0000 | ORAL_TABLET | Freq: Every day | ORAL | Status: DC
  Administered 2015-07-19: 1 via ORAL
  Filled 2015-07-18: qty 1

## 2015-07-18 MED ORDER — CITALOPRAM HYDROBROMIDE 20 MG PO TABS
40.0000 mg | ORAL_TABLET | Freq: Every day | ORAL | Status: DC
Start: 2015-07-19 — End: 2015-07-19
  Administered 2015-07-19: 40 mg via ORAL
  Filled 2015-07-18 (×4): qty 2

## 2015-07-18 MED ORDER — METFORMIN HCL 500 MG PO TABS
1000.0000 mg | ORAL_TABLET | Freq: Two times a day (BID) | ORAL | Status: DC
  Administered 2015-07-19: 1000 mg via ORAL
  Filled 2015-07-18: qty 2

## 2015-07-18 MED ORDER — PRAVASTATIN SODIUM 10 MG PO TABS
20.0000 mg | ORAL_TABLET | Freq: Every day | ORAL | Status: DC
  Administered 2015-07-19: 20 mg via ORAL
  Filled 2015-07-18 (×4): qty 1

## 2015-07-18 MED ORDER — BUSPIRONE HCL 5 MG PO TABS
ORAL_TABLET | ORAL | Status: AC
  Filled 2015-07-18: qty 3

## 2015-07-18 MED ORDER — GLIPIZIDE 5 MG PO TABS
5.0000 mg | ORAL_TABLET | Freq: Every day | ORAL | Status: DC
  Administered 2015-07-19: 5 mg via ORAL
  Filled 2015-07-18 (×4): qty 1

## 2015-07-18 NOTE — ED Notes (Signed)
Observe in ED overnight and TTS with NP in am.

## 2015-07-18 NOTE — Progress Notes (Signed)
Writer contacted [facility] group home and was advised by Corbin Ade that patient has been discharged from their facility.  APED RN Joycelyn Schmid informed.  CSW will continue to seek alternative placement/group home.  Verlon Setting, [facility] Disposition staff 07/18/2015 5:56 PM

## 2015-07-18 NOTE — ED Notes (Signed)
Patient belongings, 1 black purse, 1 t shirt, 1 pair of brown pants, 1 pair of white socks, 1 pair of shoes. Locked in ED locker. Patient advised of belongings policy.

## 2015-07-18 NOTE — ED Notes (Signed)
Patient given water per request.

## 2015-07-18 NOTE — ED Notes (Signed)
Per EMS- Staff at [facility] group home/assisted living report pt has become manic and increasingly violent x 2 days. [id number]. Pt denies si/hi. Pt states she is angry at a lady that lives at the home because she is harassing her and the staff will not help her.

## 2015-07-18 NOTE — ED Notes (Signed)
Kendra Nicholson at [facility] called to let Kendra O. Know that she had spoken with the Education officer, museum, who had spoken to [facility](Patients Residence).  They report to the Social Worker that they had discharged the Patient from their facility.  BH will be looking into this.  Nurse informed.

## 2015-07-18 NOTE — ED Provider Notes (Signed)
CSN: [id number]     Arrival date & time 07/18/15  1527 History   First MD Initiated Contact with Patient 07/18/15 1531     Chief Complaint  Patient presents with  . Medical Clearance     (Consider location/radiation/quality/duration/timing/severity/associated sxs/prior Treatment) Patient is a 64 y.o. female presenting with altered mental status. The history is provided by the patient (Patient has a history of bipolar. The patient was acting out with staff at care center).  Altered Mental Status Presenting symptoms: behavior changes   Severity:  Moderate Most recent episode:  Today Episode history:  Multiple Timing:  Constant Progression:  Waxing and waning Chronicity:  Recurrent Context: not alcohol use   Associated symptoms: agitation   Associated symptoms: no abdominal pain, no hallucinations, no headaches, no rash and no seizures     Past Medical History  Diagnosis Date  . Anxiety   . Depression   . Hypertension   . Diabetes mellitus without complication ([place])   . Parkinson's disease ([place])   . Bipolar disorder ([place])   . Sleep apnea    History reviewed. No pertinent past surgical history. No family history on file. Social History  Substance Use Topics  . Smoking status: Never Smoker   . Smokeless tobacco: None  . Alcohol Use: No   OB History    No data available     Review of Systems  Constitutional: Negative for appetite change and fatigue.  HENT: Negative for congestion, ear discharge and sinus pressure.   Eyes: Negative for discharge.  Respiratory: Negative for cough.   Cardiovascular: Negative for chest pain.  Gastrointestinal: Negative for abdominal pain and diarrhea.  Genitourinary: Negative for frequency and hematuria.  Musculoskeletal: Negative for back pain.  Skin: Negative for rash.  Neurological: Negative for seizures and headaches.  Psychiatric/Behavioral: Positive for agitation. Negative for hallucinations.      Allergies  Bee venom;  Demerol; Ivp dye; Shellfish allergy; Adhesive; and Iodine  Home Medications   Prior to Admission medications   Medication Sig Start Date End Date Taking? Authorizing Provider  acetaminophen (TYLENOL) 325 MG tablet Take 2 tablets (650 mg total) by mouth every 6 (six) hours as needed for mild pain. 05/22/15  Yes Jolanta B Pucilowska, MD  amLODipine (NORVASC) 10 MG tablet Take 1 tablet (10 mg total) by mouth daily. 05/22/15  Yes Jolanta B Pucilowska, MD  busPIRone (BUSPAR) 15 MG tablet Take 1 tablet (15 mg total) by mouth 2 (two) times daily. 05/22/15  Yes Jolanta B Pucilowska, MD  carbidopa-levodopa (SINEMET IR) 25-100 MG per tablet Take 1 tablet by mouth 3 (three) times daily. 05/22/15  Yes Jolanta B Pucilowska, MD  citalopram (CELEXA) 40 MG tablet Take 1 tablet (40 mg total) by mouth daily. 05/22/15  Yes Jolanta B Pucilowska, MD  EPINEPHrine (EPIPEN 2-PAK) 0.3 mg/0.3 mL IJ SOAJ injection Inject 0.3 mLs (0.3 mg total) into the muscle once as needed (for severe allergic reaction). 05/22/15  Yes Jolanta B Pucilowska, MD  estradiol (ESTRACE) 1 MG tablet Take 1 tablet (1 mg total) by mouth daily. Pt does not take on Wednesday and Saturday. 05/22/15  Yes Jolanta B Pucilowska, MD  fluticasone (FLONASE) 50 MCG/ACT nasal spray Place 2 sprays into both nostrils daily. 05/22/15  Yes Jolanta B Pucilowska, MD  glipiZIDE (GLUCOTROL) 5 MG tablet Take 1 tablet (5 mg total) by mouth daily. 05/22/15  Yes Jolanta B Pucilowska, MD  metFORMIN (GLUCOPHAGE) 1000 MG tablet Take 1 tablet (1,000 mg total) by mouth 2 (two) times  daily with a meal. 05/22/15  Yes Jolanta B Pucilowska, MD  metoprolol succinate (TOPROL-XL) 50 MG 24 hr tablet Take 1 tablet (50 mg total) by mouth daily. 05/22/15  Yes Clovis Fredrickson, MD  Multiple Vitamin (THEREMS) TABS Take 1 tablet by mouth daily. 05/22/15  Yes Jolanta B Pucilowska, MD  pravastatin (PRAVACHOL) 20 MG tablet Take 1 tablet (20 mg total) by mouth at bedtime. 05/22/15  Yes Jolanta B  Pucilowska, MD  QUEtiapine (SEROQUEL) 50 MG tablet Take 3 tablets (150 mg total) by mouth at bedtime. 05/22/15  Yes Jolanta B Pucilowska, MD  tiZANidine (ZANAFLEX) 2 MG tablet Take 1 tablet (2 mg total) by mouth 2 (two) times daily. 05/22/15  Yes Jolanta B Pucilowska, MD   BP 141/77 mmHg  Pulse 84  Temp(Src) 98.6 F (37 C) (Oral)  Resp 20  Ht 5\' 6"  (1.676 m)  Wt 177 lb (80.287 kg)  BMI 28.58 kg/m2  SpO2 99% Physical Exam  Constitutional: She is oriented to person, place, and time. She appears well-developed.  HENT:  Head: Normocephalic.  Eyes: Conjunctivae and EOM are normal. No scleral icterus.  Neck: Neck supple. No thyromegaly present.  Cardiovascular: Normal rate and regular rhythm.  Exam reveals no gallop and no friction rub.   No murmur heard. Pulmonary/Chest: No stridor. She has no wheezes. She has no rales. She exhibits no tenderness.  Abdominal: She exhibits no distension. There is no tenderness. There is no rebound.  Musculoskeletal: Normal range of motion. She exhibits no edema.  Lymphadenopathy:    She has no cervical adenopathy.  Neurological: She is oriented to person, place, and time. She exhibits normal muscle tone. Coordination normal.  Skin: No rash noted. No erythema.  Psychiatric: She has a normal mood and affect.  Patient anxious not suicidal or homicidal    ED Course  Procedures (including critical care time) Labs Review Labs Reviewed  COMPREHENSIVE METABOLIC PANEL - Abnormal; Notable for the following:    Glucose, Bld 106 (*)    ALT 10 (*)    Alkaline Phosphatase 31 (*)    All other components within normal limits  CBC WITH DIFFERENTIAL/PLATELET  ETHANOL  URINE RAPID DRUG SCREEN, HOSP PERFORMED    Imaging Review No results found. I have personally reviewed and evaluated these images and lab results as part of my medical decision-making.   EKG Interpretation None      MDM   Final diagnoses:  None    Bipolar patient. Psych will  reevaluate in the a.m.    Milton Ferguson, MD 07/18/15 2214
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
## 2015-07-18 NOTE — ED Notes (Signed)
Monitor placed in the room for TTS consult. 

## 2015-07-19 ENCOUNTER — Emergency Department
Admission: EM | Admit: 2015-07-19 | Discharge: 2015-07-20 | Disposition: A | Discharge status: Home / Self Care | Payer: Medicare Other | Attending: Emergency Medicine | Admitting: Emergency Medicine

## 2015-07-19 ENCOUNTER — Encounter: Payer: Self-pay | Admitting: Emergency Medicine

## 2015-07-19 DIAGNOSIS — Z79899 Other long term (current) drug therapy: Secondary | ICD-10-CM | POA: Diagnosis not present

## 2015-07-19 DIAGNOSIS — I10 Essential (primary) hypertension: Secondary | ICD-10-CM | POA: Diagnosis not present

## 2015-07-19 DIAGNOSIS — Z008 Encounter for other general examination: Secondary | ICD-10-CM | POA: Diagnosis present

## 2015-07-19 DIAGNOSIS — F919 Conduct disorder, unspecified: Secondary | ICD-10-CM | POA: Diagnosis not present

## 2015-07-19 DIAGNOSIS — F418 Other specified anxiety disorders: Secondary | ICD-10-CM | POA: Insufficient documentation

## 2015-07-19 DIAGNOSIS — E119 Type 2 diabetes mellitus without complications: Secondary | ICD-10-CM | POA: Insufficient documentation

## 2015-07-19 DIAGNOSIS — Z87891 Personal history of nicotine dependence: Secondary | ICD-10-CM | POA: Insufficient documentation

## 2015-07-19 DIAGNOSIS — F911 Conduct disorder, childhood-onset type: Secondary | ICD-10-CM | POA: Insufficient documentation

## 2015-07-19 DIAGNOSIS — Z91040 Latex allergy status: Secondary | ICD-10-CM | POA: Insufficient documentation

## 2015-07-19 DIAGNOSIS — F312 Bipolar disorder, current episode manic severe with psychotic features: Secondary | ICD-10-CM

## 2015-07-19 HISTORY — DX: Post-traumatic stress disorder, unspecified: F43.10

## 2015-07-19 LAB — CBG MONITORING, ED: Glucose-Capillary: 239 mg/dL — ABNORMAL HIGH (ref 65–99)

## 2015-07-19 MED ORDER — LORAZEPAM 2 MG/ML IJ SOLN
2.0000 mg | Freq: Four times a day (QID) | INTRAMUSCULAR | Status: DC | PRN
  Administered 2015-07-19: 2 mg via INTRAMUSCULAR
  Filled 2015-07-19: qty 1

## 2015-07-19 NOTE — ED Notes (Signed)
Patient given discharge instruction, verbalized understand. Patient ambulatory out of the department.  

## 2015-07-19 NOTE — ED Notes (Signed)
Pt ate 100% of meal, pt sleeping

## 2015-07-19 NOTE — Clinical Social Work Note (Signed)
CSW received call from [facility], disposition CSW reporting possible issue with [facility] returning to [facility]. CSW attempted to reach administrator, Hubbard Hartshorn but voicemail was full. Staff member at facility asked what was going on and CSW informed him that pt had been cleared and would need to be picked up. He reported understanding and stated that they would send transport. ED RN notified. No issues reported by facility during conversation.   Benay Pike, [place]

## 2015-07-19 NOTE — ED Notes (Signed)
Hubbard Hartshorn from [facility] called to check status of pt, told that pt would be discharged home and that they may come and pick her up.

## 2015-07-19 NOTE — ED Notes (Signed)
Pt complaining of hip pain, prn orders given

## 2015-07-19 NOTE — ED Provider Notes (Signed)
Patient cleared for discharge by behavioral health, but on preparation for return to her facility, the patient was eventually agitated, uncontrollable, requires additional calming methods.  [place] aware, and will re-eval  Carmin Muskrat, MD 07/19/15 1000

## 2015-07-19 NOTE — ED Notes (Signed)
Pt ambulatory to the bathroom with tech, yelling through the door.
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
## 2015-07-19 NOTE — ED Notes (Signed)
Pt ambulated to bathroom 

## 2015-07-19 NOTE — ED Notes (Signed)
Called to confirm facility is picking up pt, they are on the way.

## 2015-07-19 NOTE — ED Notes (Signed)
Spoke with Hubbard Hartshorn, administrator, she is arranging a ride for pt. Pt has agreed to go back to facility. Son in law refused to pick up.

## 2015-07-19 NOTE — ED Notes (Signed)
Pt states she does not want to go back to home because she does not feel safe there.  Attempted to call linda with no response.

## 2015-07-19 NOTE — ED Notes (Signed)
telepsy in progress

## 2015-07-19 NOTE — ED Provider Notes (Signed)
[facility] Emergency Department Provider Note  ____________________________________________  Time seen: Approximately 11:14 PM  I have reviewed the triage vital signs and the nursing notes.   HISTORY  Chief Complaint Psychiatric Evaluation    HPI Kendra Nicholson is a 64 y.o. female sent to the ED from her group home for behavioral health evaluation. Patient has a history of bipolar disorder who was evaluated yesterday at [facility] for the same. She was discharged back to her group home who sent her to our ED forevaluation of aggression. Patient states she asked her psychiatrist, Dr. Jerilee Hoh, to take her off her bipolar medications because she feels she does not have bipolar disorder. Patient denies active SI/HI/AH/VH. Voices no medical complaints.   Past Medical History  Diagnosis Date  . Anxiety   . Depression   . Hypertension   . Diabetes mellitus without complication ([place])   . Parkinson's disease ([place])   . Bipolar disorder ([place])   . Sleep apnea   . PTSD (post-traumatic stress disorder)     Patient Active Problem List   Diagnosis Date Noted  . PTSD (post-traumatic stress disorder) 05/09/2015  . Bipolar I disorder, current or most recent episode manic, with psychotic features with anxious distress ([place]) 05/09/2015  . Hypertension 05/09/2015  . Diabetes ([place]) 05/09/2015    No past surgical history on file.  Current Outpatient Rx  Name  Route  Sig  Dispense  Refill  . acetaminophen (TYLENOL) 325 MG tablet   Oral   Take 2 tablets (650 mg total) by mouth every 6 (six) hours as needed for mild pain.   120 tablet   0   . amLODipine (NORVASC) 10 MG tablet   Oral   Take 1 tablet (10 mg total) by mouth daily.   30 tablet   0   . busPIRone (BUSPAR) 15 MG tablet   Oral   Take 1 tablet (15 mg total) by mouth 2 (two) times daily.   60 tablet   0   . carbidopa-levodopa (SINEMET IR) 25-100 MG per tablet   Oral   Take 1 tablet by mouth 3  (three) times daily.   90 tablet   0   . citalopram (CELEXA) 40 MG tablet   Oral   Take 1 tablet (40 mg total) by mouth daily.   30 tablet   0   . EPINEPHrine (EPIPEN 2-PAK) 0.3 mg/0.3 mL IJ SOAJ injection   Intramuscular   Inject 0.3 mLs (0.3 mg total) into the muscle once as needed (for severe allergic reaction).   1 Device   0   . estradiol (ESTRACE) 1 MG tablet   Oral   Take 1 tablet (1 mg total) by mouth daily. Pt does not take on Wednesday and Saturday.   30 tablet   0   . fluticasone (FLONASE) 50 MCG/ACT nasal spray   Each Nare   Place 2 sprays into both nostrils daily.   16 g   0   . glipiZIDE (GLUCOTROL) 5 MG tablet   Oral   Take 1 tablet (5 mg total) by mouth daily.   30 tablet   0   . metFORMIN (GLUCOPHAGE) 1000 MG tablet   Oral   Take 1 tablet (1,000 mg total) by mouth 2 (two) times daily with a meal.   60 tablet   0   . metoprolol succinate (TOPROL-XL) 50 MG 24 hr tablet   Oral   Take 1 tablet (50 mg total) by mouth  daily.   30 tablet   0   . Multiple Vitamin (THEREMS) TABS   Oral   Take 1 tablet by mouth daily.   30 tablet   0   . pravastatin (PRAVACHOL) 20 MG tablet   Oral   Take 1 tablet (20 mg total) by mouth at bedtime.   30 tablet   0   . QUEtiapine (SEROQUEL) 50 MG tablet   Oral   Take 3 tablets (150 mg total) by mouth at bedtime.   30 tablet   0   . tiZANidine (ZANAFLEX) 2 MG tablet   Oral   Take 1 tablet (2 mg total) by mouth 2 (two) times daily.   60 tablet   0     Allergies Bee venom; Demerol; Ivp dye; Shellfish allergy; Latex; Adhesive; and Iodine  No family history on file.  Social History Social History  Substance Use Topics  . Smoking status: Former Research scientist (life sciences)  . Smokeless tobacco: None  . Alcohol Use: No    Review of Systems Constitutional: No fever/chills Eyes: No visual changes. ENT: No sore throat. Cardiovascular: Denies chest pain. Respiratory: Denies shortness of breath. Gastrointestinal: No  abdominal pain.  No nausea, no vomiting.  No diarrhea.  No constipation. Genitourinary: Negative for dysuria. Musculoskeletal: Negative for back pain. Skin: Negative for rash. Neurological: Negative for headaches, focal weakness or numbness. Psychiatric:Reportedly positive for aggression per the group home.  10-point ROS otherwise negative.  ____________________________________________   PHYSICAL EXAM:  VITAL SIGNS: ED Triage Vitals  Enc Vitals Group     BP 07/19/15 1956 153/82 mmHg     Pulse Rate 07/19/15 1956 108     Resp 07/19/15 1956 20     Temp 07/19/15 1956 99 F (37.2 C)     Temp Source 07/19/15 1956 Oral     SpO2 07/19/15 1956 97 %     Weight 07/19/15 1937 177 lb (80.287 kg)     Height 07/19/15 1937 5\' 6"  (1.676 m)     Head Cir --      Peak Flow --      Pain Score 07/19/15 1938 6     Pain Loc --      Pain Edu? --      Excl. in [place]? --     Constitutional: Asleep, easily awakened for exam. Alert and oriented. Well appearing and in no acute distress. Eyes: Conjunctivae are normal. PERRL. EOMI. Head: Atraumatic. Nose: No congestion/rhinnorhea. Mouth/Throat: Mucous membranes are moist.  Oropharynx non-erythematous. Neck: No stridor.  No carotid bruits. Cardiovascular: Normal rate, regular rhythm. Grossly normal heart sounds.  Good peripheral circulation. Respiratory: Normal respiratory effort.  No retractions. Lungs CTAB. Gastrointestinal: Soft and nontender. No distention. No abdominal bruits. No CVA tenderness. Musculoskeletal: No lower extremity tenderness nor edema.  No joint effusions. Neurologic:  Normal speech and language. No gross focal neurologic deficits are appreciated. No gait instability. Skin:  Skin is warm, dry and intact. No rash noted. Psychiatric: Mood and affect are normal. Speech and behavior are normal.  ____________________________________________   LABS (all labs ordered are listed, but only abnormal results are displayed)  Labs Reviewed  - No data to display ____________________________________________  EKG  None ____________________________________________  RADIOLOGY  None ____________________________________________   PROCEDURES  Procedure(s) performed: None  Critical Care performed: No  ____________________________________________   INITIAL IMPRESSION / ASSESSMENT AND PLAN / ED COURSE  Pertinent labs & imaging results that were available during my care of the patient were reviewed by me and  considered in my medical decision making (see chart for details).  64 year old female with a history of bipolar disorder who was evaluated yesterday at [facility] for behavioral issues and discharge back to her group home. Laboratory results from yesterday reviewed. Group home sent patient to our facility for similar behavioral evaluation. Patient poses no danger to herself or others at this time; she is calm and cooperative in the emergency department. Will consult TTS for evaluation in the ED. Patient is medically cleared at this time for psychiatric disposition.  ----------------------------------------- 4:29 AM on 07/20/2015 -----------------------------------------  Patient was evaluated by TTS who recommends discharge back to group home. Strict return precautions given. Patient verbalizes understanding and agrees with plan of care. ____________________________________________   FINAL CLINICAL IMPRESSION(S) / ED DIAGNOSES  Final diagnoses:  Bipolar I disorder, current or most recent episode manic, with psychotic features with anxious distress ([place])      Paulette Blanch, MD 07/20/15 [phone]

## 2015-07-19 NOTE — ED Notes (Signed)
Patient resting in bed at this time. Eyes closed. Even rise and fall of chest. NAD noted. Safety sitter at bedside at this time.

## 2015-07-19 NOTE — ED Notes (Signed)
Spoke with Sherren Mocha (son in law) who state him and his wife "do not want responsibility for [employer]. She is volatile and we are not going to take on the responsibility of her care. She stated to me she would rather live on the streets than go back to [facility]." Sherren Mocha stated he would not be coming to pick her up and that he wants her to be in a "mental hospital". Marcene Brawn, SW, called and notified, Dr Vanita Panda notified, and [facility] notified. Consult to Psychiatry placed with verbal order from Dr Vanita Panda to deem mental capacity of patient.

## 2015-07-19 NOTE — ED Notes (Signed)
Social work at bedside.  
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
## 2015-07-19 NOTE — Clinical Social Work Note (Signed)
Per psych assessment, pt has capacity and is psychiatrically stable. CSW discussed plan with pt and she states that she does not want to be homeless and is agreeable to return to [facility]. She requested that CSW call Sherren Mocha and make sure that he was not coming to hospital and also let him know plan. CSW spoke with Sherren Mocha who confirmed he is not coming here. He is aware pt is agreeable to return to facility. CSW and ED RN spoke with Hubbard Hartshorn, administrator at [facility]. She understands that pt has changed her mind and will cooperate with transfer back to facility. Vaughan Basta will arrange transport.   Benay Pike, [place]
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
## 2015-07-19 NOTE — ED Notes (Signed)

## 2015-07-19 NOTE — Discharge Instructions (Signed)
Please be sure to follow up with your physicians within one week to ensure that you are improving, and that your medications are appropriate.
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
## 2015-07-19 NOTE — Progress Notes (Signed)
Informed in a.m. Shift report that pt was evaluated by TTS and psychiatry recommended she does not meet inpatient criteria and is stable to d/c home (group home)- notes reflect group advised they may not accept pt back home.  [facility] CSW spoke with staff member this morning who stated pt can be picked up to return to group home this morning.  Will continue to follow.  Sharren Bridge, MSW, LCSW Clinical Social Work, Disposition  07/19/2015 [phone]
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
## 2015-07-19 NOTE — ED Notes (Signed)
Pt in room. No complaints or concerns voiced at this time. No abnormal behavior noted at this time. Will continue to monitor with q15 min checks. [employer] officer in area. 

## 2015-07-19 NOTE — ED Notes (Signed)
Kendra Nicholson, son in law talking to pt on phone at this time.

## 2015-07-19 NOTE — ED Notes (Signed)
Pt continue to scream and yell at staff, cursing, MD aware, advised to used PRN ativan orders

## 2015-07-20 MED ORDER — LORAZEPAM 1 MG PO TABS: 1.0000 mg | ORAL_TABLET | Freq: Four times a day (QID) | ORAL | Status: AC | PRN

## 2015-07-20 MED ORDER — ACETAMINOPHEN 325 MG PO TABS
650.0000 mg | ORAL_TABLET | Freq: Once | ORAL | Status: AC
  Administered 2015-07-20: 650 mg via ORAL
  Filled 2015-07-20: qty 2

## 2015-07-20 NOTE — ED Notes (Signed)
BEHAVIORAL HEALTH ROUNDING Patient sleeping: No. Patient alert and oriented: yes Behavior appropriate: Yes.   Nutrition and fluids offered: Yes  Toileting and hygiene offered: Yes  Sitter present: q15 min observations Law enforcement present: Yes [employer] 

## 2015-07-20 NOTE — Progress Notes (Signed)
Clinical Social worker received call from ED that patient's group home will not allow patient to return due to her behavior.  Call to [facility] [phone] to discuss patient returning.  Per Ms Abner Greenspan she has given patient a notice and due to threating staff and residents patient is not allowed to return. Patient has lived in this group home for about 2 months.  After talking with Ms. Abner Greenspan she agreed to come pick up patient if MD could give her a PRN medication to help keep her calm.  This was done by EDP.   Ms. Abner Greenspan states she has given patient her notice and Dec. 3rd will be her last day in her home.  Call to patient's son-in-law Sherren Mocha [phone] to inform him patient has received her 30 day notice at the group home and to verify that patient does not have a guardian. Per Sherren Mocha he does not know what to do with patient and states patient needs a guardian. Informed CSW this is patient's third group home in 3 months.  He is in agreement with contacting DSS and ok for CSW to provide his phone number for collateral information.     Call to [place] [facility] [phone]  spoke with Marcie Bal informed her patient will be homeless with medical needs and needs a guardian. DSS will follow up with patient at group home and contact patient's son-in-law for additional information.    Group Home will come to pick up patient today.  Casimer Lanius. Latanya Presser, MSW Clinical Social Work Department [phone] 3:24 PM

## 2015-07-20 NOTE — ED Notes (Signed)
BEHAVIORAL HEALTH ROUNDING Patient sleeping: No. Patient alert and oriented: yes Behavior appropriate: Yes.  ; If no, describe:  Nutrition and fluids offered: Yes  Toileting and hygiene offered: Yes  Sitter present: no Law enforcement present: No 

## 2015-07-20 NOTE — ED Notes (Signed)
Call to group home to pick pt up; no answer, voicemail full

## 2015-07-20 NOTE — ED Notes (Signed)
Pt in room. No complaints or concerns voiced at this time. No abnormal behavior noted at this time. Will continue to monitor with q15 min checks. [employer] officer in area. 

## 2015-07-20 NOTE — ED Notes (Signed)
Pt upset, refusing to let ED staff get vital sign. NAD noted.

## 2015-07-20 NOTE — ED Notes (Signed)
Spoke with Neoma Laming ; Education officer, museum who is calling [facility]

## 2015-07-20 NOTE — ED Notes (Signed)
Spoke with group home they will be picking up pt for discharge

## 2015-07-20 NOTE — ED Notes (Signed)
BEHAVIORAL HEALTH ROUNDING Patient sleeping: Yes.   Patient alert and oriented: not applicable Behavior appropriate: Yes.    Nutrition and fluids offered: No Toileting and hygiene offered: No Sitter present: q15 minute observations Law enforcement present: Yes [employer] 

## 2015-07-20 NOTE — ED Notes (Signed)
Spoke to [facility] she is sending someone to pick pt up

## 2015-07-20 NOTE — ED Notes (Signed)
Spoke with Neoma Laming ; Education officer, museum, she reports she spoke at [employer] to [facility], pt will be picked up today. Group home is requesting a PRN medication for agitation, Dr.Schaevitz notified, prescription written for Ativan for discharge

## 2015-07-20 NOTE — ED Notes (Signed)
Call from Director of group home who is refusing to pick pt up, social worker phone number given

## 2015-07-20 NOTE — Discharge Instructions (Signed)
Continue medications as directed by your psychiatrist. Return to the ER for worsening symptoms, feelings of hurting yourself or others, or other concerns.  Bipolar Disorder Bipolar disorder is a mental illness. The term bipolar disorder actually is used to describe a group of disorders that all share varying degrees of emotional highs and lows that can interfere with daily functioning, such as work, school, or relationships. Bipolar disorder also can lead to drug abuse, hospitalization, and suicide. The emotional highs of bipolar disorder are periods of elation or irritability and high energy. These highs can range from a mild form (hypomania) to a severe form (mania). People experiencing episodes of hypomania may appear energetic, excitable, and highly productive. People experiencing mania may behave impulsively or erratically. They often make poor decisions. They may have difficulty sleeping. The most severe episodes of mania can involve having very distorted beliefs or perceptions about the world and seeing or hearing things that are not real (psychotic delusions and hallucinations).  The emotional lows of bipolar disorder (depression) also can range from mild to severe. Severe episodes of bipolar depression can involve psychotic delusions and hallucinations. Sometimes people with bipolar disorder experience a state of mixed mood. Symptoms of hypomania or mania and depression are both present during this mixed-mood episode. SIGNS AND SYMPTOMS There are signs and symptoms of the episodes of hypomania and mania as well as the episodes of depression. The signs and symptoms of hypomania and mania are similar but vary in severity. They include:  Inflated self-esteem or feeling of increased self-confidence.  Decreased need for sleep.  Unusual talkativeness (rapid or pressured speech) or the feeling of a need to keep talking.  Sensation of racing thoughts or constant talking, with quick shifts between  topics that may or may not be related (flight of ideas).  Decreased ability to focus or concentrate.  Increased purposeful activity, such as work, studies, or social activity, or nonproductive activity, such as pacing, squirming and fidgeting, or finger and toe tapping.  Impulsive behavior and use of poor judgment, resulting in high-risk activities, such as having unprotected sex or spending excessive amounts of money. Signs and symptoms of depression include the following:   Feelings of sadness, hopelessness, or helplessness.  Frequent or uncontrollable episodes of crying.  Lack of feeling anything or caring about anything.  Difficulty sleeping or sleeping too much.  Inability to enjoy the things you used to enjoy.   Desire to be alone all the time.   Feelings of guilt or worthlessness.  Lack of energy or motivation.   Difficulty concentrating, remembering, or making decisions.  Change in appetite or weight beyond normal fluctuations.  Thoughts of death or the desire to harm yourself. DIAGNOSIS  Bipolar disorder is diagnosed through an assessment by your caregiver. Your caregiver will ask questions about your emotional episodes. There are two main types of bipolar disorder. People with type I bipolar disorder have manic episodes with or without depressive episodes. People with type II bipolar disorder have hypomanic episodes and major depressive episodes, which are more serious than mild depression. The type of bipolar disorder you have can make an important difference in how your illness is monitored and treated. Your caregiver may ask questions about your medical history and use of alcohol or drugs, including prescription medication. Certain medical conditions and substances also can cause emotional highs and lows that resemble bipolar disorder (secondary bipolar disorder).  TREATMENT  Bipolar disorder is a long-term illness. It is best controlled with continuous treatment  rather  than treatment only when symptoms occur. The following treatments can be prescribed for bipolar disorders:  Medication--Medication can be prescribed by a doctor that is an expert in treating mental disorders (psychiatrists). Medications called mood stabilizers are usually prescribed to help control the illness. Other medications are sometimes added if symptoms of mania, depression, or psychotic delusions and hallucinations occur despite the use of a mood stabilizer.  Talk therapy--Some forms of talk therapy are helpful in providing support, education, and guidance. A combination of medication and talk therapy is best for managing the disorder over time. A procedure in which electricity is applied to your brain through your scalp (electroconvulsive therapy) is used in cases of severe mania when medication and talk therapy do not work or work too slowly.   This information is not intended to replace advice given to you by your health care provider. Make sure you discuss any questions you have with your health care provider.   Document Released: 11/23/2000 Document Revised: 09/07/2014 Document Reviewed: 09/12/2012 Elsevier Interactive Patient Education [employer].

## 2015-07-20 NOTE — ED Notes (Signed)
Assumed patient care from [place], [place]. Pt resting comfortably in stretcher. NAD noted. Will continue to monitor.

## 2015-07-20 NOTE — ED Notes (Signed)

## 2015-12-11 ENCOUNTER — Encounter: Payer: Self-pay | Admitting: Emergency Medicine

## 2015-12-11 ENCOUNTER — Emergency Department
Admission: EM | Admit: 2015-12-11 | Discharge: 2015-12-11 | Disposition: A | Discharge status: Home / Self Care | Payer: Medicare Other | Attending: Student | Admitting: Student

## 2015-12-11 DIAGNOSIS — F319 Bipolar disorder, unspecified: Secondary | ICD-10-CM | POA: Insufficient documentation

## 2015-12-11 DIAGNOSIS — F4310 Post-traumatic stress disorder, unspecified: Secondary | ICD-10-CM

## 2015-12-11 DIAGNOSIS — E119 Type 2 diabetes mellitus without complications: Secondary | ICD-10-CM

## 2015-12-11 DIAGNOSIS — Z79899 Other long term (current) drug therapy: Secondary | ICD-10-CM | POA: Diagnosis not present

## 2015-12-11 DIAGNOSIS — I10 Essential (primary) hypertension: Secondary | ICD-10-CM | POA: Diagnosis not present

## 2015-12-11 DIAGNOSIS — Z7984 Long term (current) use of oral hypoglycemic drugs: Secondary | ICD-10-CM | POA: Insufficient documentation

## 2015-12-11 DIAGNOSIS — Z87891 Personal history of nicotine dependence: Secondary | ICD-10-CM | POA: Diagnosis not present

## 2015-12-11 DIAGNOSIS — F918 Other conduct disorders: Secondary | ICD-10-CM | POA: Diagnosis present

## 2015-12-11 LAB — URINE DRUG SCREEN, QUALITATIVE (ARMC ONLY)
Amphetamines, Ur Screen: NOT DETECTED
Barbiturates, Ur Screen: NOT DETECTED
Benzodiazepine, Ur Scrn: NOT DETECTED
Cannabinoid 50 Ng, Ur ~~LOC~~: NOT DETECTED
Cocaine Metabolite,Ur ~~LOC~~: NOT DETECTED
MDMA (Ecstasy)Ur Screen: NOT DETECTED
Methadone Scn, Ur: NOT DETECTED
Opiate, Ur Screen: NOT DETECTED
Phencyclidine (PCP) Ur S: NOT DETECTED
Tricyclic, Ur Screen: NOT DETECTED

## 2015-12-11 LAB — CBC
HCT: 38.9 % (ref 35.0–47.0)
Hemoglobin: 13.6 g/dL (ref 12.0–16.0)
MCH: 32.2 pg (ref 26.0–34.0)
MCHC: 35 g/dL (ref 32.0–36.0)
MCV: 91.9 fL (ref 80.0–100.0)
Platelets: 277 10*3/uL (ref 150–440)
RBC: 4.23 MIL/uL (ref 3.80–5.20)
RDW: 12.5 % (ref 11.5–14.5)
WBC: 8.3 10*3/uL (ref 3.6–11.0)

## 2015-12-11 LAB — ACETAMINOPHEN LEVEL: Acetaminophen (Tylenol), Serum: 13 ug/mL (ref 10–30)

## 2015-12-11 LAB — COMPREHENSIVE METABOLIC PANEL
ALT: 16 U/L (ref 14–54)
AST: 36 U/L (ref 15–41)
Albumin: 5.3 g/dL — ABNORMAL HIGH (ref 3.5–5.0)
Alkaline Phosphatase: 33 U/L — ABNORMAL LOW (ref 38–126)
Anion gap: 9 (ref 5–15)
BUN: 11 mg/dL (ref 6–20)
CO2: 22 mmol/L (ref 22–32)
Calcium: 9.9 mg/dL (ref 8.9–10.3)
Chloride: 105 mmol/L (ref 101–111)
Creatinine, Ser: 0.57 mg/dL (ref 0.44–1.00)
GFR calc Af Amer: >60 mL/min (ref >60)
GFR calc non Af Amer: >60 mL/min (ref >60)
Glucose, Bld: 67 mg/dL (ref 65–99)
Potassium: 3.8 mmol/L (ref 3.5–5.1)
Sodium: 136 mmol/L (ref 135–145)
Total Bilirubin: 0.7 mg/dL (ref 0.3–1.2)
Total Protein: 8.4 g/dL — ABNORMAL HIGH (ref 6.5–8.1)

## 2015-12-11 LAB — SALICYLATE LEVEL: Salicylate Lvl: <4 mg/dL (ref 2.8–30.0)

## 2015-12-11 LAB — ETHANOL: Alcohol, Ethyl (B): <5 mg/dL (ref <5)

## 2015-12-11 NOTE — ED Notes (Signed)
Pt given clothes to change out

## 2015-12-11 NOTE — ED Notes (Signed)

## 2015-12-11 NOTE — ED Notes (Signed)
[facility] declined pt.  Sts that though pt has been ambulatory to [facility] in quad, pt normally uses walker and is not appropriate for [facility]

## 2015-12-11 NOTE — ED Notes (Signed)
Called [facility] [phone]) for pick up as pt is being DC.  Talked to [place] who stated that administrator Ephriam Jenkins was refusing to take pt back.  Informed group hoe that pt had been medically cleared and calm/cooperative for this RN.  Charge informed and DSS informed.

## 2015-12-11 NOTE — ED Notes (Signed)
Spoke w/ Zadie Rhine of DSS, he requested that pt be held overnight.  Juleen China stated that there was no emergency placement available and that group home refused to pick up pt despite repercussions. Informed Scientist, water quality.

## 2015-12-11 NOTE — ED Notes (Signed)
Mayme Genta called from [facility] group home, stated that she would be coming to pick up pt.

## 2015-12-11 NOTE — Consult Note (Signed)
[place] Consult   Reason for Consult:  Consult for this 65 year old woman with a history of a suppose a diagnosis of PTSD but also a history of disruptive behavior and difficulty getting along with other people. Sent here from her group home apparently voluntarily because of continued irritating and argumentative behavior. Referring Physician:  Edd Fabian Patient Identification: Kendra Nicholson MRN:  [id number] Principal Diagnosis: PTSD (post-traumatic stress disorder) Diagnosis:   Patient Active Problem List   Diagnosis Date Noted  . PTSD (post-traumatic stress disorder) [F43.10] 05/09/2015  . Bipolar I disorder, current or most recent episode manic, with psychotic features with anxious distress ([place]) [F31.2] 05/09/2015  . Hypertension [I10] 05/09/2015  . Diabetes ([place]) [E11.9] 05/09/2015    Total Time spent with patient: 1 hour  Subjective:   Kendra Nicholson is a 65 y.o. Nicholson patient admitted with "she would not give me that man's name so that I could report him".  HPI:  Patient interviewed. Chart reviewed including multiple previous psychiatric encounters in my previous encounters with her. Labs and vitals reviewed. Patient is reporting to me that she got into yet another of her ongoing arguments with the people at her group home today. It sounds like the most recent thing she is upset about is that she claims there was a person working at the group home in the recent past who had been hostile towards her. She asked the owner for the person's name so that she could report them to the police. They refused to give her the full name and now the patient's irritated about it. According to some referral paperwork she had made some threats to other staff members and clients there in particular supposedly threatening that she was going to defecate on them. Patient doesn't mention anything about this and denies any thoughts to assault or hurt anyone and denies any suicidal ideation.  Denies any acute psychotic symptoms. Says that she has been taking her medicine regularly. Not abusing alcohol or drugs recently.  Social history: This is a 65 year old woman who is chronically impaired by behavior problems as well as medical problems. This is as far as I can tell about the fourth group home that she's been in possibly more in the last half year. She consistently gets herself thrown out of group homes because of her argumentative irritable and disruptive behaviors although she is not physically violent anyone else. During who have tried to be of some help to her although her behavior also gets in the way of them doing much for her.  Medical history: Multiple medical problems including high blood pressure and diabetes. She is on carbidopa not exactly sure why she didn't tell me she has Parkinson's disease and I don't see clear proof of that but she may have had a diagnosis of that in the past.  Substance abuse history: Patient says she used to have an alcohol abuse problem but stopped drinking 30 years ago and hasn't had any alcohol since then.  Past Psychiatric History: Patient has a history of PTSD and her chart and there was a diagnosis of bipolar disorder that has been listed in the past but she pointedly told me that her current psychiatrist, Dr. Randel Books, tells her that he doesn't think she has bipolar disorder because she has no history of mania. Acted with her several times I have to say that the way that she presents to me seems more like an adult autistic person than either just PTSD or mood disorder. She  is not currently having mood problems or is driven by psychotic problems and yet she just cannot get along with other people and is consistently disruptive and unpleasant in social environments with poor insight into this and how it affects her. She reports that she's tried to kill her self one time in the past some years ago. It looks like she's currently being prescribed a mixture  of antidepressants and quetiapine.  Risk to Self: Is patient at risk for suicide?: Yes Risk to Others:   Prior Inpatient Therapy:   Prior Outpatient Therapy:    Past Medical History:  Past Medical History  Diagnosis Date  . Anxiety   . Depression   . Hypertension   . Diabetes mellitus without complication ([place])   . Parkinson's disease ([place])   . Bipolar disorder ([place])   . Sleep apnea   . PTSD (post-traumatic stress disorder)    No past surgical history on file. Family History: No family history on file. Family Psychiatric  History: Patient denies knowing of any family history of mental illness Social History:  History  Alcohol Use No     History  Drug Use No    Social History   Social History  . Marital Status: Divorced    Spouse Name: N/A  . Number of Children: N/A  . Years of Education: N/A   Social History Main Topics  . Smoking status: Former Research scientist (life sciences)  . Smokeless tobacco: None  . Alcohol Use: No  . Drug Use: No  . Sexual Activity: Not Asked   Other Topics Concern  . None   Social History Narrative   Additional Social History:    Allergies:   Allergies  Allergen Reactions  . Bee Venom Anaphylaxis  . Demerol [Meperidine] Anaphylaxis  . Ivp Dye [Iodinated Diagnostic Agents] Anaphylaxis  . Shellfish Allergy Anaphylaxis  . Adhesive [Tape] Rash  . Iodine Rash  . Latex Rash    Labs:  Results for orders placed or performed during the hospital encounter of 12/11/15 (from the past 48 hour(s))  Comprehensive metabolic panel     Status: Abnormal   Collection Time: 12/11/15 12:16 PM  Result Value Ref Range   Sodium 136 135 - 145 mmol/L   Potassium 3.8 3.5 - 5.1 mmol/L   Chloride 105 101 - 111 mmol/L   CO2 22 22 - 32 mmol/L   Glucose, Bld 67 65 - 99 mg/dL   BUN 11 6 - 20 mg/dL   Creatinine, Ser 0.57 0.44 - 1.00 mg/dL   Calcium 9.9 8.9 - 10.3 mg/dL   Total Protein 8.4 (H) 6.5 - 8.1 g/dL   Albumin 5.3 (H) 3.5 - 5.0 g/dL   AST 36 15 - 41 U/L   ALT 16 14  - 54 U/L   Alkaline Phosphatase 33 (L) 38 - 126 U/L   Total Bilirubin 0.7 0.3 - 1.2 mg/dL   GFR calc non Af Amer >60 >60 mL/min   GFR calc Af Amer >60 >60 mL/min    Comment: (NOTE) The eGFR has been calculated using the CKD EPI equation. This calculation has not been validated in all clinical situations. eGFR's persistently <60 mL/min signify possible Chronic Kidney Disease.    Anion gap 9 5 - 15  Ethanol (ETOH)     Status: None   Collection Time: 12/11/15 12:16 PM  Result Value Ref Range   Alcohol, Ethyl (B) <5 <5 mg/dL    Comment:        LOWEST DETECTABLE LIMIT FOR SERUM  ALCOHOL IS 5 mg/dL FOR MEDICAL PURPOSES ONLY   Salicylate level     Status: None   Collection Time: 12/11/15 12:16 PM  Result Value Ref Range   Salicylate Lvl <7.8 2.8 - 30.0 mg/dL  Acetaminophen level     Status: None   Collection Time: 12/11/15 12:16 PM  Result Value Ref Range   Acetaminophen (Tylenol), Serum 13 10 - 30 ug/mL    Comment:        THERAPEUTIC CONCENTRATIONS VARY SIGNIFICANTLY. A RANGE OF 10-30 ug/mL MAY BE AN EFFECTIVE CONCENTRATION FOR MANY PATIENTS. HOWEVER, SOME ARE BEST TREATED AT CONCENTRATIONS OUTSIDE THIS RANGE. ACETAMINOPHEN CONCENTRATIONS >150 ug/mL AT 4 HOURS AFTER INGESTION AND >50 ug/mL AT 12 HOURS AFTER INGESTION ARE OFTEN ASSOCIATED WITH TOXIC REACTIONS.   CBC     Status: None   Collection Time: 12/11/15 12:16 PM  Result Value Ref Range   WBC 8.3 3.6 - 11.0 K/uL   RBC 4.23 3.80 - 5.20 MIL/uL   Hemoglobin 13.6 12.0 - 16.0 g/dL   HCT 38.9 35.0 - 47.0 %   MCV 91.9 80.0 - 100.0 fL   MCH 32.2 26.0 - 34.0 pg   MCHC 35.0 32.0 - 36.0 g/dL   RDW 12.5 11.5 - 14.5 %   Platelets 277 150 - 440 K/uL  Urine Drug Screen, Qualitative ([facility] only)     Status: None   Collection Time: 12/11/15  2:26 PM  Result Value Ref Range   Tricyclic, Ur Screen NONE DETECTED NONE DETECTED   Amphetamines, Ur Screen NONE DETECTED NONE DETECTED   MDMA (Ecstasy)Ur Screen NONE DETECTED NONE  DETECTED   Cocaine Metabolite,Ur [place] NONE DETECTED NONE DETECTED   Opiate, Ur Screen NONE DETECTED NONE DETECTED   Phencyclidine (PCP) Ur S NONE DETECTED NONE DETECTED   Cannabinoid 50 Ng, Ur [place] NONE DETECTED NONE DETECTED   Barbiturates, Ur Screen NONE DETECTED NONE DETECTED   Benzodiazepine, Ur Scrn NONE DETECTED NONE DETECTED   Methadone Scn, Ur NONE DETECTED NONE DETECTED    Comment: (NOTE) 242  Tricyclics, urine               Cutoff 1000 ng/mL 200  Amphetamines, urine             Cutoff 1000 ng/mL 300  MDMA (Ecstasy), urine           Cutoff 500 ng/mL 400  Cocaine Metabolite, urine       Cutoff 300 ng/mL 500  Opiate, urine                   Cutoff 300 ng/mL 600  Phencyclidine (PCP), urine      Cutoff 25 ng/mL 700  Cannabinoid, urine              Cutoff 50 ng/mL 800  Barbiturates, urine             Cutoff 200 ng/mL 900  Benzodiazepine, urine           Cutoff 200 ng/mL 1000 Methadone, urine                Cutoff 300 ng/mL 1100 1200 The urine drug screen provides only a preliminary, unconfirmed 1300 analytical test result and should not be used for non-medical 1400 purposes. Clinical consideration and professional judgment should 1500 be applied to any positive drug screen result due to possible 1600 interfering substances. A more specific alternate chemical method 1700 must be used in order to obtain a confirmed analytical result.  1800 Gas chromato graphy / mass spectrometry (GC/MS) is the preferred 1900 confirmatory method.     No current facility-administered medications for this encounter.   Current Outpatient Prescriptions  Medication Sig Dispense Refill  . acetaminophen (TYLENOL) 325 MG tablet Take 2 tablets (650 mg total) by mouth every 6 (six) hours as needed for mild pain. 120 tablet 0  . amLODipine (NORVASC) 10 MG tablet Take 1 tablet (10 mg total) by mouth daily. 30 tablet 0  . busPIRone (BUSPAR) 15 MG tablet Take 1 tablet (15 mg total) by mouth 2 (two) times daily.  60 tablet 0  . carbidopa-levodopa (SINEMET IR) 25-100 MG per tablet Take 1 tablet by mouth 3 (three) times daily. 90 tablet 0  . citalopram (CELEXA) 40 MG tablet Take 1 tablet (40 mg total) by mouth daily. 30 tablet 0  . estradiol (ESTRACE) 1 MG tablet Take 1 tablet (1 mg total) by mouth daily. Pt does not take on Wednesday and Saturday. 30 tablet 0  . fluticasone (FLONASE) 50 MCG/ACT nasal spray Place 2 sprays into both nostrils daily. 16 g 0  . glipiZIDE (GLUCOTROL) 5 MG tablet Take 1 tablet (5 mg total) by mouth daily. 30 tablet 0  . LORazepam (ATIVAN) 1 MG tablet Take 1 tablet (1 mg total) by mouth every 6 (six) hours as needed (agitation). 6 tablet 0  . metFORMIN (GLUCOPHAGE) 1000 MG tablet Take 1 tablet (1,000 mg total) by mouth 2 (two) times daily with a meal. 60 tablet 0  . metoprolol succinate (TOPROL-XL) 50 MG 24 hr tablet Take 1 tablet (50 mg total) by mouth daily. 30 tablet 0  . Multiple Vitamin (THEREMS) TABS Take 1 tablet by mouth daily. 30 tablet 0  . naproxen (NAPROSYN) 375 MG tablet Take 375 mg by mouth 3 (three) times daily with meals.    . pravastatin (PRAVACHOL) 20 MG tablet Take 1 tablet (20 mg total) by mouth at bedtime. 30 tablet 0  . QUEtiapine (SEROQUEL) 50 MG tablet Take 3 tablets (150 mg total) by mouth at bedtime. 30 tablet 0  . tiZANidine (ZANAFLEX) 2 MG tablet Take 1 tablet (2 mg total) by mouth 2 (two) times daily. 60 tablet 0  . EPINEPHrine (EPIPEN 2-PAK) 0.3 mg/0.3 mL IJ SOAJ injection Inject 0.3 mLs (0.3 mg total) into the muscle once as needed (for severe allergic reaction). 1 Device 0    Musculoskeletal: Strength & Muscle Tone: within normal limits Gait & Station: normal Patient leans: N/A  Psychiatric Specialty Exam: Review of Systems  Constitutional: Negative.   HENT: Negative.   Eyes: Negative.   Respiratory: Negative.   Cardiovascular: Negative.   Gastrointestinal: Negative.   Musculoskeletal: Negative.   Skin: Negative.   Neurological:  Negative.   Psychiatric/Behavioral: Negative for depression, suicidal ideas, hallucinations, memory loss and substance abuse. The patient is not nervous/anxious and does not have insomnia.     Blood pressure 156/69, pulse 84, temperature 98.6 F (37 C), temperature source Oral, resp. rate 18, height 5' 6"  (1.676 m), weight 80.287 kg (177 lb), SpO2 98 %.Body mass index is 28.58 kg/(m^2).  General Appearance: Casual  Eye Contact::  Fair  Speech:  Normal Rate  Volume:  Normal  Mood:  Euthymic  Affect:  Constricted  Thought Process:  Tangential  Orientation:  Full (Time, Place, and Person)  Thought Content:  Negative  Suicidal Thoughts:  No  Homicidal Thoughts:  No  Memory:  Immediate;   Fair Recent;   Fair Remote;   Fair  Judgement:  Impaired  Insight:  Lacking  Psychomotor Activity:  Decreased  Concentration:  Fair  Recall:  [employer] of Knowledge:Fair  Language: Fair  Akathisia:  No  Handed:  Right  AIMS (if indicated):     Assets:  Financial Resources/Insurance Housing Resilience  ADL's:  Intact  Cognition: WNL  Sleep:      Treatment Plan Summary: Medication management and Plan 64 year old woman currently appears to be at her baseline. She is not psychotic. She is not expressing any suicidal thoughts or showing dangerous behavior to herself. She denies homicidal ideation and is not displaying any violence or aggression to others. Patient has a chronic problem with inappropriate social behaviors. Not likely to benefit from acute hospitalization or any change to treatment. No longer meets any commitment criteria if she ever did. Does not require hospital level treatment. Case reviewed with emergency room doctor and TTS. I tried to do some counseling to the patient but she is so rigid in her behavior that it's virtually impossible. She can be released from the emergency room back to her group home at the discretion of the ER physician.  Disposition: Patient does not meet criteria  for psychiatric inpatient admission. Supportive therapy provided about ongoing stressors.  Alethia Berthold, MD 12/11/2015 4:03 PM

## 2015-12-11 NOTE — ED Notes (Signed)
Paperwork given to caregiver.

## 2015-12-11 NOTE — ED Notes (Signed)
Brought by staff at [facility].  Pt is reportedly threatening other patients and staff at the faciltiy and also threatening to defacate on them.  Patient is coopertive with me and says she just gets loud and is not homicidal.  Says she is suicidal without a plan.  She refused to have bp taken by dinamap, but agrees to a manual bp in back

## 2015-12-11 NOTE — Discharge Instructions (Signed)
Bipolar Disorder Bipolar disorder is a mental illness. The term bipolar disorder actually is used to describe a group of disorders that all share varying degrees of emotional highs and lows that can interfere with daily functioning, such as work, school, or relationships. Bipolar disorder also can lead to drug abuse, hospitalization, and suicide. The emotional highs of bipolar disorder are periods of elation or irritability and high energy. These highs can range from a mild form (hypomania) to a severe form (mania). People experiencing episodes of hypomania may appear energetic, excitable, and highly productive. People experiencing mania may behave impulsively or erratically. They often make poor decisions. They may have difficulty sleeping. The most severe episodes of mania can involve having very distorted beliefs or perceptions about the world and seeing or hearing things that are not real (psychotic delusions and hallucinations).  The emotional lows of bipolar disorder (depression) also can range from mild to severe. Severe episodes of bipolar depression can involve psychotic delusions and hallucinations. Sometimes people with bipolar disorder experience a state of mixed mood. Symptoms of hypomania or mania and depression are both present during this mixed-mood episode. SIGNS AND SYMPTOMS There are signs and symptoms of the episodes of hypomania and mania as well as the episodes of depression. The signs and symptoms of hypomania and mania are similar but vary in severity. They include:  Inflated self-esteem or feeling of increased self-confidence.  Decreased need for sleep.  Unusual talkativeness (rapid or pressured speech) or the feeling of a need to keep talking.  Sensation of racing thoughts or constant talking, with quick shifts between topics that may or may not be related (flight of ideas).  Decreased ability to focus or concentrate.  Increased purposeful activity, such as work, studies,  or social activity, or nonproductive activity, such as pacing, squirming and fidgeting, or finger and toe tapping.  Impulsive behavior and use of poor judgment, resulting in high-risk activities, such as having unprotected sex or spending excessive amounts of money. Signs and symptoms of depression include the following:   Feelings of sadness, hopelessness, or helplessness.  Frequent or uncontrollable episodes of crying.  Lack of feeling anything or caring about anything.  Difficulty sleeping or sleeping too much.  Inability to enjoy the things you used to enjoy.   Desire to be alone all the time.   Feelings of guilt or worthlessness.  Lack of energy or motivation.   Difficulty concentrating, remembering, or making decisions.  Change in appetite or weight beyond normal fluctuations.  Thoughts of death or the desire to harm yourself. DIAGNOSIS  Bipolar disorder is diagnosed through an assessment by your caregiver. Your caregiver will ask questions about your emotional episodes. There are two main types of bipolar disorder. People with type I bipolar disorder have manic episodes with or without depressive episodes. People with type II bipolar disorder have hypomanic episodes and major depressive episodes, which are more serious than mild depression. The type of bipolar disorder you have can make an important difference in how your illness is monitored and treated. Your caregiver may ask questions about your medical history and use of alcohol or drugs, including prescription medication. Certain medical conditions and substances also can cause emotional highs and lows that resemble bipolar disorder (secondary bipolar disorder).  TREATMENT  Bipolar disorder is a long-term illness. It is best controlled with continuous treatment rather than treatment only when symptoms occur. The following treatments can be prescribed for bipolar disorders:  Medication--Medication can be prescribed by  a doctor that  is an expert in treating mental disorders (psychiatrists). Medications called mood stabilizers are usually prescribed to help control the illness. Other medications are sometimes added if symptoms of mania, depression, or psychotic delusions and hallucinations occur despite the use of a mood stabilizer. °· Talk therapy--Some forms of talk therapy are helpful in providing support, education, and guidance. °A combination of medication and talk therapy is best for managing the disorder over time. A procedure in which electricity is applied to your brain through your scalp (electroconvulsive therapy) is used in cases of severe mania when medication and talk therapy do not work or work too slowly. °  °This information is not intended to replace advice given to you by your health care provider. Make sure you discuss any questions you have with your health care provider. °  °Document Released: 11/23/2000 Document Revised: 09/07/2014 Document Reviewed: 09/12/2012 °Elsevier Interactive Patient Education ©2016 Elsevier Inc. ° °

## 2015-12-11 NOTE — ED Notes (Signed)
DSS called and gave officer brief report.

## 2015-12-11 NOTE — BH Assessment (Signed)
Patient has been seen by Psychiatrist, Dr. Weber Cooks and was cleared. Patient will be discharged. TTS Consult isn't needed at this time. Writer talked with ER MD, Dr. Edd Fabian and Consult for TTS will be discontinued.

## 2015-12-11 NOTE — ED Notes (Signed)
ENVIRONMENTAL ASSESSMENT Potentially harmful objects out of patient reach: YES Personal belongings secured: YES Patient dressed in hospital provided attire only: YES Plastic bags out of patient reach: YES Patient care equipment (cords, cables, call bells, lines, and drains) shortened, removed, or accounted for: YES Equipment and supplies removed from bottom of stretcher: YES Potentially toxic materials out of patient reach: Film/video editor removed or out of patient reach: [place] Patient sleeping: YES Patient alert and oriented: YES  Behavior appropriate: no Describe behavior: No inappropriate or unacceptable behaviors noted at this time.  Nutrition and fluids offered: YES Toileting and hygiene offered: YES Sitter present: Industrial/product designer rounding every 15 minutes on patient to ensure safety.  Law enforcement present: Programmer, applications agency: [place] ([place])

## 2015-12-11 NOTE — ED Notes (Signed)
Pt in good mood. Laughing and cutting up with security.

## 2015-12-11 NOTE — ED Provider Notes (Addendum)
[facility] Emergency Department Provider Note  ____________________________________________  Time seen: Approximately 1:53 PM  I have reviewed the triage vital signs and the nursing notes.   HISTORY  Chief Complaint Aggressive Behavior    HPI Kendra Nicholson is a 65 y.o. female with history of bipolar disorder, PTSD, diabetes, hypertension, Parkinson's disease who presents for evaluation of vague suicidality without plan, gradual onset, constant since onset, currently mild. Patient reports that she was very upset at her living facility today because she felt as if the staff were threatening her. She then threatened to defecate on them. No homicidal ideation or audiovisual hallucinations. No chest pain, difficulty breathing, vomiting, diarrhea, fever or chills.   Past Medical History  Diagnosis Date  . Anxiety   . Depression   . Hypertension   . Diabetes mellitus without complication ([place])   . Parkinson's disease ([place])   . Bipolar disorder ([place])   . Sleep apnea   . PTSD (post-traumatic stress disorder)     Patient Active Problem List   Diagnosis Date Noted  . PTSD (post-traumatic stress disorder) 05/09/2015  . Bipolar I disorder, current or most recent episode manic, with psychotic features with anxious distress ([place]) 05/09/2015  . Hypertension 05/09/2015  . Diabetes ([place]) 05/09/2015    No past surgical history on file.  Current Outpatient Rx  Name  Route  Sig  Dispense  Refill  . acetaminophen (TYLENOL) 325 MG tablet   Oral   Take 2 tablets (650 mg total) by mouth every 6 (six) hours as needed for mild pain.   120 tablet   0   . amLODipine (NORVASC) 10 MG tablet   Oral   Take 1 tablet (10 mg total) by mouth daily.   30 tablet   0   . busPIRone (BUSPAR) 15 MG tablet   Oral   Take 1 tablet (15 mg total) by mouth 2 (two) times daily.   60 tablet   0   . carbidopa-levodopa (SINEMET IR) 25-100 MG per tablet   Oral   Take 1 tablet by  mouth 3 (three) times daily.   90 tablet   0   . citalopram (CELEXA) 40 MG tablet   Oral   Take 1 tablet (40 mg total) by mouth daily.   30 tablet   0   . estradiol (ESTRACE) 1 MG tablet   Oral   Take 1 tablet (1 mg total) by mouth daily. Pt does not take on Wednesday and Saturday.   30 tablet   0   . fluticasone (FLONASE) 50 MCG/ACT nasal spray   Each Nare   Place 2 sprays into both nostrils daily.   16 g   0   . glipiZIDE (GLUCOTROL) 5 MG tablet   Oral   Take 1 tablet (5 mg total) by mouth daily.   30 tablet   0   . LORazepam (ATIVAN) 1 MG tablet   Oral   Take 1 tablet (1 mg total) by mouth every 6 (six) hours as needed (agitation).   6 tablet   0   . metFORMIN (GLUCOPHAGE) 1000 MG tablet   Oral   Take 1 tablet (1,000 mg total) by mouth 2 (two) times daily with a meal.   60 tablet   0   . metoprolol succinate (TOPROL-XL) 50 MG 24 hr tablet   Oral   Take 1 tablet (50 mg total) by mouth daily.   30 tablet   0   . Multiple  Vitamin (THEREMS) TABS   Oral   Take 1 tablet by mouth daily.   30 tablet   0   . naproxen (NAPROSYN) 375 MG tablet   Oral   Take 375 mg by mouth 3 (three) times daily with meals.         . pravastatin (PRAVACHOL) 20 MG tablet   Oral   Take 1 tablet (20 mg total) by mouth at bedtime.   30 tablet   0   . QUEtiapine (SEROQUEL) 50 MG tablet   Oral   Take 3 tablets (150 mg total) by mouth at bedtime.   30 tablet   0   . tiZANidine (ZANAFLEX) 2 MG tablet   Oral   Take 1 tablet (2 mg total) by mouth 2 (two) times daily.   60 tablet   0   . EPINEPHrine (EPIPEN 2-PAK) 0.3 mg/0.3 mL IJ SOAJ injection   Intramuscular   Inject 0.3 mLs (0.3 mg total) into the muscle once as needed (for severe allergic reaction).   1 Device   0     Allergies Bee venom; Demerol; Ivp dye; Shellfish allergy; Adhesive; Iodine; and Latex  No family history on file.  Social History Social History  Substance Use Topics  . Smoking status: Former  Research scientist (life sciences)  . Smokeless tobacco: None  . Alcohol Use: No    Review of Systems Constitutional: No fever/chills Eyes: No visual changes. ENT: No sore throat. Cardiovascular: Denies chest pain. Respiratory: Denies shortness of breath. Gastrointestinal: No abdominal pain.  No nausea, no vomiting.  No diarrhea.  No constipation. Genitourinary: Negative for dysuria. Musculoskeletal: Negative for back pain. Skin: Negative for rash. Neurological: Negative for headaches, focal weakness or numbness.  10-point ROS otherwise negative.  ____________________________________________   PHYSICAL EXAM:  VITAL SIGNS: ED Triage Vitals  Enc Vitals Group     BP 12/11/15 1202 156/69 mmHg     Pulse Rate 12/11/15 1202 84     Resp 12/11/15 1202 18     Temp 12/11/15 1202 98.6 F (37 C)     Temp Source 12/11/15 1202 Oral     SpO2 12/11/15 1202 98 %     Weight 12/11/15 1202 177 lb (80.287 kg)     Height 12/11/15 1202 5\' 6"  (1.676 m)     Head Cir --      Peak Flow --      Pain Score --      Pain Loc --      Pain Edu? --      Excl. in [place]? --     Constitutional: Alert and oriented. Well appearing and in no acute distress. Eyes: Conjunctivae are normal. PERRL. EOMI. Head: Atraumatic. Nose: No congestion/rhinnorhea. Mouth/Throat: Mucous membranes are moist.  Oropharynx non-erythematous. Neck: No stridor. Supple without meningismus. Cardiovascular: Normal rate, regular rhythm. Grossly normal heart sounds.  Good peripheral circulation. Respiratory: Normal respiratory effort.  No retractions. Lungs CTAB. Gastrointestinal: Soft and nontender. No distention.  No CVA tenderness. Genitourinary: deferred Musculoskeletal: No lower extremity tenderness nor edema.  No joint effusions. Neurologic:  Normal speech and language. No gross focal neurologic deficits are appreciated. No gait instability. Skin:  Skin is warm, dry and intact. No rash noted. Psychiatric: Mood is normal, affect is restricted. Speech and  behavior are normal.  ____________________________________________   LABS (all labs ordered are listed, but only abnormal results are displayed)  Labs Reviewed  COMPREHENSIVE METABOLIC PANEL - Abnormal; Notable for the following:    Total Protein 8.4 (*)  Albumin 5.3 (*)    Alkaline Phosphatase 33 (*)    All other components within normal limits  ETHANOL  SALICYLATE LEVEL  ACETAMINOPHEN LEVEL  CBC  URINE DRUG SCREEN, QUALITATIVE ([facility] ONLY)   ____________________________________________  EKG  none ____________________________________________  RADIOLOGY  none ____________________________________________   PROCEDURES  Procedure(s) performed: None  Critical Care performed: No  ____________________________________________   INITIAL IMPRESSION / ASSESSMENT AND PLAN / ED COURSE  Pertinent labs & imaging results that were available during my care of the patient were reviewed by me and considered in my medical decision making (see chart for details).  Kendra Nicholson is a 65 y.o. female with history of bipolar disorder, PTSD, diabetes, hypertension, Parkinson's disease who presents for evaluation of vague suicidality without plan as well as an angry outburst at her group home. On exam, she is well-appearing and in no acute distress. Vital signs stable, she is afebrile. Labs reviewed and are generally unremarkable. She is medically cleared. I discussed case with Dr. Weber Cooks of psychiatry who has evaluated her and recommends discharge back to her living facility. She will be discharged home as long as her group home is able to take her back today. ____________________________________________   FINAL CLINICAL IMPRESSION(S) / ED DIAGNOSES  Final diagnoses:  Bipolar 1 disorder ([place])      Joanne Gavel, MD 12/11/15 1609  Joanne Gavel, MD 12/11/15 1609

## 2015-12-11 NOTE — ED Notes (Signed)
Pt given supper tray.

## 2015-12-12 ENCOUNTER — Encounter: Payer: Self-pay | Admitting: Emergency Medicine

## 2015-12-12 ENCOUNTER — Emergency Department
Admission: EM | Admit: 2015-12-12 | Discharge: 2015-12-12 | Disposition: A | Discharge status: Home / Self Care | Payer: Medicare Other | Attending: Emergency Medicine | Admitting: Emergency Medicine

## 2015-12-12 DIAGNOSIS — E119 Type 2 diabetes mellitus without complications: Secondary | ICD-10-CM | POA: Diagnosis not present

## 2015-12-12 DIAGNOSIS — F919 Conduct disorder, unspecified: Secondary | ICD-10-CM | POA: Diagnosis not present

## 2015-12-12 DIAGNOSIS — G20 Parkinson's disease: Secondary | ICD-10-CM | POA: Insufficient documentation

## 2015-12-12 DIAGNOSIS — Z791 Long term (current) use of non-steroidal anti-inflammatories (NSAID): Secondary | ICD-10-CM | POA: Insufficient documentation

## 2015-12-12 DIAGNOSIS — R451 Restlessness and agitation: Secondary | ICD-10-CM | POA: Diagnosis present

## 2015-12-12 DIAGNOSIS — F609 Personality disorder, unspecified: Secondary | ICD-10-CM | POA: Diagnosis not present

## 2015-12-12 DIAGNOSIS — Z7984 Long term (current) use of oral hypoglycemic drugs: Secondary | ICD-10-CM | POA: Diagnosis not present

## 2015-12-12 DIAGNOSIS — Z87891 Personal history of nicotine dependence: Secondary | ICD-10-CM | POA: Diagnosis not present

## 2015-12-12 DIAGNOSIS — I10 Essential (primary) hypertension: Secondary | ICD-10-CM | POA: Diagnosis not present

## 2015-12-12 DIAGNOSIS — F312 Bipolar disorder, current episode manic severe with psychotic features: Secondary | ICD-10-CM | POA: Diagnosis not present

## 2015-12-12 DIAGNOSIS — F4310 Post-traumatic stress disorder, unspecified: Secondary | ICD-10-CM | POA: Diagnosis not present

## 2015-12-12 DIAGNOSIS — IMO0002 Reserved for concepts with insufficient information to code with codable children: Secondary | ICD-10-CM

## 2015-12-12 DIAGNOSIS — Z79899 Other long term (current) drug therapy: Secondary | ICD-10-CM | POA: Insufficient documentation

## 2015-12-12 DIAGNOSIS — F418 Other specified anxiety disorders: Secondary | ICD-10-CM | POA: Insufficient documentation

## 2015-12-12 DIAGNOSIS — Z91040 Latex allergy status: Secondary | ICD-10-CM | POA: Diagnosis not present

## 2015-12-12 LAB — COMPREHENSIVE METABOLIC PANEL
ALT: 12 U/L — ABNORMAL LOW (ref 14–54)
AST: 31 U/L (ref 15–41)
Albumin: 5.4 g/dL — ABNORMAL HIGH (ref 3.5–5.0)
Alkaline Phosphatase: 32 U/L — ABNORMAL LOW (ref 38–126)
Anion gap: 10 (ref 5–15)
BUN: 11 mg/dL (ref 6–20)
CO2: 22 mmol/L (ref 22–32)
Calcium: 10.2 mg/dL (ref 8.9–10.3)
Chloride: 103 mmol/L (ref 101–111)
Creatinine, Ser: 0.55 mg/dL (ref 0.44–1.00)
GFR calc Af Amer: >60 mL/min (ref >60)
GFR calc non Af Amer: >60 mL/min (ref >60)
Glucose, Bld: 78 mg/dL (ref 65–99)
Potassium: 3.7 mmol/L (ref 3.5–5.1)
Sodium: 135 mmol/L (ref 135–145)
Total Bilirubin: 0.6 mg/dL (ref 0.3–1.2)
Total Protein: 8.4 g/dL — ABNORMAL HIGH (ref 6.5–8.1)

## 2015-12-12 LAB — CBC
HCT: 39.7 % (ref 35.0–47.0)
Hemoglobin: 13.8 g/dL (ref 12.0–16.0)
MCH: 32 pg (ref 26.0–34.0)
MCHC: 34.9 g/dL (ref 32.0–36.0)
MCV: 91.7 fL (ref 80.0–100.0)
Platelets: 283 10*3/uL (ref 150–440)
RBC: 4.32 MIL/uL (ref 3.80–5.20)
RDW: 12.4 % (ref 11.5–14.5)
WBC: 9.3 10*3/uL (ref 3.6–11.0)

## 2015-12-12 LAB — ETHANOL: Alcohol, Ethyl (B): <5 mg/dL (ref <5)

## 2015-12-12 LAB — SALICYLATE LEVEL: Salicylate Lvl: <4 mg/dL (ref 2.8–30.0)

## 2015-12-12 LAB — ACETAMINOPHEN LEVEL: Acetaminophen (Tylenol), Serum: <10 ug/mL — ABNORMAL LOW (ref 10–30)

## 2015-12-12 LAB — GLUCOSE, CAPILLARY: Glucose-Capillary: 82 mg/dL (ref 65–99)

## 2015-12-12 MED ORDER — ACETAMINOPHEN 325 MG PO TABS
650.0000 mg | ORAL_TABLET | Freq: Once | ORAL | Status: AC
  Administered 2015-12-12: 650 mg via ORAL
  Filled 2015-12-12: qty 2

## 2015-12-12 NOTE — ED Notes (Signed)
Pt refusing discharge vital signs.  Pt alert and in no acute distress.
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
## 2015-12-12 NOTE — ED Notes (Signed)
Pt discharged to home.  Group Home caregiver driving.  Discharge instructions reviewed with caregiver and patient.  Verbalized understanding.  No questions or concerns at this time.  Teach back verified.  Pt in NAD.  No items left in ED.

## 2015-12-12 NOTE — ED Notes (Signed)
BEHAVIORAL HEALTH ROUNDING Patient sleeping: Yes.   Patient alert and oriented: Pt asleep Behavior appropriate: Yes.  ; If no, describe:  Nutrition and fluids offered: Pt Asleep Toileting and hygiene offered: pt asleep Sitter present: ED Rover Present Law enforcement present: Yes

## 2015-12-12 NOTE — Discharge Instructions (Signed)
Anger Management Anger is a normal human emotion. However, anger can range from mild irritation to rage. When your anger becomes harmful to yourself or others, it is unhealthy anger.  CAUSES  There are many reasons for unhealthy anger. Many people learn how to express anger from observing how their family expressed anger. In troubled, chaotic, or abusive families, anger can be expressed as rage or even violence. Children can grow up never learning how healthy anger can be expressed. Factors that contribute to unhealthy anger include:   Drug or alcohol abuse.  Post-traumatic stress disorder.  Traumatic brain injury. COMPLICATIONS  People with unhealthy anger tend to overreact and retaliate against a real or imagined threat. The need to retaliate can turn into violence or verbal abuse against another person. Chronic anger can lead to health problems, such as hypertension, high blood pressure, and depression. TREATMENT  Exercising, relaxing, meditating, or writing out your feelings all can be beneficial in managing moderate anger. For unhealthy anger, the following methods may be used:  Cognitive-behavioral counseling (learning skills to change the thoughts that influence your mood).  Relaxation training.  Interpersonal counseling.  Assertive communication skills.  Medication.   This information is not intended to replace advice given to you by your health care provider. Make sure you discuss any questions you have with your health care provider.   Document Released: 06/14/2007 Document Revised: 11/09/2011 Document Reviewed: 10/23/2010 Elsevier Interactive Patient Education [employer].  Please return immediately if condition worsens. Please contact her primary physician or the physician you were given for referral. If you have any specialist physicians involved in her treatment and plan please also contact them. Thank you for using [facility].
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
## 2015-12-12 NOTE — ED Notes (Signed)
Called group home. They will come pick up pt.

## 2015-12-12 NOTE — Consult Note (Signed)
[facility] Face-to-Face Psychiatry Consult   Reason for Consult:  Consult for this 65 year old woman with a history of mood instability and behavior problems and was brought in voluntarily by her group home because of reports of disruptive behavior. Referring Physician:  Joni Fears Patient Identification: Kendra Nicholson MRN:  [id number] Principal Diagnosis: Personality disorder Diagnosis:   Patient Active Problem List   Diagnosis Date Noted  . Personality disorder [F60.9] 12/12/2015  . PTSD (post-traumatic stress disorder) [F43.10] 05/09/2015  . Bipolar I disorder, current or most recent episode manic, with psychotic features with anxious distress ([place]) [F31.2] 05/09/2015  . Hypertension [I10] 05/09/2015  . Diabetes ([place]) [E11.9] 05/09/2015    Total Time spent with patient: 45 minutes  Subjective:   Kendra Nicholson is a 65 y.o. female patient admitted with "they said that I was too loud".  HPI:  Patient interviewed. She was just here yesterday so we have recent history. She also has had labs redraw none. Case reviewed with the ER and TTS. 65 year old woman with a history of mood instability and behavior problems who has been given a diagnosis of PTSD and possibly bipolar disorder in the past but to my examination on several encounters seems to mostly have characteristics more typical of autism and personality disorder. Patient was discharged from the emergency room yesterday afternoon. She says that when she got back the staff at her group home did not have her medicine [name] and she was not able to get her psychiatric medicine last night. She says that this morning the staff were berating her and treating her badly and yelling at her. She says that they told her that they did not want her there anymore. Patient reports that the staff has complained that she, the patient, had defecated on the floor, but the patient denies that this ever happened. I know that that was something that was mentioned in  yesterday's paperwork but I haven't seen any new report of that. Patient denies that she's having any hallucinations. She denies any wish to harm herself. She denies any thoughts about harming or killing anyone else.  Social history: Asian has children and possibly other extended family but they are only minimally involved with her. She is currently living in a group home. She has had several placements in the past that have fallen through because of her behavior problems.  Medical history: History of high blood pressure. Tree of diabetes.  Substance abuse history: Denies any alcohol or drug abuse. Doesn't appear to of been a major problem in the past.  Past Psychiatric History: Patient has a history of behavior problems. She tends to be argumentative and have very poor interpersonal skills from what I can see. Often has complaints about the group homes where she is living. Patient does not appear to have a history of serious violence to others. Denies any suicide attempts. She has been treated with medications and is currently compliant. Has outpatient treatment already in place.  Risk to Self: Is patient at risk for suicide?: No Risk to Others:   Prior Inpatient Therapy:   Prior Outpatient Therapy:    Past Medical History:  Past Medical History  Diagnosis Date  . Anxiety   . Depression   . Hypertension   . Diabetes mellitus without complication ([place])   . Parkinson's disease ([place])   . Bipolar disorder ([place])   . Sleep apnea   . PTSD (post-traumatic stress disorder)    History reviewed. No pertinent past surgical history. Family History: No  family history on file. Family Psychiatric  History: No information available Social History:  History  Alcohol Use No     History  Drug Use No    Social History   Social History  . Marital Status: Divorced    Spouse Name: N/A  . Number of Children: N/A  . Years of Education: N/A   Social History Main Topics  . Smoking status: Former  Research scientist (life sciences)  . Smokeless tobacco: None  . Alcohol Use: No  . Drug Use: No  . Sexual Activity: Not Asked   Other Topics Concern  . None   Social History Narrative   Additional Social History:    Allergies:   Allergies  Allergen Reactions  . Bee Venom Anaphylaxis  . Demerol [Meperidine] Anaphylaxis  . Ivp Dye [Iodinated Diagnostic Agents] Anaphylaxis  . Shellfish Allergy Anaphylaxis  . Adhesive [Tape] Rash  . Iodine Rash  . Latex Rash    Labs:  Results for orders placed or performed during the hospital encounter of 12/12/15 (from the past 48 hour(s))  Comprehensive metabolic panel     Status: Abnormal   Collection Time: 12/12/15  1:05 PM  Result Value Ref Range   Sodium 135 135 - 145 mmol/L   Potassium 3.7 3.5 - 5.1 mmol/L   Chloride 103 101 - 111 mmol/L   CO2 22 22 - 32 mmol/L   Glucose, Bld 78 65 - 99 mg/dL   BUN 11 6 - 20 mg/dL   Creatinine, Ser 0.55 0.44 - 1.00 mg/dL   Calcium 10.2 8.9 - 10.3 mg/dL   Total Protein 8.4 (H) 6.5 - 8.1 g/dL   Albumin 5.4 (H) 3.5 - 5.0 g/dL   AST 31 15 - 41 U/L   ALT 12 (L) 14 - 54 U/L   Alkaline Phosphatase 32 (L) 38 - 126 U/L   Total Bilirubin 0.6 0.3 - 1.2 mg/dL   GFR calc non Af Amer >60 >60 mL/min   GFR calc Af Amer >60 >60 mL/min    Comment: (NOTE) The eGFR has been calculated using the CKD EPI equation. This calculation has not been validated in all clinical situations. eGFR's persistently <60 mL/min signify possible Chronic Kidney Disease.    Anion gap 10 5 - 15  Ethanol (ETOH)     Status: None   Collection Time: 12/12/15  1:05 PM  Result Value Ref Range   Alcohol, Ethyl (B) <5 <5 mg/dL    Comment:        LOWEST DETECTABLE LIMIT FOR SERUM ALCOHOL IS 5 mg/dL FOR MEDICAL PURPOSES ONLY   Salicylate level     Status: None   Collection Time: 12/12/15  1:05 PM  Result Value Ref Range   Salicylate Lvl <7.3 2.8 - 30.0 mg/dL  Acetaminophen level     Status: Abnormal   Collection Time: 12/12/15  1:05 PM  Result Value Ref  Range   Acetaminophen (Tylenol), Serum <10 (L) 10 - 30 ug/mL    Comment:        THERAPEUTIC CONCENTRATIONS VARY SIGNIFICANTLY. A RANGE OF 10-30 ug/mL MAY BE AN EFFECTIVE CONCENTRATION FOR MANY PATIENTS. HOWEVER, SOME ARE BEST TREATED AT CONCENTRATIONS OUTSIDE THIS RANGE. ACETAMINOPHEN CONCENTRATIONS >150 ug/mL AT 4 HOURS AFTER INGESTION AND >50 ug/mL AT 12 HOURS AFTER INGESTION ARE OFTEN ASSOCIATED WITH TOXIC REACTIONS.   CBC     Status: None   Collection Time: 12/12/15  1:05 PM  Result Value Ref Range   WBC 9.3 3.6 - 11.0 K/uL   RBC  4.32 3.80 - 5.20 MIL/uL   Hemoglobin 13.8 12.0 - 16.0 g/dL   HCT 39.7 35.0 - 47.0 %   MCV 91.7 80.0 - 100.0 fL   MCH 32.0 26.0 - 34.0 pg   MCHC 34.9 32.0 - 36.0 g/dL   RDW 12.4 11.5 - 14.5 %   Platelets 283 150 - 440 K/uL    No current facility-administered medications for this encounter.   Current Outpatient Prescriptions  Medication Sig Dispense Refill  . acetaminophen (TYLENOL) 325 MG tablet Take 2 tablets (650 mg total) by mouth every 6 (six) hours as needed for mild pain. 120 tablet 0  . amLODipine (NORVASC) 10 MG tablet Take 1 tablet (10 mg total) by mouth daily. 30 tablet 0  . busPIRone (BUSPAR) 15 MG tablet Take 1 tablet (15 mg total) by mouth 2 (two) times daily. 60 tablet 0  . carbidopa-levodopa (SINEMET IR) 25-100 MG per tablet Take 1 tablet by mouth 3 (three) times daily. 90 tablet 0  . citalopram (CELEXA) 40 MG tablet Take 1 tablet (40 mg total) by mouth daily. 30 tablet 0  . EPINEPHrine (EPIPEN 2-PAK) 0.3 mg/0.3 mL IJ SOAJ injection Inject 0.3 mLs (0.3 mg total) into the muscle once as needed (for severe allergic reaction). 1 Device 0  . estradiol (ESTRACE) 1 MG tablet Take 1 tablet (1 mg total) by mouth daily. Pt does not take on Wednesday and Saturday. 30 tablet 0  . fluticasone (FLONASE) 50 MCG/ACT nasal spray Place 2 sprays into both nostrils daily. 16 g 0  . glipiZIDE (GLUCOTROL) 5 MG tablet Take 1 tablet (5 mg total) by  mouth daily. 30 tablet 0  . LORazepam (ATIVAN) 1 MG tablet Take 1 tablet (1 mg total) by mouth every 6 (six) hours as needed (agitation). 6 tablet 0  . metFORMIN (GLUCOPHAGE) 1000 MG tablet Take 1 tablet (1,000 mg total) by mouth 2 (two) times daily with a meal. 60 tablet 0  . metoprolol succinate (TOPROL-XL) 50 MG 24 hr tablet Take 1 tablet (50 mg total) by mouth daily. 30 tablet 0  . Multiple Vitamin (THEREMS) TABS Take 1 tablet by mouth daily. 30 tablet 0  . naproxen (NAPROSYN) 375 MG tablet Take 375 mg by mouth 3 (three) times daily with meals.    . pravastatin (PRAVACHOL) 20 MG tablet Take 1 tablet (20 mg total) by mouth at bedtime. 30 tablet 0  . QUEtiapine (SEROQUEL) 50 MG tablet Take 3 tablets (150 mg total) by mouth at bedtime. 30 tablet 0  . tiZANidine (ZANAFLEX) 2 MG tablet Take 1 tablet (2 mg total) by mouth 2 (two) times daily. 60 tablet 0    Musculoskeletal: Strength & Muscle Tone: within normal limits Gait & Station: normal Patient leans: N/A  Psychiatric Specialty Exam: Review of Systems  Constitutional: Negative.   Eyes: Negative.   Respiratory: Negative.   Cardiovascular: Negative.   Gastrointestinal: Negative.   Musculoskeletal: Positive for neck pain.  Skin: Negative.   Neurological: Negative.   Psychiatric/Behavioral: Negative for depression, suicidal ideas, hallucinations, memory loss and substance abuse. The patient is not nervous/anxious and does not have insomnia.     Blood pressure 139/85, pulse 87, temperature 98.2 F (36.8 C), temperature source Oral, resp. rate 20, height 5' 6"  (1.676 m), weight 80.287 kg (177 lb), SpO2 95 %.Body mass index is 28.58 kg/(m^2).  General Appearance: Casual  Eye Contact::  Fair  Speech:  Slow  Volume:  Normal  Mood:  Euthymic  Affect:  Flat  Thought  Process:  Tangential  Orientation:  Full (Time, Place, and Person)  Thought Content:  Negative  Suicidal Thoughts:  No  Homicidal Thoughts:  No  Memory:  Immediate;    Good Recent;   Fair Remote;   Fair  Judgement:  Fair  Insight:  Fair  Psychomotor Activity:  Normal  Concentration:  Fair  Recall:  [employer] of Knowledge:Fair  Language: Fair  Akathisia:  No  Handed:  Right  AIMS (if indicated):     Assets:  Communication Skills Desire for Improvement Financial Resources/Insurance Housing Physical Health Resilience  ADL's:  Intact  Cognition: WNL  Sleep:      Treatment Plan Summary: Plan 65 year old woman with a history of unpleasant behavior but not violence. Brought back here voluntarily no commitment papers filed. I'm not even getting a second hand report of what the alleged problem was except from the patient who tends to blame everything on the staff at the group home. Patient is not currently psychotic nor is she dangerous to herself or others. She does not need inpatient psychiatric treatment. She Artie has outpatient treatment in place. Recommend that she be discharged back to her group home where they continue to work with her as a patient with chronic mental health issues and keep her involved in outpatient treatment while continuing her current medicine. Case reviewed with emergency room physician. No IVC in place. No change to medicine.  Disposition: Patient does not meet criteria for psychiatric inpatient admission.  Alethia Berthold, MD 12/12/2015 3:50 PM

## 2015-12-15 ENCOUNTER — Emergency Department (HOSPITAL_COMMUNITY)
Admission: EM | Admit: 2015-12-15 | Discharge: 2015-12-19 | Disposition: A | Discharge status: Other Acute Inpatient Hospital | Payer: Medicare Other | Attending: Emergency Medicine | Admitting: Emergency Medicine

## 2015-12-15 ENCOUNTER — Encounter (HOSPITAL_COMMUNITY): Payer: Self-pay | Admitting: Emergency Medicine

## 2015-12-15 DIAGNOSIS — Z79899 Other long term (current) drug therapy: Secondary | ICD-10-CM | POA: Diagnosis not present

## 2015-12-15 DIAGNOSIS — I10 Essential (primary) hypertension: Secondary | ICD-10-CM | POA: Insufficient documentation

## 2015-12-15 DIAGNOSIS — F312 Bipolar disorder, current episode manic severe with psychotic features: Secondary | ICD-10-CM | POA: Diagnosis present

## 2015-12-15 DIAGNOSIS — Z87891 Personal history of nicotine dependence: Secondary | ICD-10-CM | POA: Diagnosis not present

## 2015-12-15 DIAGNOSIS — F919 Conduct disorder, unspecified: Secondary | ICD-10-CM | POA: Insufficient documentation

## 2015-12-15 DIAGNOSIS — Z791 Long term (current) use of non-steroidal anti-inflammatories (NSAID): Secondary | ICD-10-CM | POA: Diagnosis not present

## 2015-12-15 DIAGNOSIS — E119 Type 2 diabetes mellitus without complications: Secondary | ICD-10-CM | POA: Diagnosis not present

## 2015-12-15 DIAGNOSIS — G20 Parkinson's disease: Secondary | ICD-10-CM | POA: Diagnosis not present

## 2015-12-15 DIAGNOSIS — F329 Major depressive disorder, single episode, unspecified: Secondary | ICD-10-CM | POA: Diagnosis not present

## 2015-12-15 DIAGNOSIS — Z7984 Long term (current) use of oral hypoglycemic drugs: Secondary | ICD-10-CM | POA: Diagnosis not present

## 2015-12-15 DIAGNOSIS — IMO0002 Reserved for concepts with insufficient information to code with codable children: Secondary | ICD-10-CM

## 2015-12-15 DIAGNOSIS — Z7951 Long term (current) use of inhaled steroids: Secondary | ICD-10-CM | POA: Diagnosis not present

## 2015-12-15 DIAGNOSIS — F3177 Bipolar disorder, in partial remission, most recent episode mixed: Secondary | ICD-10-CM | POA: Diagnosis present

## 2015-12-15 MED ORDER — CARBIDOPA-LEVODOPA 25-100 MG PO TABS
ORAL_TABLET | ORAL | Status: AC
  Filled 2015-12-15: qty 1

## 2015-12-15 MED ORDER — METFORMIN HCL 500 MG PO TABS
1000.0000 mg | ORAL_TABLET | Freq: Two times a day (BID) | ORAL | Status: DC
  Administered 2015-12-15 – 2015-12-19 (×8): 1000 mg via ORAL
  Filled 2015-12-15 (×8): qty 2

## 2015-12-15 MED ORDER — GLIPIZIDE 5 MG PO TABS
ORAL_TABLET | ORAL | Status: AC
  Filled 2015-12-15: qty 1

## 2015-12-15 MED ORDER — QUETIAPINE FUMARATE 100 MG PO TABS
150.0000 mg | ORAL_TABLET | Freq: Every day | ORAL | Status: DC
  Administered 2015-12-15 – 2015-12-18 (×4): 150 mg via ORAL
  Filled 2015-12-15 (×3): qty 2

## 2015-12-15 MED ORDER — PRAVASTATIN SODIUM 10 MG PO TABS
20.0000 mg | ORAL_TABLET | Freq: Every day | ORAL | Status: DC
  Administered 2015-12-15 – 2015-12-18 (×4): 20 mg via ORAL
  Filled 2015-12-15: qty 2
  Filled 2015-12-15: qty 1
  Filled 2015-12-15 (×3): qty 2

## 2015-12-15 MED ORDER — AMLODIPINE BESYLATE 5 MG PO TABS
10.0000 mg | ORAL_TABLET | Freq: Every day | ORAL | Status: DC
  Administered 2015-12-15 – 2015-12-19 (×5): 10 mg via ORAL
  Filled 2015-12-15 (×5): qty 2

## 2015-12-15 MED ORDER — METOPROLOL SUCCINATE ER 50 MG PO TB24
50.0000 mg | ORAL_TABLET | Freq: Every day | ORAL | Status: DC
  Administered 2015-12-16 – 2015-12-19 (×4): 50 mg via ORAL
  Filled 2015-12-15 (×5): qty 1

## 2015-12-15 MED ORDER — BUSPIRONE HCL 15 MG PO TABS
15.0000 mg | ORAL_TABLET | Freq: Two times a day (BID) | ORAL | Status: DC
  Administered 2015-12-15 – 2015-12-19 (×8): 15 mg via ORAL
  Filled 2015-12-15 (×10): qty 1

## 2015-12-15 MED ORDER — BUSPIRONE HCL 5 MG PO TABS
ORAL_TABLET | ORAL | Status: AC
  Filled 2015-12-15: qty 3

## 2015-12-15 MED ORDER — CITALOPRAM HYDROBROMIDE 20 MG PO TABS
40.0000 mg | ORAL_TABLET | Freq: Every day | ORAL | Status: DC
  Administered 2015-12-15 – 2015-12-19 (×5): 40 mg via ORAL
  Filled 2015-12-15 (×2): qty 1
  Filled 2015-12-15 (×4): qty 2

## 2015-12-15 MED ORDER — LORAZEPAM 1 MG PO TABS: 1.0000 mg | ORAL_TABLET | Freq: Four times a day (QID) | ORAL | Status: DC | PRN

## 2015-12-15 MED ORDER — GLIPIZIDE 5 MG PO TABS
5.0000 mg | ORAL_TABLET | Freq: Every day | ORAL | Status: DC
  Administered 2015-12-16: 5 mg via ORAL
  Filled 2015-12-15 (×3): qty 1

## 2015-12-15 MED ORDER — CARBIDOPA-LEVODOPA 25-100 MG PO TABS
1.0000 | ORAL_TABLET | Freq: Three times a day (TID) | ORAL | Status: DC
  Administered 2015-12-15 – 2015-12-19 (×12): 1 via ORAL
  Filled 2015-12-15 (×15): qty 1

## 2015-12-15 MED ORDER — CITALOPRAM HYDROBROMIDE 20 MG PO TABS
ORAL_TABLET | ORAL | Status: AC
  Filled 2015-12-15: qty 2

## 2015-12-15 MED ORDER — ACETAMINOPHEN 325 MG PO TABS
650.0000 mg | ORAL_TABLET | Freq: Four times a day (QID) | ORAL | Status: DC | PRN
Start: 2015-12-15 — End: 2015-12-19
  Administered 2015-12-16 – 2015-12-19 (×8): 650 mg via ORAL
  Filled 2015-12-15 (×8): qty 2

## 2015-12-15 MED ORDER — METOPROLOL SUCCINATE ER 50 MG PO TB24
ORAL_TABLET | ORAL | Status: AC
  Filled 2015-12-15: qty 1
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
## 2015-12-15 NOTE — ED Notes (Signed)
Kendra Nicholson from [facility] called, inquiring about getting an order for a TTS consult. I spoke with Dr. Eulis Foster who states she has been evaluated by mental health and he does not feel that is neccessary. I called Kendra Nicholson and informed him of the above. Gave him the opportunity to speak with Dr. Eulis Foster (who was available and willing to speak to him at the moment), himself if he would like, he declined.

## 2015-12-16 DIAGNOSIS — F919 Conduct disorder, unspecified: Secondary | ICD-10-CM | POA: Diagnosis not present

## 2015-12-16 LAB — CBG MONITORING, ED: Glucose-Capillary: 118 mg/dL — ABNORMAL HIGH (ref 65–99)

## 2015-12-16 MED ORDER — DIPHENHYDRAMINE HCL 25 MG PO CAPS
25.0000 mg | ORAL_CAPSULE | Freq: Once | ORAL | Status: AC
  Administered 2015-12-16: 25 mg via ORAL
  Filled 2015-12-16: qty 1

## 2015-12-16 MED ORDER — ACETAMINOPHEN 500 MG PO TABS
1000.0000 mg | ORAL_TABLET | Freq: Once | ORAL | Status: AC
  Administered 2015-12-16: 1000 mg via ORAL
  Filled 2015-12-16: qty 2

## 2015-12-16 NOTE — Clinical Social Work Placement (Signed)
   CLINICAL SOCIAL WORK PLACEMENT  NOTE  Date:  12/16/2015  Patient Details  Name: Kendra Nicholson MRN: [id number] Date of Birth: 1951/05/09  Clinical Social Work is seeking post-discharge placement for this patient at the [place] level of care (*CSW will initial, date and re-position this form in  chart as items are completed):  Yes   Patient/family provided with [place] Work Department's list of facilities offering this level of care within the geographic area requested by the patient (or if unable, by the patient's family).  Yes   Patient/family informed of their freedom to choose among providers that offer the needed level of care, that participate in Medicare, Medicaid or managed care program needed by the patient, have an available bed and are willing to accept the patient.  Yes   Patient/family informed of [facility]'s ownership interest in [facility] and [facility], as well as of the fact that they are under no obligation to receive care at these facilities.  PASRR submitted to EDS on       PASRR number received on       Existing PASRR number confirmed on 12/16/15     FL2 transmitted to all facilities in geographic area requested by pt/family on 12/16/15     FL2 transmitted to all facilities within larger geographic area on       Patient informed that his/her managed care company has contracts with or will negotiate with certain facilities, including the following:            Patient/family informed of bed offers received.  Patient chooses bed at       Physician recommends and patient chooses bed at      Patient to be transferred to   on  .  Patient to be transferred to facility by       Patient family notified on   of transfer.  Name of family member notified:        PHYSICIAN       Additional Comment:    _______________________________________________ Salome Arnt, [place] 12/16/2015, 10:35  AM [phone]

## 2015-12-16 NOTE — ED Notes (Signed)
Spoke with [place], [place]. States pt is unable to return to her group home, so placement at a different facility is necessary. Will update with any news.

## 2015-12-16 NOTE — ED Notes (Signed)
Pt given personal care items: toothbrush, comb, lotion, and deodorant per request.

## 2015-12-16 NOTE — ED Notes (Signed)
Pt given dinner meal tray

## 2015-12-16 NOTE — Clinical Social Work Note (Signed)
CSW notified Charisse at [facility] that pt cannot return to facility and she requested that Eugenio Saenz call [name] [phone], community support team lead. Voicemail left. Referral faxed to several facilities and follow up calls made.   Kendra Nicholson, [place]
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
## 2015-12-16 NOTE — ED Notes (Signed)
Pt requests that her daughter Werner Lean be updated.   [phone]

## 2015-12-16 NOTE — ED Notes (Signed)
Spoke with Moshe Salisbury from Social work. States she is attempting to get in touch with pt's group home[facility]. States she attempting to see if they will take pt back, but if they refuse, will have to find pt placement in another group home. Pt remains calm, pleasant, and cooperative at this time.

## 2015-12-16 NOTE — ED Notes (Signed)
Pt given breakfast meal tray. 

## 2015-12-16 NOTE — ED Notes (Signed)
Marcene Brawn, CSW at bedside.

## 2015-12-16 NOTE — ED Notes (Signed)
Pt ambulated to restroom without assistance at this time. Pt has no further requests at this time.
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
## 2015-12-17 DIAGNOSIS — F919 Conduct disorder, unspecified: Secondary | ICD-10-CM | POA: Diagnosis not present

## 2015-12-17 LAB — CBG MONITORING, ED
Glucose-Capillary: 201 mg/dL — ABNORMAL HIGH (ref 65–99)
Glucose-Capillary: 142 mg/dL — ABNORMAL HIGH (ref 65–99)

## 2015-12-17 NOTE — ED Provider Notes (Signed)
Pt stable and awaiting placement Apparently SW is actively seeking group home placement for this patient BP 159/90 mmHg  Pulse 96  Temp(Src) 98.3 F (36.8 C) (Oral)  Resp 16  Wt 80.287 kg  SpO2 98%   Ripley Fraise, MD 12/17/15 0800

## 2015-12-17 NOTE — ED Notes (Addendum)
Pt taken to shower under Security supervision.

## 2015-12-17 NOTE — ED Notes (Signed)
Pt given meal tray. nad noted.

## 2015-12-17 NOTE — ED Notes (Signed)
Received report on pt, pt resting, aroused when RN entered room, denies any complaints, pt updated on plan of care,

## 2015-12-17 NOTE — ED Notes (Signed)
Pt ambulated to restroom with steady and even gait. 

## 2015-12-17 NOTE — ED Notes (Signed)
Pt resting with eyes closed, appears to be in no distress. Respirations are even and unlabored.  
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
## 2015-12-17 NOTE — Clinical Social Work Note (Signed)
CSW continues bed search and currently awaiting return calls from several facilities for possible evaluation.  Kendra Nicholson, [name]

## 2015-12-18 ENCOUNTER — Encounter (HOSPITAL_COMMUNITY): Payer: Self-pay | Admitting: Emergency Medicine

## 2015-12-18 DIAGNOSIS — F919 Conduct disorder, unspecified: Secondary | ICD-10-CM | POA: Diagnosis not present

## 2015-12-18 LAB — CBG MONITORING, ED
Glucose-Capillary: 116 mg/dL — ABNORMAL HIGH (ref 65–99)
Glucose-Capillary: 205 mg/dL — ABNORMAL HIGH (ref 65–99)
Glucose-Capillary: 101 mg/dL — ABNORMAL HIGH (ref 65–99)

## 2015-12-18 MED ORDER — QUETIAPINE FUMARATE 100 MG PO TABS
ORAL_TABLET | ORAL | Status: AC
  Filled 2015-12-18: qty 2

## 2015-12-18 NOTE — ED Notes (Signed)
Talked to [place], Scientist, physiological over [employer] , [place] spoke on phone to pt and felt that she was not a good fit for the facility.

## 2015-12-18 NOTE — Clinical Social Work Note (Signed)
CSW has left two voicemails for care coordinator, Judson Roch requesting return call this morning. Several facilities are coming to assess pt today and are aware of pt's history. RN updated in ED as well as supervisor, Nathaniel Man. [facility] administrator, Peter Congo states she received call from care coordinator stating she had to take pt back and then not be in home until after court date. Peter Congo said she can not do this as this is her home as well. She indicates this was shared with care coordinator as well. Pt refuses to return to [facility]. CSW will continue seeking placement while awaiting care coordinator call.   Benay Pike, [place]

## 2015-12-18 NOTE — ED Notes (Signed)
CBG Result :[id number]

## 2015-12-18 NOTE — ED Notes (Signed)
Pt resting with eyes closed, resp even and non labored, no distress noted,  

## 2015-12-18 NOTE — ED Notes (Addendum)
Pt reports that she lives on her medicare, medicaid and disabillity due to her fibromyalgia and has many mental illness diagnosis including a diagnosis of PTSD, BiPolar which she disputes and others that she does not divulge. She reports no alternative plan should she be rejected by interviewing facilities. She reports that she has not worked since age 65. She has pressured speech, is defensive regarding why she will not accept placement, and has poor insight regarding her options- She reports to this RN that she does not know if she can reject interviewing facilities should they accept her. She reports day programs offered at one facililty are usually for developmentally delayed or clients with mental illness history. She denies she is Bipolar in spite of previous dx because she is not m a n i c as she spells it out. She stares intently when she speaks to this nurse.
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
## 2015-12-18 NOTE — ED Provider Notes (Signed)
Per nursing staff, the patient has continued to tolerate medications, and be fairly cooperative here. She has however, vetoed, 3 different possible group homes, and a fourth home is apparently going to see her.  She did not initially require psychiatric evaluation and was cleared on arrival, by me. Her behavior remains odd and she appears to be attempting to control the situation. It may be beneficial to assess her competency, to assist with placement. This may possibly be able to be done by psychiatry, by telemetry. I have asked the nursing staff to contact the psychiatry service in the morning to see if that is a possibility.  Daleen Bo, MD 12/18/15 [phone]

## 2015-12-18 NOTE — Clinical Social Work Note (Signed)
Pt has been assessed face-to-face by several facilities. No bed offers at this time. Care coordinator and ED updated.   Benay Pike, [place]

## 2015-12-18 NOTE — ED Notes (Signed)
Patient at nurses station saying she doesn't feel [facility] is a good fit for her. That it is a daycare and she has a college degree and she wasn't going there.

## 2015-12-18 NOTE — ED Notes (Signed)
Pt ambulatory to restroom, denies any complaints,

## 2015-12-18 NOTE — ED Notes (Signed)
Pt out of bed to bathroom- stopped in hall to converse with Animal nutritionist.

## 2015-12-18 NOTE — ED Notes (Signed)
Sitting in room watching TV

## 2015-12-18 NOTE — ED Notes (Signed)
Call from [place], [place] to inquire if other eval done- they have not

## 2015-12-18 NOTE — ED Notes (Signed)
Pt resting, resp even and non labored,

## 2015-12-18 NOTE — ED Notes (Signed)
Per Marcene Brawn, Education officer, museum, some one from [facility] is suppose to interview pt today. If they  accept patient [facility] social worker needs to be called so the appropriate  paper work can be done. Also, the EDP needs to put in a note in the chart stating patient has been cleared for any psych  Issues and discharge medications

## 2015-12-19 DIAGNOSIS — F3177 Bipolar disorder, in partial remission, most recent episode mixed: Secondary | ICD-10-CM | POA: Diagnosis present

## 2015-12-19 DIAGNOSIS — F312 Bipolar disorder, current episode manic severe with psychotic features: Secondary | ICD-10-CM | POA: Diagnosis not present

## 2015-12-19 DIAGNOSIS — F919 Conduct disorder, unspecified: Secondary | ICD-10-CM | POA: Diagnosis not present

## 2015-12-19 LAB — CBG MONITORING, ED: Glucose-Capillary: 120 mg/dL — ABNORMAL HIGH (ref 65–99)

## 2015-12-19 MED ORDER — CARBIDOPA-LEVODOPA 25-100 MG PO TABS: 1.0000 | ORAL_TABLET | Freq: Three times a day (TID) | ORAL | Status: AC

## 2015-12-19 MED ORDER — FLUTICASONE PROPIONATE 50 MCG/ACT NA SUSP: 2.0000 | Freq: Every day | NASAL | Status: DC

## 2015-12-19 MED ORDER — QUETIAPINE FUMARATE 50 MG PO TABS: ORAL_TABLET | ORAL | Status: DC

## 2015-12-19 MED ORDER — AMLODIPINE BESYLATE 10 MG PO TABS: 10.0000 mg | ORAL_TABLET | Freq: Every day | ORAL | Status: DC

## 2015-12-19 MED ORDER — GLIPIZIDE 5 MG PO TABS: 10.0000 mg | ORAL_TABLET | Freq: Every day | ORAL | Status: DC

## 2015-12-19 MED ORDER — TUBERCULIN PPD 5 UNIT/0.1ML ID SOLN
5.0000 [IU] | Freq: Once | INTRADERMAL | Status: DC
  Administered 2015-12-19: 5 [IU] via INTRADERMAL
  Filled 2015-12-19: qty 0.1

## 2015-12-19 MED ORDER — PRAVASTATIN SODIUM 20 MG PO TABS: ORAL_TABLET | ORAL | Status: DC

## 2015-12-19 MED ORDER — CITALOPRAM HYDROBROMIDE 40 MG PO TABS: 40.0000 mg | ORAL_TABLET | Freq: Every day | ORAL | Status: DC

## 2015-12-19 MED ORDER — METOPROLOL SUCCINATE ER 50 MG PO TB24: 50.0000 mg | ORAL_TABLET | Freq: Every day | ORAL | Status: DC

## 2015-12-19 MED ORDER — BUSPIRONE HCL 15 MG PO TABS: 15.0000 mg | ORAL_TABLET | Freq: Two times a day (BID) | ORAL | Status: DC

## 2015-12-19 MED ORDER — TIZANIDINE HCL 4 MG PO TABS: ORAL_TABLET | ORAL | Status: DC

## 2015-12-19 MED ORDER — METFORMIN HCL 1000 MG PO TABS: 1000.0000 mg | ORAL_TABLET | Freq: Two times a day (BID) | ORAL | Status: AC

## 2015-12-19 MED ORDER — NAPROXEN 375 MG PO TABS: 375.0000 mg | ORAL_TABLET | Freq: Three times a day (TID) | ORAL | Status: DC

## 2015-12-19 MED ORDER — ESTRADIOL 1 MG PO TABS: ORAL_TABLET | ORAL | Status: DC

## 2015-12-19 MED ORDER — THEREMS PO TABS: ORAL_TABLET | ORAL | Status: DC

## 2015-12-19 NOTE — ED Provider Notes (Signed)
Social worker is looking for  Another group home.  Behavior health states if the pt refuses to go to the the next group home found; then the pt can be discharged home.  Milton Ferguson, MD 12/19/15 1320

## 2015-12-19 NOTE — Clinical Social Work Note (Signed)
Pt assessed by and accepted at [facility] and she is agreeable. CSW notified Mariel Craft, care coordinator and pt's daughter, Roselyn Reef [phone]) at her request. Facility will pick up pt later this afternoon. TB skin test requested and facility will have read. Pt has capacity per psych evaluation.   Benay Pike, [place]

## 2015-12-19 NOTE — ED Notes (Signed)
Pt resting with eyes closed and even sonorous respirations
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
## 2015-12-19 NOTE — BH Assessment (Signed)
Per [employer]ina [employer], DNP - patient has capacity.  CSW will seek group home placement.

## 2015-12-19 NOTE — ED Notes (Signed)
Patient awake. CBG obtained. Breakfast tray provided and patient given morning Metformin. Alert/oriented. Asking to speak to Education officer, museum. Informed patient she will be speaking to psychiatry at some point today when they are available. Verbal understanding obtained. Patient eating breakfast upon RN departure from room.

## 2015-12-19 NOTE — ED Provider Notes (Signed)
Patient is allowed to do her own CBGs. Patient can give her own when necessary medicines. Patient can carry her own EpiPen with her.  Milton Ferguson, MD 12/19/15 1524

## 2015-12-19 NOTE — ED Notes (Addendum)
Late Entry: Discussion with Dr Eulis Foster to inquire of [facility] assessment would not be in order by psych to ascertain if pt should be evaluated for competency- Given her multiple psych diagnosis as well as her manipulation of her current situation if she is competent to be release into her own care. This action would be to protect both patient and staff should she be discharged and do something impulsive resulting in her harm, or an intentional action to evoke a legal response.Kendra Nicholson
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
## 2015-12-19 NOTE — ED Notes (Signed)
TB skin test administered into patients left forearm on 12/19/15. Positive bleb noted upon administering.

## 2015-12-19 NOTE — ED Notes (Signed)
Spoke with Meagan at [facility], who will check on  request for Psychiatry consult and keep [place] informed.

## 2015-12-19 NOTE — ED Notes (Signed)
CSW at bedside.

## 2015-12-19 NOTE — ED Notes (Signed)
acetaminophen (TYLENOL) 325 MG tablet Take 2 tablets (650 mg total) by mouth every 6 (six) hours as needed for mild pain. 120 tablet 0  . citalopram (CELEXA) 40 MG tablet Take 1 tablet (40 mg total) by mouth daily. 30 tablet 0  . EPINEPHrine (EPIPEN 2-PAK) 0.3 mg/0.3 mL IJ SOAJ injection Inject 0.3 mLs (0.3 mg total) into the muscle once as needed (for severe allergic reaction). 1 Device 0  . estradiol (ESTRACE) 1 MG tablet Take 1 tablet (1 mg total) by mouth daily. Pt does not take on Wednesday and Saturday. 30 tablet 0  . fluticasone (FLONASE) 50 MCG/ACT nasal spray Place 2 sprays into both nostrils daily. 16 g 0  . glipiZIDE (GLUCOTROL) 5 MG tablet Take 1 tablet (5 mg total) by mouth daily. 30 tablet 0  . LORazepam (ATIVAN) 1 MG tablet Take 1 tablet (1 mg total) by mouth every 6 (six) hours as needed (agitation). 6 tablet 0  . metFORMIN (GLUCOPHAGE) 1000 MG tablet Take 1 tablet (1,000 mg total) by mouth 2 (two) times daily with a meal. 60 tablet 0  . metoprolol succinate (TOPROL-XL) 50 MG 24 hr tablet Take 1 tablet (50 mg total) by mouth daily. 30 tablet 0  . Multiple Vitamin (THEREMS) TABS Take 1 tablet by mouth daily. 30 tablet 0  . naproxen (NAPROSYN) 375 MG tablet Take 375 mg by mouth 3 (three) times daily with meals.    . pravastatin (PRAVACHOL) 20 MG tablet Take 1 tablet (20 mg total) by mouth at bedtime. 30 tablet 0  . QUEtiapine (SEROQUEL) 50 MG tablet Take 3 tablets (150 mg total) by mouth at bedtime. 30 tablet 0  . rizatriptan (MAXALT) 5 MG tablet Take 5 mg by mouth as needed for migraine. May repeat in 2 hours if needed    . tiZANidine (ZANAFLEX) 2 MG tablet Take 1 tablet (2 mg total) by mouth 2 (two) times daily. (Patient taking differently: Take 4 mg by mouth 2 (two) times daily. ) 60 tablet 0  . amLODipine (NORVASC) 10 MG tablet Take 1 tablet (10 mg total) by mouth daily. 30 tablet 0  . busPIRone  (BUSPAR) 15 MG tablet Take 1 tablet (15 mg total) by mouth 2 (two) times daily. 60 tablet 0  . carbidopa-levodopa (SINEMET) 25-100 MG tablet Take 1 tablet by mouth 3 (three) times daily. 90 tablet 0

## 2015-12-19 NOTE — ED Notes (Signed)
Pt out of bed - asks permission to go to toilet. Again speaks of her many problems including sinus issues and jaw pain. She reports so many problems that she has had for so long. VS taken, warm blanket offered.
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
## 2015-12-19 NOTE — Discharge Instructions (Signed)
Patient to go to group home. Patient can do her own CBGs. Patient can give her on when necessary medicines. Patient can carry her EpiPen with her.

## 2015-12-19 NOTE — ED Provider Notes (Addendum)
acetaminophen (TYLENOL) 325 MG tablet Take 2 tablets (650 mg total) by mouth every 6 (six) hours as needed for mild pain. 120 tablet 0  . citalopram (CELEXA) 40 MG tablet Take 1 tablet (40 mg total) by mouth daily. 30 tablet 0  . EPINEPHrine (EPIPEN 2-PAK) 0.3 mg/0.3 mL IJ SOAJ injection Inject 0.3 mLs (0.3 mg total) into the muscle once as needed (for severe allergic reaction). 1 Device 0  . estradiol (ESTRACE) 1 MG tablet Take 1 tablet (1 mg total) by mouth daily. Pt does not take on Wednesday and Saturday. 30 tablet 0  . fluticasone (FLONASE) 50 MCG/ACT nasal spray Place 2 sprays into both nostrils daily. 16 g 0  . glipiZIDE (GLUCOTROL) 5 MG tablet Take 1 tablet (5 mg total) by mouth daily. 30 tablet 0  . LORazepam (ATIVAN) 1 MG tablet Take 1 tablet (1 mg total) by mouth every 6 (six) hours as needed (agitation). 6 tablet 0  . metFORMIN (GLUCOPHAGE) 1000 MG tablet Take 1 tablet (1,000 mg total) by mouth 2 (two) times daily with a meal. 60 tablet 0  . metoprolol succinate (TOPROL-XL) 50 MG 24 hr tablet Take 1 tablet (50 mg total) by mouth daily. 30 tablet 0  . Multiple Vitamin (THEREMS) TABS Take 1 tablet by mouth daily. 30 tablet 0  . naproxen (NAPROSYN) 375 MG tablet Take 375 mg by mouth 3 (three) times daily with meals.    . pravastatin (PRAVACHOL) 20 MG tablet Take 1 tablet (20 mg total) by mouth at bedtime. 30 tablet 0  . QUEtiapine (SEROQUEL) 50 MG tablet Take 3 tablets (150 mg total) by mouth at bedtime. 30 tablet 0  . rizatriptan (MAXALT) 5 MG tablet Take 5 mg by mouth as needed for migraine. May repeat in 2 hours if needed    . tiZANidine (ZANAFLEX) 2 MG tablet Take 1 tablet (2 mg total) by mouth 2 (two) times daily. (Patient taking differently: Take 4 mg by mouth 2 (two) times daily. ) 60 tablet 0  . amLODipine (NORVASC) 10 MG tablet Take  1 tablet (10 mg total) by mouth daily. 30 tablet 0  . busPIRone (BUSPAR) 15 MG tablet Take 1 tablet (15 mg total) by mouth 2 (two) times daily. 60 tablet 0  . carbidopa-levodopa (SINEMET) 25-100 MG tablet Take 1 tablet by mouth 3 (three) times daily. 90 tablet 0               Milton Ferguson, MD 12/19/15 1534  Milton Ferguson, MD 12/19/15 [phone]

## 2015-12-19 NOTE — ED Notes (Signed)
[place] at bedside.
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
## 2015-12-19 NOTE — ED Notes (Signed)
Social Work speaking with patient.

## 2016-01-16 ENCOUNTER — Emergency Department (HOSPITAL_COMMUNITY)
Admission: EM | Admit: 2016-01-16 | Discharge: 2016-01-16 | Disposition: A | Discharge status: Home / Self Care | Payer: Medicare Other | Attending: Emergency Medicine | Admitting: Emergency Medicine

## 2016-01-16 ENCOUNTER — Encounter (HOSPITAL_COMMUNITY): Payer: Self-pay | Admitting: *Deleted

## 2016-01-16 DIAGNOSIS — F919 Conduct disorder, unspecified: Secondary | ICD-10-CM | POA: Diagnosis present

## 2016-01-16 DIAGNOSIS — Y92009 Unspecified place in unspecified non-institutional (private) residence as the place of occurrence of the external cause: Secondary | ICD-10-CM | POA: Insufficient documentation

## 2016-01-16 DIAGNOSIS — R21 Rash and other nonspecific skin eruption: Secondary | ICD-10-CM | POA: Insufficient documentation

## 2016-01-16 DIAGNOSIS — E119 Type 2 diabetes mellitus without complications: Secondary | ICD-10-CM | POA: Diagnosis not present

## 2016-01-16 DIAGNOSIS — Y999 Unspecified external cause status: Secondary | ICD-10-CM | POA: Insufficient documentation

## 2016-01-16 DIAGNOSIS — IMO0002 Reserved for concepts with insufficient information to code with codable children: Secondary | ICD-10-CM

## 2016-01-16 DIAGNOSIS — Y939 Activity, unspecified: Secondary | ICD-10-CM | POA: Insufficient documentation

## 2016-01-16 DIAGNOSIS — Z91040 Latex allergy status: Secondary | ICD-10-CM | POA: Insufficient documentation

## 2016-01-16 DIAGNOSIS — Z87891 Personal history of nicotine dependence: Secondary | ICD-10-CM | POA: Insufficient documentation

## 2016-01-16 DIAGNOSIS — I10 Essential (primary) hypertension: Secondary | ICD-10-CM | POA: Insufficient documentation

## 2016-01-16 DIAGNOSIS — G20 Parkinson's disease: Secondary | ICD-10-CM | POA: Insufficient documentation

## 2016-01-16 LAB — CBG MONITORING, ED
Glucose-Capillary: 57 mg/dL — ABNORMAL LOW (ref 65–99)
Glucose-Capillary: 80 mg/dL (ref 65–99)

## 2016-01-16 MED ORDER — PRAVASTATIN SODIUM 20 MG PO TABS: 20.0000 mg | ORAL_TABLET | Freq: Every day | ORAL | Status: DC

## 2016-01-16 MED ORDER — HYDROXYZINE HCL 25 MG PO TABS
25.0000 mg | ORAL_TABLET | Freq: Once | ORAL | Status: AC
  Administered 2016-01-16: 25 mg via ORAL
  Filled 2016-01-16: qty 1

## 2016-01-16 MED ORDER — LORAZEPAM 1 MG PO TABS: 1.0000 mg | ORAL_TABLET | Freq: Four times a day (QID) | ORAL | Status: DC | PRN

## 2016-01-16 MED ORDER — CARBIDOPA-LEVODOPA 25-100 MG PO TABS: 1.0000 | ORAL_TABLET | Freq: Three times a day (TID) | ORAL | Status: DC

## 2016-01-16 MED ORDER — TIZANIDINE HCL 2 MG PO TABS: 2.0000 mg | ORAL_TABLET | Freq: Three times a day (TID) | ORAL | Status: DC | PRN

## 2016-01-16 MED ORDER — QUETIAPINE FUMARATE 50 MG PO TABS: 150.0000 mg | ORAL_TABLET | Freq: Every day | ORAL | Status: DC

## 2016-01-16 MED ORDER — INSULIN ASPART 100 UNIT/ML ~~LOC~~ SOLN: 0.0000 [IU] | Freq: Three times a day (TID) | SUBCUTANEOUS | Status: DC

## 2016-01-16 MED ORDER — METFORMIN HCL 500 MG PO TABS: 1000.0000 mg | ORAL_TABLET | Freq: Two times a day (BID) | ORAL | Status: DC

## 2016-01-16 MED ORDER — CITALOPRAM HYDROBROMIDE 40 MG PO TABS: 40.0000 mg | ORAL_TABLET | Freq: Every day | ORAL | Status: DC

## 2016-01-16 MED ORDER — BUSPIRONE HCL 10 MG PO TABS: 15.0000 mg | ORAL_TABLET | Freq: Two times a day (BID) | ORAL | Status: DC

## 2016-01-16 MED ORDER — ACETAMINOPHEN 325 MG PO TABS: 650.0000 mg | ORAL_TABLET | Freq: Four times a day (QID) | ORAL | Status: DC | PRN

## 2016-01-16 MED ORDER — ACETAMINOPHEN 325 MG PO TABS
650.0000 mg | ORAL_TABLET | Freq: Once | ORAL | Status: AC
  Administered 2016-01-16: 650 mg via ORAL
  Filled 2016-01-16: qty 2

## 2016-01-16 MED ORDER — AMLODIPINE BESYLATE 10 MG PO TABS: 10.0000 mg | ORAL_TABLET | Freq: Every day | ORAL | Status: DC

## 2016-01-16 MED ORDER — GLIPIZIDE 10 MG PO TABS: 10.0000 mg | ORAL_TABLET | Freq: Every day | ORAL | Status: DC

## 2016-01-16 MED ORDER — METOPROLOL SUCCINATE ER 50 MG PO TB24: 50.0000 mg | ORAL_TABLET | Freq: Every day | ORAL | Status: DC
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
## 2016-01-16 NOTE — Progress Notes (Signed)
EDCM questioned by [facility] regarding placement for patient.  EDCM discussed with EDSW who will speak to patient.
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
## 2016-02-19 ENCOUNTER — Emergency Department (HOSPITAL_COMMUNITY)
Admission: EM | Admit: 2016-02-19 | Discharge: 2016-02-19 | Discharge status: Against Medical Advice | Payer: Medicare Other | Attending: Emergency Medicine | Admitting: Emergency Medicine

## 2016-02-19 ENCOUNTER — Encounter (HOSPITAL_COMMUNITY): Payer: Self-pay | Admitting: Nurse Practitioner

## 2016-02-19 DIAGNOSIS — Z87891 Personal history of nicotine dependence: Secondary | ICD-10-CM | POA: Diagnosis not present

## 2016-02-19 DIAGNOSIS — G20 Parkinson's disease: Secondary | ICD-10-CM | POA: Insufficient documentation

## 2016-02-19 DIAGNOSIS — F3177 Bipolar disorder, in partial remission, most recent episode mixed: Secondary | ICD-10-CM | POA: Diagnosis not present

## 2016-02-19 DIAGNOSIS — Z79899 Other long term (current) drug therapy: Secondary | ICD-10-CM | POA: Diagnosis not present

## 2016-02-19 DIAGNOSIS — E119 Type 2 diabetes mellitus without complications: Secondary | ICD-10-CM | POA: Insufficient documentation

## 2016-02-19 DIAGNOSIS — I10 Essential (primary) hypertension: Secondary | ICD-10-CM | POA: Insufficient documentation

## 2016-02-19 DIAGNOSIS — R451 Restlessness and agitation: Secondary | ICD-10-CM | POA: Diagnosis present

## 2016-02-19 DIAGNOSIS — Z7984 Long term (current) use of oral hypoglycemic drugs: Secondary | ICD-10-CM | POA: Insufficient documentation

## 2016-02-19 DIAGNOSIS — Z91040 Latex allergy status: Secondary | ICD-10-CM | POA: Diagnosis not present

## 2016-02-19 LAB — COMPREHENSIVE METABOLIC PANEL
ALT: 38 U/L (ref 14–54)
AST: 33 U/L (ref 15–41)
Albumin: 4.5 g/dL (ref 3.5–5.0)
Alkaline Phosphatase: 45 U/L (ref 38–126)
Anion gap: 10 (ref 5–15)
BUN: 6 mg/dL (ref 6–20)
CO2: 23 mmol/L (ref 22–32)
Calcium: 9.7 mg/dL (ref 8.9–10.3)
Chloride: 106 mmol/L (ref 101–111)
Creatinine, Ser: 0.67 mg/dL (ref 0.44–1.00)
GFR calc Af Amer: >60 mL/min (ref >60)
GFR calc non Af Amer: >60 mL/min (ref >60)
Glucose, Bld: 163 mg/dL — ABNORMAL HIGH (ref 65–99)
Potassium: 3.6 mmol/L (ref 3.5–5.1)
Sodium: 139 mmol/L (ref 135–145)
Total Bilirubin: 0.6 mg/dL (ref 0.3–1.2)
Total Protein: 7.5 g/dL (ref 6.5–8.1)

## 2016-02-19 LAB — CBC
HCT: 39.4 % (ref 36.0–46.0)
Hemoglobin: 13 g/dL (ref 12.0–15.0)
MCH: 30.7 pg (ref 26.0–34.0)
MCHC: 33 g/dL (ref 30.0–36.0)
MCV: 93.1 fL (ref 78.0–100.0)
Platelets: 233 10*3/uL (ref 150–400)
RBC: 4.23 MIL/uL (ref 3.87–5.11)
RDW: 12.5 % (ref 11.5–15.5)
WBC: 5.3 10*3/uL (ref 4.0–10.5)

## 2016-02-19 NOTE — Progress Notes (Signed)
Cm attempted to speak with patient to provide resources and discharge plan options but unable to speak with her as she adamantly declined to talk to CM and insisted on leaving the premises without resources and information.
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
## 2016-02-19 NOTE — ED Notes (Signed)
Patient came out of room in hospital gown and in slippers yelling out loud  - per staff  Patient left AMA
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
## 2016-02-20 ENCOUNTER — Encounter (HOSPITAL_COMMUNITY): Payer: Self-pay | Admitting: Emergency Medicine

## 2016-02-20 ENCOUNTER — Emergency Department (HOSPITAL_COMMUNITY)
Admission: EM | Admit: 2016-02-20 | Discharge: 2016-02-21 | Disposition: A | Discharge status: Home / Self Care | Payer: Medicare Other | Attending: Emergency Medicine | Admitting: Emergency Medicine

## 2016-02-20 DIAGNOSIS — Z79899 Other long term (current) drug therapy: Secondary | ICD-10-CM | POA: Diagnosis not present

## 2016-02-20 DIAGNOSIS — F329 Major depressive disorder, single episode, unspecified: Secondary | ICD-10-CM | POA: Insufficient documentation

## 2016-02-20 DIAGNOSIS — Z91040 Latex allergy status: Secondary | ICD-10-CM | POA: Diagnosis not present

## 2016-02-20 DIAGNOSIS — I10 Essential (primary) hypertension: Secondary | ICD-10-CM | POA: Diagnosis not present

## 2016-02-20 DIAGNOSIS — R45851 Suicidal ideations: Secondary | ICD-10-CM

## 2016-02-20 DIAGNOSIS — Z87891 Personal history of nicotine dependence: Secondary | ICD-10-CM | POA: Diagnosis not present

## 2016-02-20 DIAGNOSIS — F3177 Bipolar disorder, in partial remission, most recent episode mixed: Secondary | ICD-10-CM | POA: Diagnosis not present

## 2016-02-20 DIAGNOSIS — G20 Parkinson's disease: Secondary | ICD-10-CM | POA: Insufficient documentation

## 2016-02-20 DIAGNOSIS — E119 Type 2 diabetes mellitus without complications: Secondary | ICD-10-CM | POA: Diagnosis not present

## 2016-02-20 DIAGNOSIS — Z7984 Long term (current) use of oral hypoglycemic drugs: Secondary | ICD-10-CM | POA: Diagnosis not present

## 2016-02-20 DIAGNOSIS — Z590 Homelessness: Secondary | ICD-10-CM | POA: Diagnosis present

## 2016-02-20 LAB — COMPREHENSIVE METABOLIC PANEL
ALT: 13 U/L — ABNORMAL LOW (ref 14–54)
AST: 28 U/L (ref 15–41)
Albumin: 4.2 g/dL (ref 3.5–5.0)
Alkaline Phosphatase: 43 U/L (ref 38–126)
Anion gap: 7 (ref 5–15)
BUN: 9 mg/dL (ref 6–20)
CO2: 24 mmol/L (ref 22–32)
Calcium: 9.6 mg/dL (ref 8.9–10.3)
Chloride: 106 mmol/L (ref 101–111)
Creatinine, Ser: 0.68 mg/dL (ref 0.44–1.00)
GFR calc Af Amer: >60 mL/min (ref >60)
GFR calc non Af Amer: >60 mL/min (ref >60)
Glucose, Bld: 128 mg/dL — ABNORMAL HIGH (ref 65–99)
Potassium: 3.3 mmol/L — ABNORMAL LOW (ref 3.5–5.1)
Sodium: 137 mmol/L (ref 135–145)
Total Bilirubin: 0.7 mg/dL (ref 0.3–1.2)
Total Protein: 7.1 g/dL (ref 6.5–8.1)

## 2016-02-20 LAB — CBC WITH DIFFERENTIAL/PLATELET
Basophils Absolute: 0 10*3/uL (ref 0.0–0.1)
Basophils Relative: 0 %
Eosinophils Absolute: 0.1 10*3/uL (ref 0.0–0.7)
Eosinophils Relative: 2 %
HCT: 38.1 % (ref 36.0–46.0)
Hemoglobin: 12.3 g/dL (ref 12.0–15.0)
Lymphocytes Relative: 49 %
Lymphs Abs: 2.7 10*3/uL (ref 0.7–4.0)
MCH: 29.6 pg (ref 26.0–34.0)
MCHC: 32.3 g/dL (ref 30.0–36.0)
MCV: 91.8 fL (ref 78.0–100.0)
Monocytes Absolute: 0.5 10*3/uL (ref 0.1–1.0)
Monocytes Relative: 10 %
Neutro Abs: 2.2 10*3/uL (ref 1.7–7.7)
Neutrophils Relative %: 39 %
Platelets: 267 10*3/uL (ref 150–400)
RBC: 4.15 MIL/uL (ref 3.87–5.11)
RDW: 12.7 % (ref 11.5–15.5)
WBC: 5.5 10*3/uL (ref 4.0–10.5)

## 2016-02-20 LAB — ETHANOL: Alcohol, Ethyl (B): <5 mg/dL (ref <5)

## 2016-02-20 LAB — CBG MONITORING, ED: Glucose-Capillary: 193 mg/dL — ABNORMAL HIGH (ref 65–99)

## 2016-02-20 MED ORDER — AMLODIPINE BESYLATE 5 MG PO TABS
10.0000 mg | ORAL_TABLET | Freq: Every day | ORAL | Status: DC
  Administered 2016-02-20 – 2016-02-21 (×2): 10 mg via ORAL
  Filled 2016-02-20 (×2): qty 2

## 2016-02-20 MED ORDER — BUSPIRONE HCL 10 MG PO TABS
15.0000 mg | ORAL_TABLET | Freq: Two times a day (BID) | ORAL | Status: DC
  Administered 2016-02-20 – 2016-02-21 (×3): 15 mg via ORAL
  Filled 2016-02-20 (×3): qty 2

## 2016-02-20 MED ORDER — METOPROLOL SUCCINATE ER 25 MG PO TB24
50.0000 mg | ORAL_TABLET | Freq: Every day | ORAL | Status: DC
  Administered 2016-02-20 – 2016-02-21 (×2): 50 mg via ORAL
  Filled 2016-02-20 (×2): qty 2

## 2016-02-20 MED ORDER — METFORMIN HCL 500 MG PO TABS
1000.0000 mg | ORAL_TABLET | Freq: Two times a day (BID) | ORAL | Status: DC
  Administered 2016-02-20 – 2016-02-21 (×3): 1000 mg via ORAL
  Filled 2016-02-20 (×3): qty 2

## 2016-02-20 MED ORDER — PRAVASTATIN SODIUM 40 MG PO TABS
20.0000 mg | ORAL_TABLET | Freq: Every evening | ORAL | Status: DC
  Administered 2016-02-20: 20 mg via ORAL
  Filled 2016-02-20: qty 1

## 2016-02-20 MED ORDER — LORAZEPAM 1 MG PO TABS: 1.0000 mg | ORAL_TABLET | Freq: Four times a day (QID) | ORAL | Status: DC | PRN

## 2016-02-20 MED ORDER — CARBIDOPA-LEVODOPA 25-100 MG PO TABS
1.0000 | ORAL_TABLET | Freq: Three times a day (TID) | ORAL | Status: DC
  Administered 2016-02-20 – 2016-02-21 (×3): 1 via ORAL
  Filled 2016-02-20 (×3): qty 1

## 2016-02-20 MED ORDER — ONDANSETRON HCL 4 MG PO TABS: 4.0000 mg | ORAL_TABLET | Freq: Three times a day (TID) | ORAL | Status: DC | PRN

## 2016-02-20 MED ORDER — QUETIAPINE FUMARATE 25 MG PO TABS
150.0000 mg | ORAL_TABLET | Freq: Every day | ORAL | Status: DC
Start: 2016-02-20 — End: 2016-02-21
  Administered 2016-02-20 – 2016-02-21 (×2): 150 mg via ORAL
  Filled 2016-02-20 (×2): qty 6

## 2016-02-20 MED ORDER — CITALOPRAM HYDROBROMIDE 10 MG PO TABS
40.0000 mg | ORAL_TABLET | Freq: Every day | ORAL | Status: DC
  Administered 2016-02-20 – 2016-02-21 (×2): 40 mg via ORAL
  Filled 2016-02-20 (×2): qty 4

## 2016-02-20 MED ORDER — ACETAMINOPHEN 325 MG PO TABS: 650.0000 mg | ORAL_TABLET | ORAL | Status: DC | PRN

## 2016-02-20 MED ORDER — GLIPIZIDE 10 MG PO TABS
10.0000 mg | ORAL_TABLET | Freq: Every day | ORAL | Status: DC
  Administered 2016-02-20 – 2016-02-21 (×2): 10 mg via ORAL
  Filled 2016-02-20 (×2): qty 1
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
## 2016-02-20 NOTE — ED Notes (Addendum)
Snack and drink given to patient. A regular diet ordered for patient for dinner.

## 2016-02-20 NOTE — ED Notes (Signed)
Family at bedside. 

## 2016-02-20 NOTE — Discharge Planning (Signed)
[facility] consulted to assist with placement.  [facility] consulted EDSW; pt is from [facility].
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
## 2016-02-20 NOTE — ED Notes (Addendum)
Pt screamed at RN when the RN asked pt to please not take off her BP cuff. RN asked pt to please not by ugly and the pt continued to scream at RN. Pt states if she is d/c she will kill herself, bc she is not going to live in a shelter.

## 2016-02-20 NOTE — Care Management (Signed)
ED CM spoke with Otila Kluver in [facility] states, a  face to face Psych eval cannot be performed until tomorrow, she will  update Dr. Winfred Leeds.Marland Kitchen

## 2016-02-20 NOTE — ED Notes (Addendum)
Pt states she just wants to sleep now b/c she wasn't able to sleep at all last night and she's exhausted. Pt ao x 4.

## 2016-02-20 NOTE — ED Notes (Signed)
Placed pt in wine colored scrubs and blue socks. All of pt's belongings were given to family members (at pt's bedside). Sitter is at pt's bedside, also.
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
## 2016-02-20 NOTE — ED Notes (Signed)
Case Manager was talking with patient.
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
## 2016-02-20 NOTE — ED Notes (Signed)
Patient talking with TTS,at this time.
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
## 2016-02-21 ENCOUNTER — Other Ambulatory Visit: Payer: Self-pay

## 2016-02-21 DIAGNOSIS — F329 Major depressive disorder, single episode, unspecified: Secondary | ICD-10-CM | POA: Diagnosis not present

## 2016-02-21 LAB — RAPID URINE DRUG SCREEN, HOSP PERFORMED
Amphetamines: NOT DETECTED
Barbiturates: NOT DETECTED
Benzodiazepines: NOT DETECTED
Cocaine: NOT DETECTED
Opiates: NOT DETECTED
Tetrahydrocannabinol: NOT DETECTED

## 2016-02-21 LAB — CBG MONITORING, ED: Glucose-Capillary: 226 mg/dL — ABNORMAL HIGH (ref 65–99)

## 2016-02-21 MED ORDER — TERBINAFINE HCL 1 % EX CREA: 1.0000 "application " | TOPICAL_CREAM | Freq: Two times a day (BID) | CUTANEOUS | Status: DC

## 2016-02-21 NOTE — ED Notes (Addendum)
Patient CBG was 226, the Nurse was informed.

## 2016-02-21 NOTE — ED Notes (Signed)
Patient was given a snack and drink. 

## 2016-02-21 NOTE — Discharge Instructions (Signed)
Suicidal Feelings: How to Help Yourself °Suicide is the taking of one's own life. If you feel as though life is getting too tough to handle and are thinking about suicide, get help right away. To get help: °· Call your local emergency services (911 in the U.S.). °· Call a suicide hotline to speak with a trained counselor who understands how you are feeling. The following is a list of suicide hotlines in the [place]. For a list of hotlines in [place], visit www.suicide.org/hotlines/international/[place]-suicide-hotlines.html. °¨  1-800-273-TALK (1-800-273-8255). °¨  1-800-SUICIDE (1-800-784-2433). °¨  1-888-628-9454. This is a hotline for Spanish speakers. °¨  1-800-799-4TTY (1-800-799-4889). This is a hotline for TTY users. °¨  1-866-4-U-TREVOR (1-866-488-7386). This is a hotline for lesbian, gay, bisexual, transgender, or questioning youth. °· Contact a crisis center or a local suicide prevention center. To find a crisis center or suicide prevention center: °¨ Call your local hospital, clinic, community service organization, mental health center, social service provider, or health department. Ask for assistance in connecting to a crisis center. °¨ Visit www.suicidepreventionlifeline.org/getinvolved/locator for a list of crisis centers in the [place], or visit www.suicideprevention.ca/thinking-about-suicide/find-a-crisis-centre for a list of centers in [place]. °· Visit the following websites: °¨  National Suicide Prevention Lifeline: www.suicidepreventionlifeline.org °¨  Hopeline: www.hopeline.com °¨  American Foundation for Suicide Prevention: www.afsp.org °¨  The Trevor Project (for lesbian, gay, bisexual, transgender, or questioning youth): www.thetrevorproject.org °HOW CAN I HELP MYSELF FEEL BETTER? °· Promise yourself that you will not do anything drastic when you have suicidal feelings. Remember, there is hope. Many people have gotten through suicidal thoughts and feelings, and you will, too. You may  have gotten through them before, and this proves that you can get through them again. °· Let family, friends, teachers, or counselors know how you are feeling. Try not to isolate yourself from those who care about you. Remember, they will want to help you. Talk with someone every day, even if you do not feel sociable. Face-to-face conversation is best. °· Call a mental health professional and see one regularly. °· Visit your primary health care provider every year. °· Eat a well-balanced diet, and space your meals so you eat regularly. °· Get plenty of rest. °· Avoid alcohol and drugs, and remove them from your home. They will only make you feel worse. °· If you are thinking of taking a lot of medicine, give your medicine to someone who can give it to you one day at a time. If you are on antidepressants and are concerned you will overdose, let your health care provider know so he or she can give you safer medicines. Ask your mental health professional about the possible side effects of any medicines you are taking. °· Remove weapons, poisons, knives, and anything else that could harm you from your home. °· Try to stick to routines. Follow a schedule every day. Put self-care on your schedule. °· Make a list of realistic goals, and cross them off when you achieve them. Accomplishments give a sense of worth. °· Wait until you are feeling better before doing the things you find difficult or unpleasant. °· Exercise if you are able. You will feel better if you exercise for even a half hour each day. °· Go out in the sun or into nature. This will help you recover from depression faster. If you have a favorite place to walk, go there. °· Do the things that have always given you pleasure. Play your favorite music, read a good book, paint a picture, play your favorite instrument, or do anything   else that takes your mind off your depression if it is safe to do.  Keep your living space well lit.  When you are feeling well,  write yourself a letter about tips and support that you can read when you are not feeling well.  Remember that life's difficulties can be sorted out with help. Conditions can be treated. You can work on thoughts and strategies that serve you well.   This information is not intended to replace advice given to you by your health care provider. Make sure you discuss any questions you have with your health care provider.   Document Released: 02/21/2003 Document Revised: 09/07/2014 Document Reviewed: 12/12/2013 Elsevier Interactive Patient Education 2016 [employer]. Substance Abuse Treatment Programs  Intensive Outpatient Programs [facility] Services     [address], [place]       [facility] [place] [place], [place]  [facility]     (Inpatient and outpatient)     [address]           [place] [phone] (Suboxone and Methadone)  [place], [place] [address]      [place] [address] [id number] [place], [place]  [facility] (Outpatient/Inpatient, [place])    (insurance only) [phone]             [facility] ([place]) [place], [place]     [facility]     [address]     [place], [place]       [facility] (for caregivers and family) [phone] [address] Kristeen Mans. [place], [place]      Residential Treatment Programs [facility]      [address] [place], [place] [address]  [phone]       [address] [place], [place] [address] [phone]  [facility] [place]        [phone]       [facility] [phone]  [facility] ([place].)             [place], [place] or [place] of [place], [address] [phone]  [facility]    [address]      [place], [place]       [facility] [address] [place], [place]  [place]  [address]     [place], [place] [address]     [phone]      Admissions: 8am-3pm M-F  [facility] Services (RTS) [place], [place]  [facility] Program: Residential Program [phone] Days)   [place], [place] or [phone]     [facility]: [place], [place] (Walk in Hours over the weekend or by referral)  [facility] [place], [place], [place] [address] [phone]  Crisis Mobile: [facility]:  [phone] (for crisis response 24 hours a day) [facility] Hotline:      [phone] Outpatient Psychiatry and Counseling  [facility]: Mobile Crisis Management 24 hours:  [phone]  [facility] [employer] sliding scale fee and walk in schedule: M-F 8am-12pm/1pm-3pm [place], [place] [address] [place], [place] [address] [phone]  [facility] (Formerly known as [facility])- new patient walk-in appointments available Monday - Friday 8am -3pm.          [address] [place], [place] [address] [phone] or crisis line- [facility]/ Intensive Outpatient Therapy Program [place], [place] [address] [place]      [phone] [address] [place], [place] [address]                 [place]    [facility] [phone]. [place], [place] [address]   [employer] of Care          [address] Johnette Abraham  [place], [place] [address]       [phone]  [facility] [address] [place], [place] [address] [phone]  [facility]    [address] [place], [place] [address]     [place], [place] Joycelyn Man     Center [place] [address]     [phone]       [facility] [place] [address]  [facility]     [address], [place], [place], [place] [address] [place]     [address]     [place], [place] [address]     [phone]       [facility] [address] [place], [place] [address] [phone] Resources for Temporary Residential Assistance/Crisis [place] [facility]) M-F 8am-3pm   [address] [place], [place] [address]   [phone] Services include: laundry, barbering, support groups, case management, phone  & computer access, showers, AA/NA mtgs, mental health/substance abuse nurse, job skills class, disability information, VA assistance, spiritual classes, etc.   [facility]   [address], [place]     [place]  [employer] (women and children)       [address]. [place], [place] [address] [phone] [email or web address] for application and process Application Required  [facility]   [address]    [place] [address]     [phone]                    [place], [place]  [id number] application appt.) Application Required  [facility] (women only)    [address]     [place], [place] [address]     [phone]      Intake starts 6pm daily Need valid ID, SSC, & Police report [employer] [address] [place], [place] [phone] Application Required  [employer] (men only)     [place].      [place], [place]       [place] (Pregnant women only) [address] [place], [place]  [facility]      [place] [address].      [place], [place] [address]     [phone]             [facility] [address] [place] [address] [place], [place] 90 day commitment/SA/Application process  [facility](men only)     [address]     [place], [place]       Check-in at [facility] [address] [place], [place] [address] [phone] Men/Women/Women and Children must be there by 7 pm  [place], [place]

## 2016-02-21 NOTE — ED Notes (Signed)
Patient has a urine cup at bedside. Patient is aware we need a ua.

## 2016-02-21 NOTE — ED Notes (Signed)
Son in Chief Operating Officer updated on plan of care for patient to be discharged home. He plans to bring patient her personal belongings before discharge. Also notified patients daughter in [place] of same.
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
## 2016-02-21 NOTE — ED Provider Notes (Signed)
Patient is a 65 year old female who was evaluated for suicidal ideation and homelessness. She has been evaluated by TTS and social work. They have offered multiple dispositions for her, however she reports that she does not want to do any of them. She is not happy with any of the living arrangements that have been made for her in the past. Apparently she has made arrangements with her family to go to [place] to some sort of a group home. She will be discharged in the company of her family.  Veryl Speak, MD 02/21/16 1130
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
## 2017-07-14 ENCOUNTER — Other Ambulatory Visit: Payer: Self-pay | Admitting: Family Medicine

## 2017-07-14 DIAGNOSIS — Z1231 Encounter for screening mammogram for malignant neoplasm of breast: Secondary | ICD-10-CM

## 2017-07-30 ENCOUNTER — Encounter: Payer: Self-pay | Admitting: General Surgery

## 2017-08-05 ENCOUNTER — Telehealth: Payer: Self-pay | Admitting: General Surgery

## 2017-08-05 NOTE — Telephone Encounter (Signed)
I HAVE LEFT A MESSAGE FOR HOLLIE THE POA OF PATIENT.THE PATIENT HAS AN APPOINTMENT ON 08-09-17 WITH DR Jamal Collin AND WE NEED HER PERMISSION TO SEE THE PATIENT.SHE WILL NEED TO SIGN FOR THE PATIENT TO BE SEEN OR WE WILL NEED TO RESCHEDULE PATIENT.WE WILL ALSO NEED A NUMBER WHERE WE CAN REACH HER ON THE DAY OF THE VISIT INCASE  SURGERY IS NECESSARY.

## 2017-08-06 ENCOUNTER — Ambulatory Visit
Admission: RE | Admit: 2017-08-06 | Discharge: 2017-08-06 | Disposition: A | Discharge status: Home / Self Care | Payer: Medicare Other | Source: Ambulatory Visit | Attending: Family Medicine | Admitting: Family Medicine

## 2017-08-06 DIAGNOSIS — Z1231 Encounter for screening mammogram for malignant neoplasm of breast: Secondary | ICD-10-CM | POA: Insufficient documentation

## 2017-08-10 ENCOUNTER — Ambulatory Visit: Payer: Self-pay | Admitting: General Surgery

## 2017-08-16 ENCOUNTER — Telehealth: Payer: Self-pay | Admitting: General Surgery

## 2017-08-16 ENCOUNTER — Encounter: Payer: Self-pay | Admitting: General Surgery

## 2017-08-16 ENCOUNTER — Ambulatory Visit (INDEPENDENT_AMBULATORY_CARE_PROVIDER_SITE_OTHER): Payer: Medicare Other | Admitting: General Surgery

## 2017-08-16 VITALS — BP 130/72 | HR 72 | Resp 14 | Ht 66.0 in | Wt 156.0 lb

## 2017-08-16 DIAGNOSIS — K648 Other hemorrhoids: Secondary | ICD-10-CM | POA: Diagnosis not present

## 2017-08-16 NOTE — Telephone Encounter (Signed)
I HAVE LEFT A MESSAGE FOR TODD (POA) TO CALL BACK. I NEED THEIR MAILING ADDRESS TO ADD AS GUARANTOR & TO MAIL NEW PATIENT PW TO COMPLETE FOR PATIENT.

## 2017-08-16 NOTE — Progress Notes (Signed)
Patient ID: Kendra Nicholson, female   DOB: 06/04/1951, 66 y.o.   MRN: [id number]  Chief Complaint  Patient presents with  . Hemorrhoids    HPI Kendra Nicholson is a 66 y.o. female here today for a evaluation of bleeding hemorrhoids. Patient state she has noticed some bright red blood on the paper. She states no pain or constipation. Moves her bowels daily.  Has had colonoscopies in the past and small internal hemorrhoids were noted the last time HPI  Past Medical History:  Diagnosis Date  . Anxiety   . Depression   . Diabetes mellitus without complication ([place])   . Hypertension   . Parkinson's disease ([place])   . PTSD (post-traumatic stress disorder)   . Sleep apnea     Past Surgical History:  Procedure Laterality Date  . ABDOMINAL HYSTERECTOMY    . ABDOMINAL SURGERY      Family History  Problem Relation Age of Onset  . Breast cancer Maternal Grandmother     Social History Social History   Tobacco Use  . Smoking status: Former Smoker  Substance Use Topics  . Alcohol use: No  . Drug use: No    Allergies  Allergen Reactions  . Bee Venom Anaphylaxis  . Demerol [Meperidine] Anaphylaxis  . Ivp Dye [Iodinated Diagnostic Agents] Anaphylaxis  . Shellfish Allergy Anaphylaxis  . Adhesive [Tape] Rash  . Iodine Rash  . Latex Rash    Current Outpatient Medications  Medication Sig Dispense Refill  . acetaminophen (TYLENOL) 325 MG tablet Take 2 tablets (650 mg total) by mouth every 6 (six) hours as needed for mild pain. 120 tablet 0  . carbidopa-levodopa (SINEMET) 25-100 MG tablet Take 1 tablet by mouth 3 (three) times daily. 90 tablet 0  . docusate sodium (COLACE) 100 MG capsule Take 100 mg by mouth 2 (two) times daily.    Marland Kitchen EPINEPHrine (EPIPEN 2-PAK) 0.3 mg/0.3 mL IJ SOAJ injection Inject 0.3 mLs (0.3 mg total) into the muscle once as needed (for severe allergic reaction). 1 Device 0  . linaclotide (LINZESS) 72 MCG capsule Take 72 mcg by mouth daily before breakfast.    .  metFORMIN (GLUCOPHAGE) 1000 MG tablet Take 1 tablet (1,000 mg total) by mouth 2 (two) times daily. 30 tablet 0  . metoprolol succinate (TOPROL-XL) 50 MG 24 hr tablet Take 1 tablet (50 mg total) by mouth daily. 30 tablet 0  . Multiple Vitamin (THEREMS) TABS Take one tablet a day 30 tablet 0  . OLANZapine (ZYPREXA) 2.5 MG tablet Take 2.5 mg by mouth at bedtime.     No current facility-administered medications for this visit.     Review of Systems Review of Systems  Constitutional: Negative.   Respiratory: Negative.   Cardiovascular: Negative.     Blood pressure 130/72, pulse 72, resp. rate 14, height 5\' 6"  (1.676 m), weight 156 lb (70.8 kg).  Physical Exam Physical Exam  Eyes: Conjunctivae are normal. No scleral icterus.  Neck: Normal range of motion.  Cardiovascular: Normal rate, regular rhythm and normal heart sounds.  Pulmonary/Chest: Effort normal and breath sounds normal.  Abdominal: Soft. Normal appearance.  Genitourinary: Rectal exam shows external hemorrhoid. Rectal exam shows no fissure, no mass, no tenderness and anal tone normal.  Genitourinary Comments: 1 cm skin tag  12:00 location.    Lymphadenopathy:    She has no cervical adenopathy.  Neurological: She is alert.  Skin: Skin is warm and dry.  Psychiatric: She has a normal mood and affect.  With consent anoscopy was completed. Few internal hemorrhoids identified which are very small not inflamed or otherwise complicated.  No bleeding noted.  Lower rectal mucosa appears normal Data Reviewed Notes reviewed   Assessment    Small external hemorrhoidal tag of skin.  Multiple small internal hemorrhoids noted-none more than a centimeter in size, no active bleeding or other complications from this     Plan    Continue proctosol or hemorrhoid creme.  No need for any intervention at this time  If bleeding worsens or other symptoms develop return for reevaluation.  Patient advised fully    HPI, Physical Exam,  Assessment and Plan have been scribed under the direction and in the presence of Mckinley Jewel, MD  Gaspar Cola, CMA\  I have completed the exam and reviewed the above documentation for accuracy and completeness.  I agree with the above.  Haematologist has been used and any errors in dictation or transcription are unintentional.  Seeplaputhur G. Jamal Collin, M.D., F.A.C.S.    Junie Panning G 08/16/2017, 1:40 PM

## 2017-08-16 NOTE — Patient Instructions (Signed)
Continue proctosol or hemorrhoid creme. Follow up as needed.

## 2017-10-01 ENCOUNTER — Encounter: Payer: Self-pay | Admitting: Emergency Medicine

## 2017-10-01 ENCOUNTER — Emergency Department
Admission: EM | Admit: 2017-10-01 | Discharge: 2017-10-01 | Disposition: A | Discharge status: Home / Self Care | Payer: Medicare Other | Attending: Emergency Medicine | Admitting: Emergency Medicine

## 2017-10-01 ENCOUNTER — Emergency Department: Payer: Medicare Other

## 2017-10-01 ENCOUNTER — Other Ambulatory Visit: Payer: Self-pay

## 2017-10-01 DIAGNOSIS — Z79899 Other long term (current) drug therapy: Secondary | ICD-10-CM | POA: Diagnosis not present

## 2017-10-01 DIAGNOSIS — Z7984 Long term (current) use of oral hypoglycemic drugs: Secondary | ICD-10-CM | POA: Insufficient documentation

## 2017-10-01 DIAGNOSIS — Z87891 Personal history of nicotine dependence: Secondary | ICD-10-CM | POA: Insufficient documentation

## 2017-10-01 DIAGNOSIS — R4182 Altered mental status, unspecified: Secondary | ICD-10-CM | POA: Diagnosis present

## 2017-10-01 DIAGNOSIS — G20 Parkinson's disease: Secondary | ICD-10-CM | POA: Diagnosis not present

## 2017-10-01 DIAGNOSIS — I10 Essential (primary) hypertension: Secondary | ICD-10-CM | POA: Insufficient documentation

## 2017-10-01 DIAGNOSIS — Z91040 Latex allergy status: Secondary | ICD-10-CM | POA: Diagnosis not present

## 2017-10-01 DIAGNOSIS — E119 Type 2 diabetes mellitus without complications: Secondary | ICD-10-CM | POA: Insufficient documentation

## 2017-10-01 DIAGNOSIS — R410 Disorientation, unspecified: Secondary | ICD-10-CM | POA: Insufficient documentation

## 2017-10-01 LAB — COMPREHENSIVE METABOLIC PANEL
ALT: 11 U/L — ABNORMAL LOW (ref 14–54)
AST: 19 U/L (ref 15–41)
Albumin: 4.2 g/dL (ref 3.5–5.0)
Alkaline Phosphatase: 40 U/L (ref 38–126)
Anion gap: 12 (ref 5–15)
BUN: 9 mg/dL (ref 6–20)
CO2: 22 mmol/L (ref 22–32)
Calcium: 9.7 mg/dL (ref 8.9–10.3)
Chloride: 106 mmol/L (ref 101–111)
Creatinine, Ser: 0.64 mg/dL (ref 0.44–1.00)
GFR calc Af Amer: >60 mL/min (ref >60)
GFR calc non Af Amer: >60 mL/min (ref >60)
Glucose, Bld: 140 mg/dL — ABNORMAL HIGH (ref 65–99)
Potassium: 3.3 mmol/L — ABNORMAL LOW (ref 3.5–5.1)
Sodium: 140 mmol/L (ref 135–145)
Total Bilirubin: 0.5 mg/dL (ref 0.3–1.2)
Total Protein: 7.1 g/dL (ref 6.5–8.1)

## 2017-10-01 LAB — URINALYSIS, COMPLETE (UACMP) WITH MICROSCOPIC
Bacteria, UA: NONE SEEN
Bilirubin Urine: NEGATIVE
Glucose, UA: 50 mg/dL — AB
Hgb urine dipstick: NEGATIVE
Ketones, ur: NEGATIVE mg/dL
Leukocytes, UA: NEGATIVE
Nitrite: NEGATIVE
Protein, ur: NEGATIVE mg/dL
RBC / HPF: NONE SEEN RBC/hpf (ref 0–5)
Specific Gravity, Urine: 1.012 (ref 1.005–1.030)
pH: 5 (ref 5.0–8.0)

## 2017-10-01 LAB — CBC
HCT: 35.4 % (ref 35.0–47.0)
Hemoglobin: 12 g/dL (ref 12.0–16.0)
MCH: 31.7 pg (ref 26.0–34.0)
MCHC: 33.7 g/dL (ref 32.0–36.0)
MCV: 93.9 fL (ref 80.0–100.0)
Platelets: 193 10*3/uL (ref 150–440)
RBC: 3.77 MIL/uL — ABNORMAL LOW (ref 3.80–5.20)
RDW: 12.9 % (ref 11.5–14.5)
WBC: 5.2 10*3/uL (ref 3.6–11.0)

## 2017-10-01 LAB — LITHIUM LEVEL: Lithium Lvl: 0.45 mmol/L — ABNORMAL LOW (ref 0.60–1.20)

## 2017-10-01 LAB — GLUCOSE, CAPILLARY: Glucose-Capillary: 114 mg/dL — ABNORMAL HIGH (ref 65–99)

## 2017-10-01 NOTE — ED Notes (Signed)
Patient's legal guardian updated on treatment status prior to DC.

## 2017-10-01 NOTE — ED Provider Notes (Signed)
-----------------------------------------   5:14 PM on 10/01/2017 -----------------------------------------   Blood pressure (!) 131/59, pulse 77, temperature 99 F (37.2 C), temperature source Oral, resp. rate 17, height 5\' 6"  (1.676 m), weight 70.8 kg (156 lb), SpO2 98 %.  Assuming care from Dr. Bronwen Betters of Kendra Nicholson is a 67 y.o. female with a chief complaint of Altered Mental Status .    Please refer to H&P by previous MD for further details.  The current plan of care is to pending lithium level to rule out toxicity. Lithium level is low at 0.45 which per patient it always runs low. Remaining of her labs are within normal limits. Patient is ambulatory and back to baseline. Guardian will be contacted for discharge. Patient stable for discharge with follow-up with primary care doctor. Return precautions discussed with patient.        Kendra Nicholson, [place], MD 10/01/17 [phone]

## 2017-10-01 NOTE — ED Notes (Signed)
Acute onset of confusion and altered mental status that began yesterday. Episodes of urinary incontinence since yesterday. Pt alert to self, place, disoriented to situation and time. Caregiver at bedside.

## 2017-10-01 NOTE — ED Notes (Signed)
Report to David, RN.

## 2017-10-01 NOTE — ED Triage Notes (Signed)
Pt in via POV from [place]; representative from group home states, "she just isnt acting right today, she is walking bent over and has been confused, I think she may have a UTI."  Representative also reports episode of incontinence today.  Pt A/Ox3, vitals WDL, NAD noted at this time.

## 2017-10-01 NOTE — ED Provider Notes (Addendum)
[facility] Emergency Department Provider Note  ____________________________________________   I have reviewed the triage vital signs and the nursing notes. Where available I have reviewed prior notes and, if possible and indicated, outside hospital notes.    HISTORY  Chief Complaint Altered Mental Status    HPI Kendra Nicholson is a 67 y.o. female who presents today with no complaints of her own.  According to the caretaker who is with her, she was confused but she is now at her baseline.  She did not have any falls or trauma.  There is no focal numbness or weakness, she does seem to be making less sense temporarily.  She has no complaints or issues at this time.  Mostly we see her for bipolar disorder and dissociation with that however she denies SI or HI.   Past Medical History:  Diagnosis Date  . Anxiety   . Depression   . Diabetes mellitus without complication ([place])   . Hypertension   . Parkinson's disease ([place])   . PTSD (post-traumatic stress disorder)   . Sleep apnea     Patient Active Problem List   Diagnosis Date Noted  . Bipolar 1 disorder, mixed, partial remission ([place]) 12/19/2015  . Personality disorder ([place]) 12/12/2015  . PTSD (post-traumatic stress disorder) 05/09/2015  . Hypertension 05/09/2015  . Diabetes ([place]) 05/09/2015    Past Surgical History:  Procedure Laterality Date  . ABDOMINAL HYSTERECTOMY    . ABDOMINAL SURGERY      Prior to Admission medications   Medication Sig Start Date End Date Taking? Authorizing Provider  acetaminophen (TYLENOL) 325 MG tablet Take 2 tablets (650 mg total) by mouth every 6 (six) hours as needed for mild pain. Patient taking differently: Take 650 mg by mouth 3 (three) times daily.  05/22/15  Yes Pucilowska, Jolanta B, MD  amLODipine (NORVASC) 5 MG tablet Take 5 mg by mouth daily.   Yes [provider]  aspirin EC 81 MG tablet Take 81 mg by mouth daily.   Yes [provider]   atorvastatin (LIPITOR) 40 MG tablet Take 40 mg by mouth daily.   Yes [provider]  busPIRone (BUSPAR) 10 MG tablet Take 20 mg by mouth 2 (two) times daily.   Yes [provider]  carbidopa-levodopa (SINEMET) 25-100 MG tablet Take 1 tablet by mouth 3 (three) times daily. 12/19/15  Yes Milton Ferguson, MD  citalopram (CELEXA) 40 MG tablet Take 40 mg by mouth daily.   Yes [provider]  cyanocobalamin 1000 MCG tablet Take 1,000 mcg by mouth daily.   Yes [provider]  docusate sodium (COLACE) 100 MG capsule Take 100 mg by mouth 2 (two) times daily.   Yes [provider]  EPINEPHrine (EPIPEN 2-PAK) 0.3 mg/0.3 mL IJ SOAJ injection Inject 0.3 mLs (0.3 mg total) into the muscle once as needed (for severe allergic reaction). 05/22/15  Yes Pucilowska, Jolanta B, MD  fluticasone (FLONASE) 50 MCG/ACT nasal spray Place 1 spray into both nostrils every evening.   Yes [provider]  linaclotide (LINZESS) 72 MCG capsule Take 72 mcg by mouth daily before breakfast.   Yes [provider]  lithium 300 MG tablet Take 300 mg by mouth 2 (two) times daily.   Yes [provider]  Menthol-Methyl Salicylate (BEN GAY GREASELESS) 10-15 % greaseless cream Apply 1 application topically every morning.   Yes [provider]  metFORMIN (GLUCOPHAGE) 1000 MG tablet Take 1 tablet (1,000 mg total) by mouth 2 (  two) times daily. 12/19/15  Yes Milton Ferguson, MD  metoprolol succinate (TOPROL-XL) 50 MG 24 hr tablet Take 1 tablet (50 mg total) by mouth daily. 12/19/15  Yes Milton Ferguson, MD  Multiple Vitamin [facility]) TABS Take one tablet a day Patient taking differently: Take 1 tablet by mouth daily. Take one tablet a day 12/19/15  Yes Milton Ferguson, MD  OLANZAPINE PO Take 5-7.5 mg by mouth 2 (two) times daily. Take 5MG  by mouth every morning and 7.5MG  by mouth every night at bedtime   Yes [provider]  polyethylene glycol (MIRALAX /  GLYCOLAX) packet Take 17 g by mouth daily.   Yes [provider]  SELENIUM SULFIDE EX Apply 1 application topically every morning.   Yes [provider]    Allergies Bee venom; Demerol [meperidine]; Ivp dye [iodinated diagnostic agents]; Shellfish allergy; Adhesive [tape]; Iodine; and Latex  Family History  Problem Relation Age of Onset  . Breast cancer Maternal Grandmother     Social History Social History   Tobacco Use  . Smoking status: Former Research scientist (life sciences)  . Smokeless tobacco: Never Used  Substance Use Topics  . Alcohol use: No  . Drug use: No    Review of Systems Constitutional: No fever/chills Eyes: No visual changes. ENT: No sore throat. No stiff neck no neck pain Cardiovascular: Denies chest pain. Respiratory: Denies shortness of breath. Gastrointestinal:   no vomiting.  No diarrhea.  No constipation. Genitourinary: Negative for dysuria. Musculoskeletal: Negative lower extremity swelling Skin: Negative for rash. Neurological: Negative for severe headaches, focal weakness or numbness.   ____________________________________________   PHYSICAL EXAM:  VITAL SIGNS: ED Triage Vitals  Enc Vitals Group     BP 10/01/17 1112 (!) 147/71     Pulse Rate 10/01/17 1112 79     Resp 10/01/17 1112 16     Temp 10/01/17 1112 99.3 F (37.4 C)     Temp Source 10/01/17 1112 Oral     SpO2 10/01/17 1112 98 %     Weight 10/01/17 1113 156 lb (70.8 kg)     Height 10/01/17 1113 5\' 6"  (1.676 m)     Head Circumference --      Peak Flow --      Pain Score --      Pain Loc --      Pain Edu? --      Excl. in [place]? --     Constitutional: Alert and oriented name and place unsure of the exact date at baseline per staff well appearing and in no acute distress. Eyes: Conjunctivae are normal Head: Atraumatic HEENT: No congestion/rhinnorhea. Mucous membranes are moist.  Oropharynx non-erythematous Neck:   Nontender with no meningismus, no masses, no stridor Cardiovascular:  Normal rate, regular rhythm. Grossly normal heart sounds.  Good peripheral circulation. Respiratory: Normal respiratory effort.  No retractions. Lungs CTAB. Abdominal: Soft and nontender. No distention. No guarding no rebound Back:  There is no focal tenderness or step off.  there is no midline tenderness there are no lesions noted. there is no CVA tenderness Musculoskeletal: No lower extremity tenderness, no upper extremity tenderness. No joint effusions, no DVT signs strong distal pulses no edema Neurologic:  Normal speech and language. No gross focal neurologic deficits are appreciated.  Skin:  Skin is warm, dry and intact. No rash noted. Psychiatric: Mood and affect are normal. Speech and behavior are normal.  ____________________________________________   LABS (all labs ordered are listed, but only abnormal results are displayed)  Labs Reviewed  COMPREHENSIVE METABOLIC PANEL - Abnormal; Notable for the following components:      Result Value   Potassium 3.3 (*)    Glucose, Bld 140 (*)    ALT 11 (*)    All other components within normal limits  CBC - Abnormal; Notable for the following components:   RBC 3.77 (*)    All other components within normal limits  URINALYSIS, COMPLETE (UACMP) WITH MICROSCOPIC - Abnormal; Notable for the following components:   Color, Urine YELLOW (*)    APPearance CLEAR (*)    Glucose, UA 50 (*)    Squamous Epithelial / LPF 0-5 (*)    All other components within normal limits    Pertinent labs  results that were available during my care of the patient were reviewed by me and considered in my medical decision making (see chart for details). ____________________________________________  EKG  I personally interpreted any EKGs ordered by me or triage  ____________________________________________  RADIOLOGY  Pertinent labs & imaging results that were available during my care of the patient were reviewed by me and considered in my medical decision  making (see chart for details). If possible, patient and/or family made aware of any abnormal findings.  Dg Chest 2 View  Result Date: 10/01/2017 CLINICAL DATA:  Cough EXAM: CHEST  2 VIEW COMPARISON:  None. FINDINGS: Hypoinspiratory radiographs. Normal heart size. Normal mediastinal contour. No pneumothorax. No pleural effusion. Lungs appear clear, with no acute consolidative airspace disease and no pulmonary edema. IMPRESSION: No active cardiopulmonary disease. Electronically Signed   By: Ilona Sorrel M.D.   On: 10/01/2017 14:51   Ct Head Wo Contrast  Result Date: 10/01/2017 CLINICAL DATA:  Triage note: Pt in via POV from [facility]; representative from group home states, "she just isn't acting right today, she is walking bent over and has been confused, I think she may have a UTI." EXAM: CT HEAD WITHOUT CONTRAST TECHNIQUE: Contiguous axial images were obtained from the base of the skull through the vertex without intravenous contrast. COMPARISON:  07/17/2011, MRI 02/16/2012 FINDINGS: Brain: There is significant central and cortical atrophy for age. Periventricular white matter changes are consistent with small vessel disease. There is no intra or extra-axial fluid collection or mass lesion. The basilar cisterns and ventricles have a normal appearance. There is no CT evidence for acute infarction or hemorrhage. Vascular: There is atherosclerotic calcification of the internal carotid arteries. Skull: Normal. Negative for fracture or focal lesion. Sinuses/Orbits: Minimal mucosal thickening of the paranasal sinuses. Mastoid air cells and orbits are unremarkable. Other: None IMPRESSION: 1.  No evidence for acute intracranial abnormality. 2. Significant atrophy for age. 3. Changes of small vessel disease. Electronically Signed   By: Nolon Nations M.D.   On: 10/01/2017 15:10   ____________________________________________    PROCEDURES  Procedure(s) performed: None  Procedures  Critical  Care performed: None  ____________________________________________   INITIAL IMPRESSION / ASSESSMENT AND PLAN / ED COURSE  Pertinent labs & imaging results that were available during my care of the patient were reviewed by me and considered in my medical decision making (see chart for details).  She was apparently confused earlier, the certainly could be a diabetic issue although her blood glucose here is normal, there is no evidence of acute stroke, CT head chest x-ray urinalysis, CMP was CBC and blood work are all quite reassuring, she is at her baseline and has been observed here for some time with no issue.  We will recheck her sugar  and make sure she is able to ambulate and eat and drink and if she can we will try to get her safely home.  Extensive return precautions and follow-up given and understood..  Patient lithium level will also be checked prior to discharge.  I did call the number on call for next of kin , Her daughter, went straight to a voicemail with no identifying names.  She is here with representatives of the group home.  ----------------------------------------- 3:47 PM on 10/01/2017 -----------------------------------------  lithiumlevel is pending prior to discharge. Dr. Alfred Levins will follow.   ____________________________________________   FINAL CLINICAL IMPRESSION(S) / ED DIAGNOSES  Final diagnoses:  None      This chart was dictated using voice recognition software.  Despite best efforts to proofread,  errors can occur which can change meaning.      Schuyler Amor, MD 10/01/17 1538    Schuyler Amor, MD 10/01/17 1541    Schuyler Amor, MD 10/01/17 [phone]

## 2017-10-01 NOTE — ED Notes (Signed)
The patient was walked outside her room for about 20 feet. Denies any dizziness or lightheadedness. She did use a walker that she say she also uses at home. Spo2 started at 100% decreased to 95%. Patient has no complaints.

## 2017-10-01 NOTE — Discharge Instructions (Signed)
Return to the emergency room for any new or worrisome symptoms including confusion fever vomiting or anything else of concern.  Follow closely with your primary care doctor as soon as possible.

## 2017-10-04 DIAGNOSIS — G43909 Migraine, unspecified, not intractable, without status migrainosus: Secondary | ICD-10-CM | POA: Insufficient documentation

## 2017-10-04 DIAGNOSIS — E119 Type 2 diabetes mellitus without complications: Secondary | ICD-10-CM | POA: Insufficient documentation

## 2017-10-04 DIAGNOSIS — M797 Fibromyalgia: Secondary | ICD-10-CM | POA: Insufficient documentation

## 2017-10-04 DIAGNOSIS — R410 Disorientation, unspecified: Secondary | ICD-10-CM | POA: Insufficient documentation

## 2017-10-04 DIAGNOSIS — M1990 Unspecified osteoarthritis, unspecified site: Secondary | ICD-10-CM | POA: Insufficient documentation

## 2017-10-12 DIAGNOSIS — G9340 Encephalopathy, unspecified: Secondary | ICD-10-CM | POA: Insufficient documentation

## 2017-10-22 DIAGNOSIS — F05 Delirium due to known physiological condition: Secondary | ICD-10-CM | POA: Insufficient documentation

## 2017-11-02 ENCOUNTER — Ambulatory Visit: Admission: RE | Admit: 2017-11-02 | Payer: Medicare Other | Source: Ambulatory Visit | Admitting: Gastroenterology

## 2017-11-02 ENCOUNTER — Encounter: Admission: RE | Payer: Self-pay | Source: Ambulatory Visit

## 2017-11-02 HISTORY — DX: Polyp of colon: K63.5

## 2017-11-02 HISTORY — DX: Fibromyalgia: M79.7

## 2017-11-02 HISTORY — DX: Other seasonal allergic rhinitis: J30.2

## 2017-11-02 HISTORY — DX: Headache, unspecified: R51.9

## 2017-11-02 HISTORY — DX: Psoriasis, unspecified: L40.9

## 2017-11-02 HISTORY — DX: Hyperlipidemia, unspecified: E78.5

## 2017-11-02 HISTORY — DX: Unspecified osteoarthritis, unspecified site: M19.90

## 2017-11-02 HISTORY — DX: Headache: R51

## 2017-11-02 SURGERY — COLONOSCOPY WITH PROPOFOL
Anesthesia: General

## 2017-11-03 ENCOUNTER — Encounter: Payer: Self-pay | Admitting: Anesthesiology

## 2017-11-27 DIAGNOSIS — R45851 Suicidal ideations: Secondary | ICD-10-CM | POA: Insufficient documentation

## 2018-07-06 ENCOUNTER — Other Ambulatory Visit: Payer: Self-pay | Admitting: Family Medicine

## 2018-07-06 DIAGNOSIS — Z1231 Encounter for screening mammogram for malignant neoplasm of breast: Secondary | ICD-10-CM

## 2018-08-10 ENCOUNTER — Ambulatory Visit
Admission: RE | Admit: 2018-08-10 | Discharge: 2018-08-10 | Disposition: A | Discharge status: Home / Self Care | Payer: Medicare Other | Source: Ambulatory Visit | Attending: Family Medicine | Admitting: Family Medicine

## 2018-08-10 DIAGNOSIS — Z1231 Encounter for screening mammogram for malignant neoplasm of breast: Secondary | ICD-10-CM | POA: Insufficient documentation

## 2018-12-30 ENCOUNTER — Emergency Department
Admission: EM | Admit: 2018-12-30 | Discharge: 2018-12-30 | Disposition: A | Discharge status: Home / Self Care | Payer: Medicare Other | Attending: Emergency Medicine | Admitting: Emergency Medicine

## 2018-12-30 ENCOUNTER — Other Ambulatory Visit: Payer: Self-pay

## 2018-12-30 ENCOUNTER — Encounter: Payer: Self-pay | Admitting: Emergency Medicine

## 2018-12-30 DIAGNOSIS — I10 Essential (primary) hypertension: Secondary | ICD-10-CM | POA: Diagnosis not present

## 2018-12-30 DIAGNOSIS — F3132 Bipolar disorder, current episode depressed, moderate: Secondary | ICD-10-CM | POA: Diagnosis not present

## 2018-12-30 DIAGNOSIS — G20 Parkinson's disease: Secondary | ICD-10-CM | POA: Diagnosis not present

## 2018-12-30 DIAGNOSIS — F4310 Post-traumatic stress disorder, unspecified: Secondary | ICD-10-CM | POA: Diagnosis present

## 2018-12-30 DIAGNOSIS — Z79899 Other long term (current) drug therapy: Secondary | ICD-10-CM | POA: Insufficient documentation

## 2018-12-30 DIAGNOSIS — Z87891 Personal history of nicotine dependence: Secondary | ICD-10-CM | POA: Insufficient documentation

## 2018-12-30 DIAGNOSIS — E119 Type 2 diabetes mellitus without complications: Secondary | ICD-10-CM | POA: Insufficient documentation

## 2018-12-30 DIAGNOSIS — Z7982 Long term (current) use of aspirin: Secondary | ICD-10-CM | POA: Insufficient documentation

## 2018-12-30 DIAGNOSIS — F419 Anxiety disorder, unspecified: Secondary | ICD-10-CM | POA: Diagnosis present

## 2018-12-30 DIAGNOSIS — Z7984 Long term (current) use of oral hypoglycemic drugs: Secondary | ICD-10-CM | POA: Diagnosis not present

## 2018-12-30 DIAGNOSIS — Z91040 Latex allergy status: Secondary | ICD-10-CM | POA: Diagnosis not present

## 2018-12-30 DIAGNOSIS — F609 Personality disorder, unspecified: Secondary | ICD-10-CM | POA: Diagnosis present

## 2018-12-30 LAB — COMPREHENSIVE METABOLIC PANEL
ALT: 19 U/L (ref 0–44)
AST: 22 U/L (ref 15–41)
Albumin: 4.6 g/dL (ref 3.5–5.0)
Alkaline Phosphatase: 55 U/L (ref 38–126)
Anion gap: 7 (ref 5–15)
BUN: 8 mg/dL (ref 8–23)
CO2: 26 mmol/L (ref 22–32)
Calcium: 9.9 mg/dL (ref 8.9–10.3)
Chloride: 109 mmol/L (ref 98–111)
Creatinine, Ser: 0.57 mg/dL (ref 0.44–1.00)
GFR calc Af Amer: >60 mL/min (ref >60)
GFR calc non Af Amer: >60 mL/min (ref >60)
Glucose, Bld: 159 mg/dL — ABNORMAL HIGH (ref 70–99)
Potassium: 4.3 mmol/L (ref 3.5–5.1)
Sodium: 142 mmol/L (ref 135–145)
Total Bilirubin: 0.4 mg/dL (ref 0.3–1.2)
Total Protein: 7.5 g/dL (ref 6.5–8.1)

## 2018-12-30 LAB — URINE DRUG SCREEN, QUALITATIVE (ARMC ONLY)
Amphetamines, Ur Screen: NOT DETECTED
Barbiturates, Ur Screen: NOT DETECTED
Benzodiazepine, Ur Scrn: NOT DETECTED
Cannabinoid 50 Ng, Ur ~~LOC~~: NOT DETECTED
Cocaine Metabolite,Ur ~~LOC~~: NOT DETECTED
MDMA (Ecstasy)Ur Screen: NOT DETECTED
Methadone Scn, Ur: NOT DETECTED
Opiate, Ur Screen: NOT DETECTED
Phencyclidine (PCP) Ur S: NOT DETECTED
Tricyclic, Ur Screen: POSITIVE — AB

## 2018-12-30 LAB — ACETAMINOPHEN LEVEL: Acetaminophen (Tylenol), Serum: <10 ug/mL — ABNORMAL LOW (ref 10–30)

## 2018-12-30 LAB — ETHANOL: Alcohol, Ethyl (B): <10 mg/dL (ref <10)

## 2018-12-30 LAB — CBC
HCT: 38.4 % (ref 36.0–46.0)
Hemoglobin: 12.9 g/dL (ref 12.0–15.0)
MCH: 31.2 pg (ref 26.0–34.0)
MCHC: 33.6 g/dL (ref 30.0–36.0)
MCV: 92.8 fL (ref 80.0–100.0)
Platelets: 227 10*3/uL (ref 150–400)
RBC: 4.14 MIL/uL (ref 3.87–5.11)
RDW: 11.9 % (ref 11.5–15.5)
WBC: 5.4 10*3/uL (ref 4.0–10.5)
nRBC: 0 % (ref 0.0–0.2)

## 2018-12-30 LAB — SALICYLATE LEVEL: Salicylate Lvl: <7 mg/dL (ref 2.8–30.0)

## 2018-12-30 NOTE — ED Triage Notes (Signed)
Pt in via POV from [facility], pt states, "For weeks, I have had suicidal thoughts, I have no plans to hurt myself or anybody else, just thoughts."  Pt A/Ox4, calm, cooperative, NAD noted at this time.

## 2018-12-30 NOTE — BH Assessment (Signed)
Writer spoke staff at [facility] [phone]) and she stated she have no concerns about the patient's safety. She further reports "the son know how she is" and he has no concerns as well. The staff believes, the outpatient provider have new staff and they are not familiar with the patient, "that's why they sent her up there [facility] ER)." Staff states, "Just call me when she's ready and we'll pick her up..."  Writer updated Psych Nurse Practitioner Waylan Boga).

## 2018-12-30 NOTE — ED Notes (Signed)
Call placed to patient's guardian Moss Mc [phone]) notifying him of patient's admission and discharge.

## 2018-12-30 NOTE — Discharge Instructions (Signed)
Living With Bipolar Disorder If you have been diagnosed with bipolar disorder, you may be relieved that you now know why you have felt or behaved a certain way. You may also feel overwhelmed about the treatment ahead, how to get the support you need, and how to deal with the condition day-to-day. With care and support, you can learn to manage your symptoms and live with bipolar disorder. How to manage lifestyle changes Managing stress Stress is your body's reaction to life changes and events, both good and bad. Stress can play a major role in bipolar disorder, so it is important to learn how to cope with stress. Some techniques to cope with stress include:  Meditation, muscle relaxation, and breathing exercises.  Exercise. Even a short daily walk can help to lower stress levels.  Getting enough good-quality sleep. Too little sleep can cause mania to start (can trigger mania).  Making a schedule to manage your time. Knowing your daily schedule can help to keep you from feeling overwhelmed by tasks and deadlines.  Spending time on hobbies that you enjoy.  Medicines Your health care provider may suggest certain medicines if he or she feels that they will help improve your condition. Avoid using caffeine, alcohol, and other substances that may prevent your medicines from working properly (may interact). It is also important to:  Talk with your pharmacist or health care provider about all the medicines that you take, their possible side effects, and which medicines are safe to take together.  Make it your goal to take part in all treatment decisions (shared decision-making). Ask about possible side effects of medicines that your health care provider recommends, and tell him or her how you feel about having those side effects. It is best if shared decision-making with your health care provider is part of your total treatment plan. If you are taking medicines as part of your treatment, do not stop  taking medicines before you ask your health care provider if it is safe to stop. You may need to have the medicine slowly decreased (tapered) over time to decrease the risk of harmful side effects. Relationships Spend time with people that you trust and with whom you feel a sense of understanding and calm. Try to find friends or family members who make you feel safe and can help you control feelings of mania. Consider going to couples counseling, family education classes, or family therapy to:  Educate your loved ones about your condition and offer suggestions about how they can support you.  Help resolve conflicts.  Help develop communication skills in your relationships.  How to recognize changes in your condition Everyone responds differently to treatment for bipolar disorder. Some signs that your condition is improving include:  Leveling of your mood. You may have less anger and excitement about daily activities, and your low moods may not be as bad.  Your symptoms being less intense.  Feeling calm more often.  Thinking clearly.  Not experiencing consequences for extreme behavior.  Feeling like your life is settling down.  Your behavior seeming more normal to you and to other people. Some signs that your condition may be getting worse include:  Sleep problems.  Moods cycling between deep lows and unusually high (excess) energy.  Extreme emotions.  More anger at loved ones.  Staying away from others (isolating yourself).  A feeling of power or superiority.  Completing a lot of tasks in a very short amount of time.  Unusual thoughts and behaviors.  Suicidal  thoughts. Where to find support Talking to others  Try making a list of the people you may want to tell about your condition, such as the people you trust most.  Plan what you are willing to talk about and what you do not want to discuss. Think about your needs ahead of time, and how your friends and family  members can support you.  Let your loved ones know when they can share advice and when you would just like them to listen.  Give your loved ones information about bipolar disorder, and encourage them to learn about the condition. Finances Not all insurance plans cover mental health care, so it is important to check with your insurance carrier. If paying for co-pays or counseling services is a problem, search for a local or county mental health care center. Public mental health care services may be offered there at a low cost or no cost when you are not able to see a private health care provider. If you are taking medicine for depression, you may be able to get the generic form, which may be less expensive than brand-name medicine. Some makers of prescription medicines also offer help to patients who cannot afford the medicines they need. Follow these instructions at home: Medicines  Take over-the-counter and prescription medicines only as told by your health care provider or pharmacist.  Ask your pharmacist what over-the-counter cold medicines you should avoid. Some medicines can make symptoms worse. General instructions  Ask for support from trusted family members or friends to make sure you stay on track with your treatment.  Keep a journal to write down your daily moods, medicines, sleep habits, and life events. This may help you have more success with your treatment.  Make and follow a routine for daily meal times. Eat healthy foods, such as whole grains, vegetables, and fresh fruit.  Try to go to sleep and wake up around the same time every day.  Keep all follow-up visits as told by your health care provider. This is important. Questions to ask your health care provider:  If you are taking medicines: ? How long do I need to take medicine? ? Are there any long-term side effects of my medicine? ? Are there any alternatives to taking medicine?  How would I benefit from  therapy?  How often should I follow up with a health care provider? Contact a health care provider if:  Your symptoms get worse or they do not get better with treatment. Get help right away if:  You have thoughts about harming yourself or others. If you ever feel like you may hurt yourself or others, or have thoughts about taking your own life, get help right away. You can go to your nearest emergency department or call:  Your local emergency services (911 in the [place]  A suicide crisis helpline, such as the [place] at 270-287-6761. This is open 24-hours a day. Summary  Learning ways to deal with stress can help to calm you and may also help your treatment work better.  There is a wide range of medicines that can help to treat bipolar disorder.  Having healthy relationships can help to make your moods more stable.  Contact a health care provider if your symptoms get worse or they do not get better with treatment. This information is not intended to replace advice given to you by your health care provider. Make sure you discuss any questions you have with your health care provider.  Document Released: 12/17/2016 Document Revised: 12/17/2016 Document Reviewed: 12/17/2016 Elsevier Interactive Patient Education  2019 [employer].

## 2018-12-30 NOTE — ED Provider Notes (Signed)
[facility] Emergency Department Provider Note   ____________________________________________   First MD Initiated Contact with Patient 12/30/18 1054     (approximate)  I have reviewed the triage vital signs and the nursing notes.   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  She is been there for about 2 years.  She has been disabled since age 69 because of severe anxiety she says.  For the last several months she reports she has been having thoughts of hurting herself.  She has not done anything about it and has not had a plan.         Past Medical History:  Diagnosis Date  . Anxiety   . Arthritis   . Colon polyps   . Depression   . Diabetes mellitus without complication ([place])   . Fibromyalgia   . Headache   . Hyperlipidemia   . Hypertension   . Parkinson's disease ([place])   . Psoriasis   . PTSD (post-traumatic stress disorder)   . Seasonal allergies   . Sleep apnea     Patient Active Problem List   Diagnosis Date Noted  . Bipolar affective disorder, depressed, moderate ([place]) 12/30/2018  . Bipolar 1 disorder, mixed, partial remission ([place]) 12/19/2015  . Personality disorder ([place]) 12/12/2015  . PTSD (post-traumatic stress disorder) 05/09/2015  . Hypertension 05/09/2015  . Diabetes ([place]) 05/09/2015    Past Surgical History:  Procedure Laterality Date  . ABDOMINAL HYSTERECTOMY    . ABDOMINAL SURGERY    . COLON SURGERY    . COLONOSCOPY    . POLYPECTOMY    . RIGHT OOPHORECTOMY    . TONSILLECTOMY      Prior to Admission medications   Medication Sig Start Date End Date Taking? Authorizing Provider  acetaminophen (TYLENOL) 325 MG tablet Take 2 tablets (650 mg total) by mouth every 6 (six) hours as needed for mild pain. Patient taking differently: Take 650 mg by mouth 3 (three) times daily.  05/22/15   Pucilowska, Jolanta B, MD  amLODipine  (NORVASC) 5 MG tablet Take 5 mg by mouth daily.    [provider]  aspirin EC 81 MG tablet Take 81 mg by mouth daily.    [provider]  atorvastatin (LIPITOR) 40 MG tablet Take 40 mg by mouth daily.    [provider]  busPIRone (BUSPAR) 10 MG tablet Take 20 mg by mouth 2 (two) times daily.    [provider]  carbidopa-levodopa (SINEMET) 25-100 MG tablet Take 1 tablet by mouth 3 (three) times daily. 12/19/15   Milton Ferguson, MD  citalopram (CELEXA) 40 MG tablet Take 40 mg by mouth daily.    [provider]  clotrimazole (LOTRIMIN) 1 % cream Apply 1 application topically 2 (two) times daily.    [provider]  cyanocobalamin 1000 MCG tablet Take 1,000 mcg by mouth daily.    [provider]  docusate sodium (COLACE) 100 MG capsule Take 100 mg by mouth 2 (two) times daily.    [provider]  EPINEPHrine (EPIPEN 2-PAK) 0.3 mg/0.3 mL IJ SOAJ injection Inject 0.3 mLs (0.3 mg total) into the muscle once as needed (for severe allergic reaction). 05/22/15   Pucilowska, Herma Ard B, MD  estradiol (ESTRACE) 1 MG tablet Take 1 mg by mouth daily.    [provider]  fluticasone (FLONASE) 50 MCG/ACT nasal spray Place 1  spray into both nostrils every evening.    [provider]  glipiZIDE (GLUCOTROL) 5 MG tablet Take by mouth daily before breakfast.    [provider]  lidocaine (LIDODERM) 5 % Place 1 patch onto the skin daily. Remove & Discard patch within 12 hours or as directed by MD    [provider]  linaclotide (LINZESS) 72 MCG capsule Take 72 mcg by mouth daily before breakfast.    [provider]  lithium 300 MG tablet Take 300 mg by mouth 2 (two) times daily.    [provider]  LORazepam (ATIVAN) 1 MG tablet Take 1 mg by mouth every 8 (eight) hours as needed for anxiety.    [provider]  Menthol-Methyl Salicylate (BEN GAY GREASELESS) 10-15 % greaseless cream Apply  1 application topically every morning.    [provider]  metFORMIN (GLUCOPHAGE) 1000 MG tablet Take 1 tablet (1,000 mg total) by mouth 2 (two) times daily. 12/19/15   Milton Ferguson, MD  metoprolol succinate (TOPROL-XL) 50 MG 24 hr tablet Take 1 tablet (50 mg total) by mouth daily. 12/19/15   Milton Ferguson, MD  Multiple Vitamin [facility]) TABS Take one tablet a day Patient taking differently: Take 1 tablet by mouth daily. Take one tablet a day 12/19/15   Milton Ferguson, MD  OLANZAPINE PO Take 15 mg by mouth daily. Take 5MG  by mouth every morning and 7.5MG  by mouth every night at bedtime    [provider]  polyethylene glycol (MIRALAX / GLYCOLAX) packet Take 17 g by mouth daily.    [provider]  QUEtiapine (SEROQUEL) 50 MG tablet Take 50 mg by mouth.    [provider]  SELENIUM SULFIDE EX Apply 1 application topically every morning.    [provider]  tizanidine (ZANAFLEX) 2 MG capsule Take 2 mg by mouth 2 (two) times daily.    [provider]    Allergies Bee venom; Demerol [meperidine]; Ivp dye [iodinated diagnostic agents]; Shellfish allergy; Adhesive [tape]; Iodine; and Latex  Family History  Problem Relation Age of Onset  . Breast cancer Maternal Grandmother     Social History Social History   Tobacco Use  . Smoking status: Former Research scientist (life sciences)  . Smokeless tobacco: Never Used  Substance Use Topics  . Alcohol use: No  . Drug use: No    Review of Systems  Constitutional: No fever/chills Eyes: No visual changes. ENT: No sore throat. Cardiovascular: Denies chest pain. Respiratory: Denies shortness of breath. Gastrointestinal: No abdominal pain.  No nausea, no vomiting.  No diarrhea.  No constipation. Genitourinary: Negative for dysuria. Musculoskeletal: Negative for back pain. Skin: Negative for rash. Neurological: Negative for headaches, focal weakness   ____________________________________________   PHYSICAL EXAM:   VITAL SIGNS: ED Triage Vitals [12/30/18 0956]  Enc Vitals Group     BP (!) 120/54     Pulse Rate 70     Resp 16     Temp 97.8 F (36.6 C)     Temp Source Oral     SpO2 99 %     Weight 155 lb (70.3 kg)     Height 5\' 6"  (1.676 m)     Head Circumference      Peak Flow      Pain Score 0     Pain Loc      Pain Edu?      Excl. in [place]?     Constitutional: Alert and oriented. Well appearing and in no acute distress.  Eyes: Conjunctivae are normal.  Head: Atraumatic. Nose: No congestion/rhinnorhea. Mouth/Throat: Mucous membranes are moist.  Oropharynx non-erythematous. Neck: No stridor.   Cardiovascular: Normal rate, regular rhythm. Grossly normal heart sounds.  Good peripheral circulation. Respiratory: Normal respiratory effort.  No retractions. Lungs CTAB. Gastrointestinal: Soft and nontender. No distention. No abdominal bruits. No CVA tenderness. Musculoskeletal: No lower extremity tenderness trace edema in the feet and ankles only.  effusions. Neurologic:  Normal speech and language. No gross focal neurologic deficits are appreciated.  Skin:  Skin is warm, dry and intact. No rash noted.   ____________________________________________   LABS (all labs ordered are listed, but only abnormal results are displayed)  Labs Reviewed  COMPREHENSIVE METABOLIC PANEL - Abnormal; Notable for the following components:      Result Value   Glucose, Bld 159 (*)    All other components within normal limits  ACETAMINOPHEN LEVEL - Abnormal; Notable for the following components:   Acetaminophen (Tylenol), Serum <10 (*)    All other components within normal limits  URINE DRUG SCREEN, QUALITATIVE ([facility] ONLY) - Abnormal; Notable for the following components:   Tricyclic, Ur Screen POSITIVE (*)    All other components within normal limits  ETHANOL  SALICYLATE LEVEL  CBC   ____________________________________________  EKG  _______________________________________  RADIOLOGY  ED MD  interpretation:    Official radiology report(s): No results found.  ____________________________________________   PROCEDURES  Procedure(s) performed (including Critical Care):  Procedures   ____________________________________________   INITIAL IMPRESSION / ASSESSMENT AND PLAN / ED COURSE   Patient seen by psychiatry.  Apparently the patient had a new therapist who did not realize that this is a chronic condition for her and sent her over here.  We will let her go back and she can continue her medications.           ____________________________________________   FINAL CLINICAL IMPRESSION(S) / ED DIAGNOSES  Final diagnoses:  Bipolar affective disorder, depressed, moderate ([place])     ED Discharge Orders         Ordered    Increase activity slowly     12/30/18 1203    Diet - low sodium heart healthy     12/30/18 1203    Discharge instructions    Comments:  Follow up with regular psychiatrist   12/30/18 1203           Note:  This document was prepared using Dragon voice recognition software and may include unintentional dictation errors.    Nena Polio, MD 12/30/18 1224

## 2018-12-30 NOTE — ED Notes (Signed)
Pt resides at [facility].  Per facility transportation staff, Clara [phone] or Marcelline Mates [phone] can be contacted for transportation back to facility at time of discharge.

## 2018-12-30 NOTE — Consult Note (Signed)
[facility] Psych ED Discharge  12/30/2018 11:46 AM Kendra Nicholson  MRN:  [id number] Principal Problem: Bipolar affective disorder, depressed, moderate ([place]) Discharge Diagnoses: Principal Problem:   Bipolar affective disorder, depressed, moderate ([place]) Active Problems:   PTSD (post-traumatic stress disorder)   Personality disorder [facility])  Subjective: 68 yo female who presented to the ED for suicidal ideations.  She met with her psychiatric provider/therapist today and reported having some suicidal ideations.  Today on assessment, she reports passive, intermittent suicidal ideations over the past two weeks related to the [place].  Her depression and anxiety have increased related to this stressor but reports being "surprised I was sent here."  No plan or intent, no past history of suicide attempts.  Denies hallucinations, homicidal ideations, and substance abuse.  Reports she is in recovery, stopped drinking and smoking at the age of 29.  Kendra Nicholson reports compliance with her medications with no recent changes.  Sleep and appetite have been "good."  No concerns.  TTS contacted her group home and they are willing to accept her back, did not feel she needs to be in the hospital or the ED.  Dr Dwyane Dee reviewed this client and concurs with the plan for her to return to her group home.  Total Time spent with patient: 45 minutes  Past Psychiatric History: bipolar affective disorder, PTSD, personality d/o  Past Medical History:  Past Medical History:  Diagnosis Date  . Anxiety   . Arthritis   . Colon polyps   . Depression   . Diabetes mellitus without complication ([place])   . Fibromyalgia   . Headache   . Hyperlipidemia   . Hypertension   . Parkinson's disease ([place])   . Psoriasis   . PTSD (post-traumatic stress disorder)   . Seasonal allergies   . Sleep apnea     Past Surgical History:  Procedure Laterality Date  . ABDOMINAL HYSTERECTOMY    . ABDOMINAL SURGERY    . COLON SURGERY    .  COLONOSCOPY    . POLYPECTOMY    . RIGHT OOPHORECTOMY    . TONSILLECTOMY     Family History:  Family History  Problem Relation Age of Onset  . Breast cancer Maternal Grandmother    Family Psychiatric  History: unknown Social History:  Social History   Substance and Sexual Activity  Alcohol Use No     Social History   Substance and Sexual Activity  Drug Use No    Social History   Socioeconomic History  . Marital status: Divorced    Spouse name: Not on file  . Number of children: Not on file  . Years of education: Not on file  . Highest education level: Not on file  Occupational History  . Not on file  Social Needs  . Financial resource strain: Not on file  . Food insecurity:    Worry: Not on file    Inability: Not on file  . Transportation needs:    Medical: Not on file    Non-medical: Not on file  Tobacco Use  . Smoking status: Former Research scientist (life sciences)  . Smokeless tobacco: Never Used  Substance and Sexual Activity  . Alcohol use: No  . Drug use: No  . Sexual activity: Not on file  Lifestyle  . Physical activity:    Days per week: Not on file    Minutes per session: Not on file  . Stress: Not on file  Relationships  . Social connections:    Talks on phone:  Not on file    Gets together: Not on file    Attends religious service: Not on file    Active member of club or organization: Not on file    Attends meetings of clubs or organizations: Not on file    Relationship status: Not on file  Other Topics Concern  . Not on file  Social History Narrative  . Not on file    Has this patient used any form of tobacco in the last 30 days? (Cigarettes, Smokeless Tobacco, Cigars, and/or Pipes) NA  Current Medications: No current facility-administered medications for this encounter.    Current Outpatient Medications  Medication Sig Dispense Refill  . acetaminophen (TYLENOL) 325 MG tablet Take 2 tablets (650 mg total) by mouth every 6 (six) hours as needed for mild pain.  (Patient taking differently: Take 650 mg by mouth 3 (three) times daily. ) 120 tablet 0  . amLODipine (NORVASC) 5 MG tablet Take 5 mg by mouth daily.    Marland Kitchen aspirin EC 81 MG tablet Take 81 mg by mouth daily.    Marland Kitchen atorvastatin (LIPITOR) 40 MG tablet Take 40 mg by mouth daily.    . busPIRone (BUSPAR) 10 MG tablet Take 20 mg by mouth 2 (two) times daily.    . carbidopa-levodopa (SINEMET) 25-100 MG tablet Take 1 tablet by mouth 3 (three) times daily. 90 tablet 0  . citalopram (CELEXA) 40 MG tablet Take 40 mg by mouth daily.    . clotrimazole (LOTRIMIN) 1 % cream Apply 1 application topically 2 (two) times daily.    . cyanocobalamin 1000 MCG tablet Take 1,000 mcg by mouth daily.    Marland Kitchen docusate sodium (COLACE) 100 MG capsule Take 100 mg by mouth 2 (two) times daily.    Marland Kitchen EPINEPHrine (EPIPEN 2-PAK) 0.3 mg/0.3 mL IJ SOAJ injection Inject 0.3 mLs (0.3 mg total) into the muscle once as needed (for severe allergic reaction). 1 Device 0  . estradiol (ESTRACE) 1 MG tablet Take 1 mg by mouth daily.    . fluticasone (FLONASE) 50 MCG/ACT nasal spray Place 1 spray into both nostrils every evening.    Marland Kitchen glipiZIDE (GLUCOTROL) 5 MG tablet Take by mouth daily before breakfast.    . lidocaine (LIDODERM) 5 % Place 1 patch onto the skin daily. Remove & Discard patch within 12 hours or as directed by MD    . linaclotide (LINZESS) 72 MCG capsule Take 72 mcg by mouth daily before breakfast.    . lithium 300 MG tablet Take 300 mg by mouth 2 (two) times daily.    Marland Kitchen LORazepam (ATIVAN) 1 MG tablet Take 1 mg by mouth every 8 (eight) hours as needed for anxiety.    . Menthol-Methyl Salicylate (BEN GAY GREASELESS) 10-15 % greaseless cream Apply 1 application topically every morning.    . metFORMIN (GLUCOPHAGE) 1000 MG tablet Take 1 tablet (1,000 mg total) by mouth 2 (two) times daily. 30 tablet 0  . metoprolol succinate (TOPROL-XL) 50 MG 24 hr tablet Take 1 tablet (50 mg total) by mouth daily. 30 tablet 0  . Multiple Vitamin  (THEREMS) TABS Take one tablet a day (Patient taking differently: Take 1 tablet by mouth daily. Take one tablet a day) 30 tablet 0  . OLANZapine (ZYPREXA) 5 MG tablet Take 5 mg by mouth at bedtime.    Marland Kitchen OLANZAPINE PO Take 15 mg by mouth daily. Take 5MG by mouth every morning and 7.5MG by mouth every night at bedtime    . polyethylene  glycol (MIRALAX / GLYCOLAX) packet Take 17 g by mouth daily.    . pravastatin (PRAVACHOL) 20 MG tablet Take 20 mg by mouth daily.    . QUEtiapine (SEROQUEL) 50 MG tablet Take 50 mg by mouth.    . SELENIUM SULFIDE EX Apply 1 application topically every morning.    . tizanidine (ZANAFLEX) 2 MG capsule Take 2 mg by mouth 2 (two) times daily.     PTA Medications: (Not in a hospital admission)   Musculoskeletal: Strength & Muscle Tone: within normal limits Gait & Station: did not witness but denied any recent falls, walks with a walker Patient leans: N/A  Psychiatric Specialty Exam: Physical Exam  Nursing note and vitals reviewed. Constitutional: She is oriented to person, place, and time. She appears well-developed and well-nourished.  HENT:  Head: Normocephalic.  Neck: Normal range of motion.  Respiratory: Effort normal.  Musculoskeletal: Normal range of motion.  Neurological: She is alert and oriented to person, place, and time.  Psychiatric: Her speech is normal and behavior is normal. Judgment and thought content normal. Her mood appears anxious. Cognition and memory are normal. She exhibits a depressed mood.    Review of Systems  Psychiatric/Behavioral: Positive for depression. The patient is nervous/anxious.   All other systems reviewed and are negative.   Blood pressure (!) 120/54, pulse 70, temperature 97.8 F (36.6 C), temperature source Oral, resp. rate 16, height 5' 6"  (1.676 m), weight 70.3 kg, SpO2 99 %.Body mass index is 25.02 kg/m.  General Appearance: Casual  Eye Contact:  Good  Speech:  Normal Rate  Volume:  Normal  Mood:  Anxious  and Depressed  Affect:  Congruent  Thought Process:  Coherent and Descriptions of Associations: Intact  Orientation:  Full (Time, Place, and Person)  Thought Content:  WDL and Logical  Suicidal Thoughts:  No  Homicidal Thoughts:  No  Memory:  Immediate;   Good Recent;   Good Remote;   Fair  Judgement:  Good  Insight:  Good  Psychomotor Activity:  Normal  Concentration:  Concentration: Good and Attention Span: Good  Recall:  Good  Fund of Knowledge:  Good  Language:  Good  Akathisia:  No  Handed:  Right  AIMS (if indicated):     Assets:  Housing Leisure Time Physical Health Resilience Social Support  ADL's:  Intact  Cognition:  WNL  Sleep:        Demographic Factors:  Age 62 or older and Caucasian  Loss Factors: NA  Historical Factors: NA  Risk Reduction Factors:   Sense of responsibility to family, Living with another person, especially a relative, Positive social support and Positive therapeutic relationship  Continued Clinical Symptoms:  Depression and anxiety, moderate  Cognitive Features That Contribute To Risk:  None    Suicide Risk:  Minimal: No identifiable suicidal ideation.  Patients presenting with no risk factors but with morbid ruminations; may be classified as minimal risk based on the severity of the depressive symptoms   Plan Of Care/Follow-up recommendations:  Bipolar affective disorder, depressed, moderate: -Continued Celexa 40 mg daily for depression -Continued Zyprexa 5 mg in the am and 7.5 mg in the pm -Continue Lithium 300 mg BID for mood stabilization -Continued Seroquel 50 mg at bedtime for mood stabilization  Anxiety: -Continued Buspar 20 mg BID -Continued Ativan 1 mg TID PRN anxiety  Parkinson's  -Continued Sinemet 25/100 mg TID   Activity:  as tolerated Diet:  heart healthy diet  Disposition: discharge back to her group  home Waylan Boga, NP 12/30/2018, 11:46 AM

## 2018-12-30 NOTE — ED Notes (Signed)
Pt dressed out into burgundy scrubs per this RN and EDT, Felicia.  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

## 2018-12-30 NOTE — ED Notes (Signed)
First Nurse Note: Pt to ED with caregiver who states that pt was sent from her MD office. Pt is here for SI

## 2018-12-30 NOTE — ED Notes (Signed)
Pt reports suicidal ideation with no plan.  Denies homicidal ideation and auditory or visual hallucinations.  Reports that her anxiety has increased in the past two weeks due to the corona virus.  States that she lives in a group home with 5 other people and she doesn't know who has what and that makes her nervous.

## 2019-01-12 DIAGNOSIS — G5600 Carpal tunnel syndrome, unspecified upper limb: Secondary | ICD-10-CM | POA: Insufficient documentation

## 2019-01-12 DIAGNOSIS — F419 Anxiety disorder, unspecified: Secondary | ICD-10-CM | POA: Insufficient documentation

## 2019-01-12 DIAGNOSIS — N951 Menopausal and female climacteric states: Secondary | ICD-10-CM | POA: Insufficient documentation

## 2019-01-12 DIAGNOSIS — D649 Anemia, unspecified: Secondary | ICD-10-CM | POA: Insufficient documentation

## 2019-01-12 DIAGNOSIS — L409 Psoriasis, unspecified: Secondary | ICD-10-CM | POA: Insufficient documentation

## 2019-01-12 DIAGNOSIS — J302 Other seasonal allergic rhinitis: Secondary | ICD-10-CM | POA: Insufficient documentation

## 2019-01-12 DIAGNOSIS — Z91038 Other insect allergy status: Secondary | ICD-10-CM | POA: Insufficient documentation

## 2019-01-13 ENCOUNTER — Encounter: Payer: Self-pay | Admitting: Podiatry

## 2019-01-13 ENCOUNTER — Other Ambulatory Visit: Payer: Self-pay

## 2019-01-13 ENCOUNTER — Ambulatory Visit (INDEPENDENT_AMBULATORY_CARE_PROVIDER_SITE_OTHER): Payer: Medicare Other | Admitting: Podiatry

## 2019-01-13 VITALS — BP 129/60 | HR 70 | Temp 97.9°F

## 2019-01-13 DIAGNOSIS — L603 Nail dystrophy: Secondary | ICD-10-CM

## 2019-01-13 MED ORDER — DOXYCYCLINE HYCLATE 100 MG PO TABS: 100.0000 mg | ORAL_TABLET | Freq: Two times a day (BID) | ORAL | 0 refills | Status: DC

## 2019-01-13 MED ORDER — GENTAMICIN SULFATE 0.1 % EX CREA: 1.0000 "application " | TOPICAL_CREAM | Freq: Two times a day (BID) | CUTANEOUS | 1 refills | Status: DC

## 2019-01-13 NOTE — Patient Instructions (Signed)

## 2019-01-16 NOTE — Progress Notes (Signed)
   Subjective: 68 year old female presenting today as a new patient with a chief complaint of discoloration and redness of the right great toenail that began 3-4 months ago. She reports associated drainage from the base of the nail. She has not done anything for treatment and denies modifying factors. Patient is here for further evaluation and treatment.   Past Medical History:  Diagnosis Date  . Anxiety   . Arthritis   . Colon polyps   . Depression   . Diabetes mellitus without complication ([place])   . Fibromyalgia   . Headache   . Hyperlipidemia   . Hypertension   . Parkinson's disease ([place])   . Psoriasis   . PTSD (post-traumatic stress disorder)   . Seasonal allergies   . Sleep apnea     Objective:  General: Well developed, nourished, in no acute distress, alert and oriented x3   Dermatology: Hyperkeratotic, discolored, thickened, onychodystrophy of the right great toenail. Skin is warm, dry and supple bilateral lower extremities. Negative for open lesions or macerations.  Vascular: Dorsalis Pedis artery and Posterior Tibial artery pedal pulses palpable. No lower extremity edema noted.   Neruologic: Grossly intact via light touch bilateral.  Musculoskeletal: Muscular strength within normal limits in all groups bilateral. Normal range of motion noted to all pedal and ankle joints.   Assessment:  #1 Dystrophic nail of the right great toe  Plan of Care:  1. Patient evaluated.  2. Discussed treatment alternatives and plan of care. Explained nail avulsion procedure and post procedure course to patient. 3. Patient opted for total temporary nail avulsion.  4. Prior to procedure, local anesthesia infiltration utilized using 3 ml of a 50:50 mixture of 2% plain lidocaine and 0.5% plain marcaine in a normal hallux block fashion and a betadine prep performed.  5. Light dressing applied. 6. Prescription for Gentamicin cream provided to patient to use daily with a bandage.  7.  Prescription for Doxycycline #20 provided to patient.  8. Return to clinic in two weeks.   Edrick Kins, DPM [facility]  Dr. Edrick Kins, [address]                                        [place], [place] [address]                Office [phone]  Fax [phone]

## 2019-01-27 ENCOUNTER — Ambulatory Visit: Payer: Medicare Other | Admitting: Podiatry

## 2019-01-31 ENCOUNTER — Other Ambulatory Visit: Payer: Self-pay

## 2019-01-31 ENCOUNTER — Ambulatory Visit (INDEPENDENT_AMBULATORY_CARE_PROVIDER_SITE_OTHER): Payer: Medicare Other | Admitting: Podiatry

## 2019-01-31 ENCOUNTER — Encounter: Payer: Self-pay | Admitting: Podiatry

## 2019-01-31 VITALS — Temp 99.1°F

## 2019-01-31 DIAGNOSIS — L603 Nail dystrophy: Secondary | ICD-10-CM

## 2019-02-03 NOTE — Progress Notes (Signed)
   Subjective: Patient presents today status post total temporary nail avulsion procedure of the right hallux that was done 01/13/2019. She states she is doing well. She reports some scabbing and has been soaking the toe for treatment. Patient is here for further evaluation and treatment.   Past Medical History:  Diagnosis Date  . Anxiety   . Arthritis   . Colon polyps   . Depression   . Diabetes mellitus without complication ([place])   . Fibromyalgia   . Headache   . Hyperlipidemia   . Hypertension   . Parkinson's disease ([place])   . Psoriasis   . PTSD (post-traumatic stress disorder)   . Seasonal allergies   . Sleep apnea     Objective: Skin is warm, dry and supple. Nail bed and respective nail fold appears to be healing appropriately. Open wound to the associated nail fold with a granular wound base and moderate amount of fibrotic tissue. Minimal drainage noted.  Assessment: #1 postop temporary total nail avulsion right hallux  #2 open wound periungual nail fold and nail bed of respective digit.   Plan of care: #1 patient was evaluated  #2 debridement of open wound was performed to the periungual border and nail fold of the respective toe using a currette. Antibiotic ointment and Band-Aid was applied. #3 patient is to return to clinic on a PRN  basis.   Edrick Kins, DPM [facility]  Dr. Edrick Kins, [address]                                        [place], [place] [address]                Office [phone]  Fax [phone]

## 2019-02-25 ENCOUNTER — Other Ambulatory Visit: Payer: Self-pay | Admitting: *Deleted

## 2019-02-25 DIAGNOSIS — Z20822 Contact with and (suspected) exposure to covid-19: Secondary | ICD-10-CM

## 2019-03-03 LAB — NOVEL CORONAVIRUS, NAA: SARS-CoV-2, NAA: NOT DETECTED

## 2019-07-04 ENCOUNTER — Other Ambulatory Visit: Payer: Self-pay | Admitting: Family Medicine

## 2019-07-04 DIAGNOSIS — Z1231 Encounter for screening mammogram for malignant neoplasm of breast: Secondary | ICD-10-CM

## 2019-08-15 ENCOUNTER — Ambulatory Visit
Admission: RE | Admit: 2019-08-15 | Discharge: 2019-08-15 | Disposition: A | Discharge status: Home / Self Care | Payer: Medicare Other | Source: Ambulatory Visit | Attending: Family Medicine | Admitting: Family Medicine

## 2019-08-15 DIAGNOSIS — Z1231 Encounter for screening mammogram for malignant neoplasm of breast: Secondary | ICD-10-CM

## 2020-06-12 ENCOUNTER — Emergency Department
Admission: EM | Admit: 2020-06-12 | Discharge: 2020-06-12 | Disposition: A | Discharge status: Home / Self Care | Payer: Medicare Other | Attending: Emergency Medicine | Admitting: Emergency Medicine

## 2020-06-12 ENCOUNTER — Other Ambulatory Visit: Payer: Self-pay

## 2020-06-12 ENCOUNTER — Emergency Department: Payer: Medicare Other

## 2020-06-12 ENCOUNTER — Encounter: Payer: Self-pay | Admitting: Intensive Care

## 2020-06-12 DIAGNOSIS — N3090 Cystitis, unspecified without hematuria: Secondary | ICD-10-CM | POA: Diagnosis not present

## 2020-06-12 DIAGNOSIS — E119 Type 2 diabetes mellitus without complications: Secondary | ICD-10-CM | POA: Insufficient documentation

## 2020-06-12 DIAGNOSIS — E860 Dehydration: Secondary | ICD-10-CM | POA: Insufficient documentation

## 2020-06-12 DIAGNOSIS — R410 Disorientation, unspecified: Secondary | ICD-10-CM | POA: Diagnosis not present

## 2020-06-12 DIAGNOSIS — Z7984 Long term (current) use of oral hypoglycemic drugs: Secondary | ICD-10-CM | POA: Diagnosis not present

## 2020-06-12 DIAGNOSIS — I2510 Atherosclerotic heart disease of native coronary artery without angina pectoris: Secondary | ICD-10-CM | POA: Diagnosis not present

## 2020-06-12 DIAGNOSIS — Z87891 Personal history of nicotine dependence: Secondary | ICD-10-CM | POA: Diagnosis not present

## 2020-06-12 DIAGNOSIS — Z7982 Long term (current) use of aspirin: Secondary | ICD-10-CM | POA: Diagnosis not present

## 2020-06-12 DIAGNOSIS — Z79899 Other long term (current) drug therapy: Secondary | ICD-10-CM | POA: Diagnosis not present

## 2020-06-12 DIAGNOSIS — I10 Essential (primary) hypertension: Secondary | ICD-10-CM | POA: Insufficient documentation

## 2020-06-12 DIAGNOSIS — R1030 Lower abdominal pain, unspecified: Secondary | ICD-10-CM | POA: Diagnosis present

## 2020-06-12 DIAGNOSIS — I7090 Unspecified atherosclerosis: Secondary | ICD-10-CM

## 2020-06-12 DIAGNOSIS — G20 Parkinson's disease: Secondary | ICD-10-CM | POA: Insufficient documentation

## 2020-06-12 LAB — URINALYSIS, COMPLETE (UACMP) WITH MICROSCOPIC
Bacteria, UA: NONE SEEN
Bilirubin Urine: NEGATIVE
Glucose, UA: NEGATIVE mg/dL
Hgb urine dipstick: NEGATIVE
Ketones, ur: NEGATIVE mg/dL
Nitrite: NEGATIVE
Protein, ur: NEGATIVE mg/dL
Specific Gravity, Urine: 1.01 (ref 1.005–1.030)
WBC, UA: >50 WBC/hpf — ABNORMAL HIGH (ref 0–5)
pH: 7 (ref 5.0–8.0)

## 2020-06-12 LAB — COMPREHENSIVE METABOLIC PANEL
ALT: <5 U/L (ref 0–44)
AST: 14 U/L — ABNORMAL LOW (ref 15–41)
Albumin: 4.4 g/dL (ref 3.5–5.0)
Alkaline Phosphatase: 47 U/L (ref 38–126)
Anion gap: 8 (ref 5–15)
BUN: 12 mg/dL (ref 8–23)
CO2: 28 mmol/L (ref 22–32)
Calcium: 9.9 mg/dL (ref 8.9–10.3)
Chloride: 104 mmol/L (ref 98–111)
Creatinine, Ser: 0.84 mg/dL (ref 0.44–1.00)
GFR, Estimated: >60 mL/min (ref >60)
Glucose, Bld: 144 mg/dL — ABNORMAL HIGH (ref 70–99)
Potassium: 4.3 mmol/L (ref 3.5–5.1)
Sodium: 140 mmol/L (ref 135–145)
Total Bilirubin: 0.8 mg/dL (ref 0.3–1.2)
Total Protein: 7.6 g/dL (ref 6.5–8.1)

## 2020-06-12 LAB — CBC
HCT: 36.5 % (ref 36.0–46.0)
Hemoglobin: 12 g/dL (ref 12.0–15.0)
MCH: 30.9 pg (ref 26.0–34.0)
MCHC: 32.9 g/dL (ref 30.0–36.0)
MCV: 94.1 fL (ref 80.0–100.0)
Platelets: 276 10*3/uL (ref 150–400)
RBC: 3.88 MIL/uL (ref 3.87–5.11)
RDW: 11.9 % (ref 11.5–15.5)
WBC: 7.3 10*3/uL (ref 4.0–10.5)
nRBC: 0 % (ref 0.0–0.2)

## 2020-06-12 LAB — LIPASE, BLOOD: Lipase: 31 U/L (ref 11–51)

## 2020-06-12 MED ORDER — SODIUM CHLORIDE 0.9 % IV SOLN
1.0000 g | Freq: Once | INTRAVENOUS | Status: AC
  Administered 2020-06-12: 1 g via INTRAVENOUS
  Filled 2020-06-12: qty 10

## 2020-06-12 MED ORDER — CEPHALEXIN 500 MG PO CAPS: 500.0000 mg | ORAL_CAPSULE | Freq: Four times a day (QID) | ORAL | 0 refills | Status: AC

## 2020-06-12 MED ORDER — LACTATED RINGERS IV BOLUS
1000.0000 mL | Freq: Once | INTRAVENOUS | Status: AC
  Administered 2020-06-12: 1000 mL via INTRAVENOUS

## 2020-06-12 NOTE — ED Notes (Signed)
Spoke with legal guardian, informed of discharge. [facility] called and advised pt was ready for pick up.

## 2020-06-12 NOTE — ED Notes (Signed)
Kendra Nicholson(DAUGHTER) who is patients legal guardian was contacted at this time [phone]

## 2020-06-12 NOTE — ED Provider Notes (Addendum)
[facility] Emergency Department Provider Note  ____________________________________________   First MD Initiated Contact with Patient 06/12/20 1148     (approximate)  I have reviewed the triage vital signs and the nursing notes.   HISTORY  Chief Complaint Abdominal Pain   HPI Kendra Nicholson is a 69 y.o. female with a past medical history of HTN, HDL, Parkinson's disease, PTSD, OSA, arthritis, anxiety, fibromyalgia, migraines, and DM who presents for assessment of approximately 2 weeks of some worsening suprapubic abdominal pain associate with some gross hematuria.  Patient denies any fevers, chills, cough, nausea, vomiting, diarrhea, burning with urination, rash, extremity pain.  I was able to confirm this information with patient's daughter although her daughter states that she is not sure if it is active intubate as patient sometimes has difficulty remembering timeline of events secondary to her Parkinson's and some dementia.         Past Medical History:  Diagnosis Date  . Anxiety   . Arthritis   . Colon polyps   . Depression   . Diabetes mellitus without complication ([place])   . Fibromyalgia   . Headache   . Hyperlipidemia   . Hypertension   . Parkinson's disease ([place])   . Psoriasis   . PTSD (post-traumatic stress disorder)   . Seasonal allergies   . Sleep apnea     Patient Active Problem List   Diagnosis Date Noted  . Anemia 01/12/2019  . Anxiety 01/12/2019  . Carpal tunnel syndrome 01/12/2019  . Menopausal syndrome 01/12/2019  . Personal history of allergy to insect bites and stings 01/12/2019  . Psoriasis (a type of skin inflammation) 01/12/2019  . Seasonal allergies 01/12/2019  . Bipolar affective disorder, depressed, moderate ([place]) 12/30/2018  . Suicidal ideation 11/27/2017  . Delirium due to multiple etiologies, acute, mixed level of activity 10/22/2017  . Encephalopathy 10/12/2017  . Confusion 10/04/2017  . Diabetes mellitus  type 2, uncomplicated ([place]) [id number]  . Fibromyalgia 10/04/2017  . Migraines 10/04/2017  . Osteoarthritis 10/04/2017  . Bipolar 1 disorder, mixed, partial remission ([place]) 12/19/2015  . Personality disorder ([place]) 12/12/2015  . Obstructive sleep apnea syndrome 06/11/2015  . PTSD (post-traumatic stress disorder) 05/09/2015  . Hypertension 05/09/2015  . Diabetes ([place]) 05/09/2015  . Bipolar disorder, current episode manic severe with psychotic features ([place]) 05/09/2015  . 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 Benign essential hypertension 01/18/2014  . Hyperlipemia, mixed 01/18/2014  . Vitamin D deficiency 01/18/2014    Past Surgical History:  Procedure Laterality Date  . ABDOMINAL HYSTERECTOMY    . ABDOMINAL SURGERY    . COLON SURGERY    . COLONOSCOPY    . POLYPECTOMY    . RIGHT OOPHORECTOMY    . TONSILLECTOMY      Prior to Admission medications   Medication Sig Start Date End Date Taking? Authorizing Provider  acetaminophen (TYLENOL) 325 MG tablet Take 2 tablets (650 mg total) by mouth every 6 (six) hours as needed for mild pain. Patient taking differently: Take 650 mg by mouth 3 (three) times daily.  05/22/15   Pucilowska, Jolanta B, MD  amLODipine (NORVASC) 5 MG tablet Take 5 mg by mouth daily.    [provider]  aspirin EC 81 MG tablet Take 81 mg by mouth daily.    [provider]  atorvastatin (LIPITOR) 40 MG tablet Take 40 mg by mouth daily.    [provider]  busPIRone (BUSPAR) 10 MG tablet Take 20 mg by mouth 2 (two) times  daily.    [provider]  carbidopa-levodopa (SINEMET) 25-100 MG tablet Take 1 tablet by mouth 3 (three) times daily. 12/19/15   Milton Ferguson, MD  Carboxymethylcellulose Sodium (THERATEARS) 0.25 % SOLN Administer 1 drop to both eyes Four (4) times a day. 12/06/17   [provider]  carvedilol (COREG) 25 MG tablet Take by mouth. 12/06/17   [provider]  cephALEXin (KEFLEX) 500 MG capsule Take 1  capsule (500 mg total) by mouth 4 (four) times daily for 5 days. 06/12/20 06/17/20  Lucrezia Starch, MD  citalopram (CELEXA) 40 MG tablet Take 40 mg by mouth daily.    [provider]  clotrimazole (LOTRIMIN) 1 % cream Apply 1 application topically 2 (two) times daily.    [provider]  cyanocobalamin 1000 MCG tablet Take 1,000 mcg by mouth daily.    [provider]  docusate sodium (COLACE) 100 MG capsule Take 100 mg by mouth 2 (two) times daily.    [provider]  doxycycline (VIBRA-TABS) 100 MG tablet Take 1 tablet (100 mg total) by mouth 2 (two) times daily. 01/13/19   Edrick Kins, DPM  EPINEPHrine (EPIPEN 2-PAK) 0.3 mg/0.3 mL IJ SOAJ injection Inject 0.3 mLs (0.3 mg total) into the muscle once as needed (for severe allergic reaction). 05/22/15   Pucilowska, Herma Ard B, MD  estradiol (ESTRACE) 1 MG tablet Take 1 mg by mouth daily.    [provider]  fluticasone (FLONASE) 50 MCG/ACT nasal spray Place 1 spray into both nostrils every evening.    [provider]  gentamicin cream (GARAMYCIN) 0.1 % Apply 1 application topically 2 (two) times daily. 01/13/19   Edrick Kins, DPM  glipiZIDE (GLUCOTROL) 5 MG tablet Take by mouth daily before breakfast.    [provider]  ibuprofen (ADVIL) 400 MG tablet Take by mouth. 12/06/17   [provider]  lidocaine (LIDODERM) 5 % Place 1 patch onto the skin daily. Remove & Discard patch within 12 hours or as directed by MD    [provider]  linaclotide (LINZESS) 72 MCG capsule Take 72 mcg by mouth daily before breakfast.    [provider]  lisinopril (ZESTRIL) 5 MG tablet Take by mouth. 12/07/17   [provider]  lithium 300 MG tablet Take 300 mg by mouth 2 (two) times daily.    [provider]  LORazepam (ATIVAN) 1 MG tablet Take 1 mg by mouth every 8 (eight) hours as needed for anxiety.    [provider]  magnesium hydroxide (MILK OF  MAGNESIA) 400 MG/5ML suspension Take by mouth.    [provider]  Melatonin 3 MG TABS Take by mouth. 12/06/17   [provider]  Menthol-Methyl Salicylate (BEN GAY GREASELESS) 10-15 % greaseless cream Apply 1 application topically every morning.    [provider]  metFORMIN (GLUCOPHAGE) 1000 MG tablet Take 1 tablet (1,000 mg total) by mouth 2 (two) times daily. 12/19/15   Milton Ferguson, MD  metoprolol succinate (TOPROL-XL) 50 MG 24 hr tablet Take 1 tablet (50 mg total) by mouth daily. 12/19/15   Milton Ferguson, MD  Multiple Vitamin [facility]) TABS Take one tablet a day Patient taking differently: Take 1 tablet by mouth daily. Take one tablet a day 12/19/15   Milton Ferguson, MD  OLANZAPINE PO Take 15 mg by mouth daily. Take 5MG  by mouth every morning and 7.5MG  by mouth every night at bedtime    [provider]  polyethylene glycol (MIRALAX /  GLYCOLAX) packet Take 17 g by mouth daily.    [provider]  QUEtiapine (SEROQUEL) 50 MG tablet Take 50 mg by mouth.    [provider]  SELENIUM SULFIDE EX Apply 1 application topically every morning.    [provider]  tiotropium (SPIRIVA) 18 MCG inhalation capsule Place into inhaler and inhale.    [provider]  tizanidine (ZANAFLEX) 2 MG capsule Take 2 mg by mouth 2 (two) times daily.    [provider]    Allergies Bee venom, Demerol [meperidine], Ivp dye [iodinated diagnostic agents], Shellfish allergy, Adhesive [tape], Iodine, and Latex  Family History  Problem Relation Age of Onset  . Breast cancer Maternal Grandmother     Social History Social History   Tobacco Use  . Smoking status: Former Research scientist (life sciences)  . Smokeless tobacco: Never Used  Vaping Use  . Vaping Use: Never used  Substance Use Topics  . Alcohol use: No  . Drug use: No    Review of Systems  Review of Systems  Constitutional: Negative for chills and fever.  HENT: Negative for sore throat.   Eyes:  Negative for pain.  Respiratory: Negative for cough and stridor.   Cardiovascular: Negative for chest pain.  Gastrointestinal: Positive for abdominal pain. Negative for vomiting.  Genitourinary: Positive for hematuria.  Musculoskeletal: Negative for myalgias.  Skin: Negative for rash.  Neurological: Negative for seizures, loss of consciousness and headaches.  Psychiatric/Behavioral: Negative for suicidal ideas.  All other systems reviewed and are negative.     ____________________________________________   PHYSICAL EXAM:  VITAL SIGNS: ED Triage Vitals  Enc Vitals Group     BP 06/12/20 1044 (!) 106/51     Pulse Rate 06/12/20 1044 60     Resp --      Temp 06/12/20 1044 98.2 F (36.8 C)     Temp Source 06/12/20 1044 Oral     SpO2 06/12/20 1044 98 %     Weight 06/12/20 1045 150 lb (68 kg)     Height 06/12/20 1045 5\' 6"  (1.676 m)     Head Circumference --      Peak Flow --      Pain Score 06/12/20 1045 3     Pain Loc --      Pain Edu? --      Excl. in [place]? --    Vitals:   06/12/20 1044  BP: (!) 106/51  Pulse: 60  Temp: 98.2 F (36.8 C)  SpO2: 98%   Physical Exam Vitals and nursing note reviewed.  Constitutional:      General: She is not in acute distress.    Appearance: She is well-developed.  HENT:     Head: Normocephalic and atraumatic.     Right Ear: External ear normal.     Left Ear: External ear normal.     Nose: Nose normal.     Mouth/Throat:     Mouth: Mucous membranes are dry.  Eyes:     Conjunctiva/sclera: Conjunctivae normal.  Cardiovascular:     Rate and Rhythm: Normal rate and regular rhythm.     Heart sounds: No murmur heard.   Pulmonary:     Effort: Pulmonary effort is normal. No respiratory distress.     Breath sounds: Normal breath sounds.  Abdominal:     Palpations: Abdomen is soft.     Tenderness: There is abdominal tenderness in the suprapubic area. There is no right CVA tenderness or left CVA tenderness.  Musculoskeletal:  Cervical back: Neck supple.  Skin:    General: Skin is warm and dry.     Capillary Refill: Capillary refill takes less than 2 seconds.  Neurological:     Mental Status: She is alert. Mental status is at baseline. She is confused.  Psychiatric:        Mood and Affect: Mood normal.      ____________________________________________   LABS (all labs ordered are listed, but only abnormal results are displayed)  Labs Reviewed  COMPREHENSIVE METABOLIC PANEL - Abnormal; Notable for the following components:      Result Value   Glucose, Bld 144 (*)    AST 14 (*)    All other components within normal limits  URINALYSIS, COMPLETE (UACMP) WITH MICROSCOPIC - Abnormal; Notable for the following components:   Color, Urine YELLOW (*)    APPearance HAZY (*)    Leukocytes,Ua LARGE (*)    WBC, UA >50 (*)    All other components within normal limits  URINE CULTURE  LIPASE, BLOOD  CBC   ____________________________________________  ____________________________________________  RADIOLOGY  No evidence of kidney stone, perinephric stranding, or other acute intra-abdominal pathology.  Official radiology report(s): CT ABDOMEN PELVIS WO CONTRAST  Result Date: 06/12/2020 CLINICAL DATA:  Generalized abdominal pain.  Dementia.  UTI. EXAM: CT ABDOMEN AND PELVIS WITHOUT CONTRAST TECHNIQUE: Multidetector CT imaging of the abdomen and pelvis was performed following the standard protocol without IV contrast. COMPARISON:  None. FINDINGS: Motion degraded scan, limiting assessment. Lower chest: No significant pulmonary nodules or acute consolidative airspace disease. Coronary atherosclerosis. Hepatobiliary: Normal liver size. No liver mass. Normal gallbladder with no radiopaque cholelithiasis. No biliary ductal dilatation. Top normal caliber common bile duct (6 mm diameter). Pancreas: Normal, with no mass or duct dilation. Spleen: Normal size. No mass. Adrenals/Urinary Tract: Normal adrenals. No  hydronephrosis. No renal stones. No contour deforming renal masses. Normal caliber ureters. No ureteral stones. Normal bladder. Stomach/Bowel: Normal non-distended stomach. Postsurgical changes from apparent subtotal right hemicolectomy with ileocolic anastomosis in the right abdomen. No dilated or thick-walled small bowel loops. No colonic wall thickening, colonic diverticulosis or significant pericolonic fat stranding in the remnant large-bowel. Vascular/Lymphatic: Atherosclerotic nonaneurysmal abdominal aorta. No pathologically enlarged lymph nodes in the abdomen or pelvis. Reproductive: Status post hysterectomy, with no abnormal findings at the vaginal cuff. No adnexal mass. Other: No pneumoperitoneum, ascites or focal fluid collection. Small to moderate fat containing midline supraumbilical ventral abdominal hernia. Musculoskeletal: No aggressive appearing focal osseous lesions. Moderate thoracolumbar spondylosis. IMPRESSION: 1. Limited motion degraded scan. No acute abnormality. No urolithiasis. No hydronephrosis. No evidence of bowel obstruction or acute bowel inflammation. 2. Small to moderate fat containing midline supraumbilical ventral abdominal hernia. 3. Coronary atherosclerosis. 4. Aortic Atherosclerosis (ICD10-I70.0). Electronically Signed   By: Ilona Sorrel M.D.   On: 06/12/2020 12:56    ____________________________________________   PROCEDURES  Procedure(s) performed (including Critical Care):  .1-3 Lead EKG Interpretation Performed by: Lucrezia Starch, MD Authorized by: Lucrezia Starch, MD     Interpretation: normal     ECG rate assessment: normal     Rhythm: sinus rhythm     Ectopy: none     Conduction: normal       ____________________________________________   INITIAL IMPRESSION / ASSESSMENT AND PLAN / ED COURSE        Patient presents with above-stated history exam for assessment of some suprapubic discomfort associate with gross hematuria that she reports is  going on 2 weeks although per review of records  and patient's daughter she sometimes has difficulty with timeline of events.  She is not oriented to date although this is baseline for patient.  However she denies any other acute complaints or recent sick symptoms.  Differential includes but is not limited to hemorrhagic cystitis, kidney stone, malignancy, appendicitis, diverticulitis, pancreatitis, and aortic pathology.  Patient has no findings on CMP or lipase to suggest cholestatic process or acute pancreatitis.  Glucose is slightly elevated at 144 but there is no evidence of acidosis or other significant ocular metabolic derangements.  CBC unremarkable without evidence of leukocytosis or anemia.  UA does appear grossly infected with large LE S and greater than 50 WBCs without evidence of blood.  Urine culture sent. Patient does have slightly soft blood pressure and dry mucous membranes and she was given IV fluids for mild dehydration.  CT shows no evidence of acute intra-abdominal pathology including evidence of stone, pyelonephritis, appendicitis, diverticulitis, or other acute process. Patient is known to have significant atherosclerosis and a small umbilical fat-containing hernia which I suspect is incidental and not contributing to her symptoms. Patient was given 1 dose of antibiotics as noted below and Rx written for Keflex. Given stable vital signs with otherwise reassuring work-up I believe patient is safe for discharge back to nursing facility. Discharged stable condition.   ____________________________________________   FINAL CLINICAL IMPRESSION(S) / ED DIAGNOSES  Final diagnoses:  Cystitis  Atherosclerosis  Dehydration    Medications  lactated ringers bolus 1,000 mL (1,000 mLs Intravenous New Bag/Given 06/12/20 1208)  cefTRIAXone (ROCEPHIN) 1 g in sodium chloride 0.9 % 100 mL IVPB (1 g Intravenous New Bag/Given 06/12/20 1208)     ED Discharge Orders         Ordered     cephALEXin (KEFLEX) 500 MG capsule  4 times daily        06/12/20 1303           Note:  This document was prepared using Dragon voice recognition software and may include unintentional dictation errors.   Lucrezia Starch, MD 06/12/20 1308    Lucrezia Starch, MD 06/12/20 1308

## 2020-06-12 NOTE — ED Triage Notes (Signed)
Pt via EMS and dx with UTI yesterday. Pt c/o abdominal pain. Pt is from [facility]. Pt has hx of dementia and is at baseline.

## 2020-06-13 LAB — URINE CULTURE: Culture: <10000 — AB

## 2020-10-29 ENCOUNTER — Emergency Department: Payer: Medicare Other

## 2020-10-29 ENCOUNTER — Other Ambulatory Visit: Payer: Self-pay

## 2020-10-29 ENCOUNTER — Inpatient Hospital Stay
Admission: EM | Admit: 2020-10-29 | Discharge: 2020-11-02 | DRG: 689 | Disposition: A | Discharge status: Skilled Nursing Facility | Payer: Medicare Other | Attending: Internal Medicine | Admitting: Internal Medicine

## 2020-10-29 DIAGNOSIS — G4733 Obstructive sleep apnea (adult) (pediatric): Secondary | ICD-10-CM | POA: Diagnosis present

## 2020-10-29 DIAGNOSIS — E785 Hyperlipidemia, unspecified: Secondary | ICD-10-CM | POA: Diagnosis present

## 2020-10-29 DIAGNOSIS — F419 Anxiety disorder, unspecified: Secondary | ICD-10-CM

## 2020-10-29 DIAGNOSIS — Z803 Family history of malignant neoplasm of breast: Secondary | ICD-10-CM

## 2020-10-29 DIAGNOSIS — Z888 Allergy status to other drugs, medicaments and biological substances status: Secondary | ICD-10-CM

## 2020-10-29 DIAGNOSIS — Z87891 Personal history of nicotine dependence: Secondary | ICD-10-CM

## 2020-10-29 DIAGNOSIS — Z91040 Latex allergy status: Secondary | ICD-10-CM

## 2020-10-29 DIAGNOSIS — G4730 Sleep apnea, unspecified: Secondary | ICD-10-CM | POA: Diagnosis present

## 2020-10-29 DIAGNOSIS — Z7984 Long term (current) use of oral hypoglycemic drugs: Secondary | ICD-10-CM

## 2020-10-29 DIAGNOSIS — R627 Adult failure to thrive: Secondary | ICD-10-CM | POA: Diagnosis present

## 2020-10-29 DIAGNOSIS — M797 Fibromyalgia: Secondary | ICD-10-CM | POA: Diagnosis present

## 2020-10-29 DIAGNOSIS — F312 Bipolar disorder, current episode manic severe with psychotic features: Secondary | ICD-10-CM

## 2020-10-29 DIAGNOSIS — R296 Repeated falls: Secondary | ICD-10-CM | POA: Diagnosis present

## 2020-10-29 DIAGNOSIS — F3132 Bipolar disorder, current episode depressed, moderate: Secondary | ICD-10-CM | POA: Diagnosis present

## 2020-10-29 DIAGNOSIS — N3000 Acute cystitis without hematuria: Principal | ICD-10-CM | POA: Diagnosis present

## 2020-10-29 DIAGNOSIS — Z8719 Personal history of other diseases of the digestive system: Secondary | ICD-10-CM

## 2020-10-29 DIAGNOSIS — Z91013 Allergy to seafood: Secondary | ICD-10-CM

## 2020-10-29 DIAGNOSIS — F4310 Post-traumatic stress disorder, unspecified: Secondary | ICD-10-CM | POA: Diagnosis present

## 2020-10-29 DIAGNOSIS — Z91041 Radiographic dye allergy status: Secondary | ICD-10-CM

## 2020-10-29 DIAGNOSIS — Z7982 Long term (current) use of aspirin: Secondary | ICD-10-CM

## 2020-10-29 DIAGNOSIS — M1990 Unspecified osteoarthritis, unspecified site: Secondary | ICD-10-CM | POA: Diagnosis present

## 2020-10-29 DIAGNOSIS — R630 Anorexia: Secondary | ICD-10-CM

## 2020-10-29 DIAGNOSIS — G20 Parkinson's disease: Secondary | ICD-10-CM | POA: Diagnosis present

## 2020-10-29 DIAGNOSIS — R54 Age-related physical debility: Secondary | ICD-10-CM | POA: Diagnosis present

## 2020-10-29 DIAGNOSIS — Z882 Allergy status to sulfonamides status: Secondary | ICD-10-CM

## 2020-10-29 DIAGNOSIS — F0280 Dementia in other diseases classified elsewhere without behavioral disturbance: Secondary | ICD-10-CM | POA: Diagnosis present

## 2020-10-29 DIAGNOSIS — I10 Essential (primary) hypertension: Secondary | ICD-10-CM | POA: Diagnosis present

## 2020-10-29 DIAGNOSIS — E440 Moderate protein-calorie malnutrition: Secondary | ICD-10-CM | POA: Diagnosis present

## 2020-10-29 DIAGNOSIS — R197 Diarrhea, unspecified: Secondary | ICD-10-CM | POA: Diagnosis present

## 2020-10-29 DIAGNOSIS — Z79899 Other long term (current) drug therapy: Secondary | ICD-10-CM

## 2020-10-29 DIAGNOSIS — G2401 Drug induced subacute dyskinesia: Secondary | ICD-10-CM | POA: Diagnosis present

## 2020-10-29 DIAGNOSIS — L409 Psoriasis, unspecified: Secondary | ICD-10-CM | POA: Diagnosis present

## 2020-10-29 DIAGNOSIS — E119 Type 2 diabetes mellitus without complications: Secondary | ICD-10-CM | POA: Diagnosis present

## 2020-10-29 DIAGNOSIS — Z91030 Bee allergy status: Secondary | ICD-10-CM

## 2020-10-29 DIAGNOSIS — G9341 Metabolic encephalopathy: Secondary | ICD-10-CM | POA: Diagnosis not present

## 2020-10-29 DIAGNOSIS — Z20822 Contact with and (suspected) exposure to covid-19: Secondary | ICD-10-CM | POA: Diagnosis present

## 2020-10-29 DIAGNOSIS — N390 Urinary tract infection, site not specified: Secondary | ICD-10-CM | POA: Diagnosis present

## 2020-10-29 DIAGNOSIS — Z6824 Body mass index (BMI) 24.0-24.9, adult: Secondary | ICD-10-CM

## 2020-10-29 DIAGNOSIS — G9340 Encephalopathy, unspecified: Secondary | ICD-10-CM | POA: Diagnosis present

## 2020-10-29 DIAGNOSIS — F3177 Bipolar disorder, in partial remission, most recent episode mixed: Secondary | ICD-10-CM | POA: Diagnosis present

## 2020-10-29 LAB — CBC WITH DIFFERENTIAL/PLATELET
Abs Immature Granulocytes: 0.02 10*3/uL (ref 0.00–0.07)
Basophils Absolute: 0 10*3/uL (ref 0.0–0.1)
Basophils Relative: 0 %
Eosinophils Absolute: 0.1 10*3/uL (ref 0.0–0.5)
Eosinophils Relative: 2 %
HCT: 37.5 % (ref 36.0–46.0)
Hemoglobin: 12.6 g/dL (ref 12.0–15.0)
Immature Granulocytes: 0 %
Lymphocytes Relative: 17 %
Lymphs Abs: 1.3 10*3/uL (ref 0.7–4.0)
MCH: 31.2 pg (ref 26.0–34.0)
MCHC: 33.6 g/dL (ref 30.0–36.0)
MCV: 92.8 fL (ref 80.0–100.0)
Monocytes Absolute: 0.6 10*3/uL (ref 0.1–1.0)
Monocytes Relative: 8 %
Neutro Abs: 5.5 10*3/uL (ref 1.7–7.7)
Neutrophils Relative %: 73 %
Platelets: 162 10*3/uL (ref 150–400)
RBC: 4.04 MIL/uL (ref 3.87–5.11)
RDW: 12.5 % (ref 11.5–15.5)
WBC: 7.5 10*3/uL (ref 4.0–10.5)
nRBC: 0 % (ref 0.0–0.2)

## 2020-10-29 LAB — COMPREHENSIVE METABOLIC PANEL
ALT: <5 U/L (ref 0–44)
AST: 15 U/L (ref 15–41)
Albumin: 4.2 g/dL (ref 3.5–5.0)
Alkaline Phosphatase: 43 U/L (ref 38–126)
Anion gap: 10 (ref 5–15)
BUN: 8 mg/dL (ref 8–23)
CO2: 21 mmol/L — ABNORMAL LOW (ref 22–32)
Calcium: 9.5 mg/dL (ref 8.9–10.3)
Chloride: 106 mmol/L (ref 98–111)
Creatinine, Ser: 0.55 mg/dL (ref 0.44–1.00)
GFR, Estimated: >60 mL/min (ref >60)
Glucose, Bld: 119 mg/dL — ABNORMAL HIGH (ref 70–99)
Potassium: 4.1 mmol/L (ref 3.5–5.1)
Sodium: 137 mmol/L (ref 135–145)
Total Bilirubin: 0.7 mg/dL (ref 0.3–1.2)
Total Protein: 7 g/dL (ref 6.5–8.1)

## 2020-10-29 LAB — URINALYSIS, COMPLETE (UACMP) WITH MICROSCOPIC
Bilirubin Urine: NEGATIVE
Glucose, UA: 150 mg/dL — AB
Ketones, ur: 5 mg/dL — AB
Nitrite: NEGATIVE
Protein, ur: 30 mg/dL — AB
Specific Gravity, Urine: 1.015 (ref 1.005–1.030)
Squamous Epithelial / HPF: NONE SEEN (ref 0–5)
pH: 7 (ref 5.0–8.0)

## 2020-10-29 LAB — TSH: TSH: 2.762 u[IU]/mL (ref 0.350–4.500)

## 2020-10-29 MED ORDER — AMLODIPINE BESYLATE 10 MG PO TABS
10.0000 mg | ORAL_TABLET | Freq: Every day | ORAL | Status: DC
  Administered 2020-10-30 – 2020-11-02 (×4): 10 mg via ORAL
  Filled 2020-10-29 (×4): qty 1

## 2020-10-29 MED ORDER — QUETIAPINE FUMARATE 25 MG PO TABS
50.0000 mg | ORAL_TABLET | Freq: Three times a day (TID) | ORAL | Status: DC
  Administered 2020-10-29 – 2020-11-02 (×11): 50 mg via ORAL
  Filled 2020-10-29 (×11): qty 2

## 2020-10-29 MED ORDER — CARBIDOPA-LEVODOPA ER 50-200 MG PO TBCR
1.0000 | EXTENDED_RELEASE_TABLET | Freq: Every day | ORAL | Status: DC
  Administered 2020-10-29 – 2020-11-01 (×4): 1 via ORAL
  Filled 2020-10-29 (×6): qty 1

## 2020-10-29 MED ORDER — ASPIRIN EC 81 MG PO TBEC
81.0000 mg | DELAYED_RELEASE_TABLET | Freq: Every day | ORAL | Status: DC
  Administered 2020-10-30 – 2020-11-02 (×4): 81 mg via ORAL
  Filled 2020-10-29 (×4): qty 1

## 2020-10-29 MED ORDER — VALBENAZINE TOSYLATE 80 MG PO CAPS: 80.0000 mg | ORAL_CAPSULE | Freq: Every day | ORAL | Status: DC

## 2020-10-29 MED ORDER — LAMOTRIGINE 100 MG PO TABS
50.0000 mg | ORAL_TABLET | Freq: Two times a day (BID) | ORAL | Status: DC
Start: 2020-10-29 — End: 2020-11-02
  Administered 2020-10-29 – 2020-11-02 (×8): 50 mg via ORAL
  Filled 2020-10-29 (×8): qty 1

## 2020-10-29 MED ORDER — ONDANSETRON HCL 4 MG PO TABS: 4.0000 mg | ORAL_TABLET | Freq: Four times a day (QID) | ORAL | Status: DC | PRN

## 2020-10-29 MED ORDER — LITHIUM CARBONATE 300 MG PO CAPS
300.0000 mg | ORAL_CAPSULE | Freq: Two times a day (BID) | ORAL | Status: DC
  Administered 2020-10-29 – 2020-11-02 (×8): 300 mg via ORAL
  Filled 2020-10-29 (×9): qty 1

## 2020-10-29 MED ORDER — SODIUM CHLORIDE 0.9 % IV SOLN
1.0000 g | INTRAVENOUS | Status: AC
  Administered 2020-10-30 – 2020-10-31 (×2): 1 g via INTRAVENOUS
  Filled 2020-10-29 (×2): qty 10

## 2020-10-29 MED ORDER — ENOXAPARIN SODIUM 40 MG/0.4ML ~~LOC~~ SOLN
40.0000 mg | SUBCUTANEOUS | Status: DC
  Administered 2020-10-29 – 2020-11-01 (×4): 40 mg via SUBCUTANEOUS
  Filled 2020-10-29 (×4): qty 0.4

## 2020-10-29 MED ORDER — ACETAMINOPHEN 650 MG RE SUPP: 325.0000 mg | Freq: Four times a day (QID) | RECTAL | Status: AC | PRN

## 2020-10-29 MED ORDER — CARVEDILOL 25 MG PO TABS
25.0000 mg | ORAL_TABLET | Freq: Two times a day (BID) | ORAL | Status: DC
  Administered 2020-10-30 – 2020-11-02 (×7): 25 mg via ORAL
  Filled 2020-10-29 (×7): qty 1

## 2020-10-29 MED ORDER — QUETIAPINE FUMARATE ER 200 MG PO TB24
200.0000 mg | ORAL_TABLET | Freq: Every day | ORAL | Status: DC
  Administered 2020-10-29 – 2020-11-01 (×4): 200 mg via ORAL
  Filled 2020-10-29 (×5): qty 1

## 2020-10-29 MED ORDER — ATORVASTATIN CALCIUM 20 MG PO TABS
40.0000 mg | ORAL_TABLET | Freq: Every day | ORAL | Status: DC
  Administered 2020-10-30 – 2020-11-01 (×3): 40 mg via ORAL
  Filled 2020-10-29 (×3): qty 2

## 2020-10-29 MED ORDER — ONDANSETRON HCL 4 MG/2ML IJ SOLN: 4.0000 mg | Freq: Four times a day (QID) | INTRAMUSCULAR | Status: DC | PRN

## 2020-10-29 MED ORDER — ACETAMINOPHEN 325 MG PO TABS: 325.0000 mg | ORAL_TABLET | Freq: Four times a day (QID) | ORAL | Status: AC | PRN

## 2020-10-29 MED ORDER — SODIUM CHLORIDE 0.9 % IV BOLUS
500.0000 mL | Freq: Once | INTRAVENOUS | Status: AC
  Administered 2020-10-29: 500 mL via INTRAVENOUS

## 2020-10-29 MED ORDER — SERTRALINE HCL 50 MG PO TABS
100.0000 mg | ORAL_TABLET | Freq: Every day | ORAL | Status: DC
  Administered 2020-10-30 – 2020-11-02 (×4): 100 mg via ORAL
  Filled 2020-10-29 (×4): qty 2

## 2020-10-29 MED ORDER — SODIUM CHLORIDE 0.9 % IV SOLN
1.0000 g | Freq: Once | INTRAVENOUS | Status: AC
  Administered 2020-10-29: 1 g via INTRAVENOUS
  Filled 2020-10-29: qty 10

## 2020-10-29 MED ORDER — METFORMIN HCL 500 MG PO TABS
1000.0000 mg | ORAL_TABLET | Freq: Two times a day (BID) | ORAL | Status: DC
  Administered 2020-10-30 – 2020-11-02 (×7): 1000 mg via ORAL
  Filled 2020-10-29 (×8): qty 2

## 2020-10-29 NOTE — ED Notes (Signed)
Meal tray provided. Patient assisted with setup but eats independently.

## 2020-10-29 NOTE — ED Triage Notes (Signed)
BIBA from group home ([facility]) for frequent falls and "acting different," unable to specify how. Sent to ED per daughter's request. Staff report most frequent fall ~1 week ago. Patient with history of dementia. Awake alert confused in NAD. Abrasion noted to R eyebrow.

## 2020-10-29 NOTE — H&P (Signed)
History and Physical   AME HEAGLE SEG:[id number] DOB: 06-27-1951 DOA: 10/29/2020  PCP: Remi Haggard, FNP  Outpatient Specialists: Dr. Sunday Corn, neurology Patient coming from: facility  I have personally briefly reviewed patient's old medical records in [place].  Chief Concern: altered mentation  HPI: Kendra Nicholson is a 70 y.o. female with medical history significant for atypical Parkinson's with right hemibody parkinsonism, hypomimia, decreased blinking speed without behavioral symptoms,Internal hemorrhoids, history of colon polyps, history of tobacco abuse, hyperlipidemia, hypertension, depression, anxiety, non-insulin-dependent diabetes mellitus, presents emergency department for chief concerns of frequent falls and acting different.  Per note, patient not eating as well as she used to.  At bedside, patient is awake alert and hemodynamically stable and very pleasant. She was able to tell me her full name. When asked what her age was she states that she is 70, she laughed when I asked her about the year and the current president states that she is not very political and does not know the current year. When asked where she is, she states that she is at a nursing facility.  ROS: Unable to complete as patient likely has advanced dementia  ED Course: Discussed with ED provider, patient required hospitalization due to urinary tract infection and not taking p.o. intake.  Vitals in the ED was reassuring with temperatures of 98.2, respiration rate 13-21, heart rate 65, blood pressure 117/82, satting at 99% on room air.  Labs in the ED was unremarkable.  UA ordered in the ED showed trace leukocytes.  Assessment/Plan  Principal Problem:   UTI (urinary tract infection) Active Problems:   PTSD (post-traumatic stress disorder)   Diabetes (HCC)   Bipolar 1 disorder, mixed, partial remission (HCC)   Bipolar affective disorder, depressed, moderate (HCC)   Anxiety   Atypical  parkinsonism (HCC)   Benign essential hypertension   Bipolar disorder, current episode manic severe with psychotic features ([place])   Diabetes mellitus type 2, uncomplicated ([place])   Obstructive sleep apnea syndrome   Failure to thrive in adult   Poor appetite   # Poor p.o. intake and frequent falls, suspect secondary to urinary tract infection, with component of failure to thrive -Ceftriaxone IV 1 g every 24 hours -Urine culture -SLP evaluation ordered  Frequent falls-PT, OT, TOC  Hypertension-amlodipine 10 mg daily, carvedilol 25 mg twice daily resumed Hyperlipidemia atorvastatin 40 mg daily resumed Psychiatric imbalance-lithium 300 mg twice daily, quetiapine 50 mg 3 times daily, quetiapine 200 mg nightly Depression-sertraline 100 mg daily History of tardive dyskinesia-resumed Non-insulin-dependent diabetes mellitus-Metformin 1000 mg twice daily Reported diarrhea-C. difficile, GI panel check Atypical Parkinson's-carbidopa levodopa 50-200 qhs  Chart reviewed.   DVT prophylaxis: Enoxaparin Code Status: Full code Diet: Heart healthy/carb modified Family Communication: Attempted to reach daughter, who is the power of attorney at [phone], no pickup Disposition Plan: Pending clinical course, PT evaluation Consults called: None at this time Admission status: Observation with telemetry  Past Medical History:  Diagnosis Date  . Anxiety   . Arthritis   . Colon polyps   . Depression   . Diabetes mellitus without complication ([place])   . Fibromyalgia   . Headache   . Hyperlipidemia   . Hypertension   . Parkinson's disease ([place])   . Psoriasis   . PTSD (post-traumatic stress disorder)   . Seasonal allergies   . Sleep apnea    Past Surgical History:  Procedure Laterality Date  . ABDOMINAL HYSTERECTOMY    . ABDOMINAL SURGERY    .  COLON SURGERY    . COLONOSCOPY    . POLYPECTOMY    . RIGHT OOPHORECTOMY    . TONSILLECTOMY     Social History:  reports that she has quit  smoking. She has never used smokeless tobacco. She reports that she does not drink alcohol and does not use drugs.  Allergies  Allergen Reactions  . Bee Venom Anaphylaxis  . Demerol [Meperidine] Anaphylaxis  . Ivp Dye [Iodinated Diagnostic Agents] Anaphylaxis  . Shellfish Allergy Anaphylaxis  . Sulfa Antibiotics   . Adhesive [Tape] Rash  . Iodine Rash  . Latex Rash   Family History  Problem Relation Age of Onset  . Breast cancer Maternal Grandmother    Family history: Family history reviewed and not pertinent  Prior to Admission medications   Medication Sig Start Date End Date Taking? Authorizing Provider  Valbenazine Tosylate [facility]) 80 MG CAPS Take by mouth.   Yes [provider]  acetaminophen (TYLENOL) 325 MG tablet Take 2 tablets (650 mg total) by mouth every 6 (six) hours as needed for mild pain. Patient taking differently: Take 650 mg by mouth 3 (three) times daily.  05/22/15   Pucilowska, Jolanta B, MD  amLODipine (NORVASC) 5 MG tablet Take 5 mg by mouth daily.    [provider]  aspirin EC 81 MG tablet Take 81 mg by mouth daily.    [provider]  atorvastatin (LIPITOR) 40 MG tablet Take 40 mg by mouth daily.    [provider]  busPIRone (BUSPAR) 10 MG tablet Take 20 mg by mouth 2 (two) times daily.    [provider]  carbidopa-levodopa (SINEMET) 25-100 MG tablet Take 1 tablet by mouth 3 (three) times daily. 12/19/15   Milton Ferguson, MD  Carboxymethylcellulose Sodium (THERATEARS) 0.25 % SOLN Administer 1 drop to both eyes Four (4) times a day. 12/06/17   [provider]  carvedilol (COREG) 25 MG tablet Take by mouth. 12/06/17   [provider]  citalopram (CELEXA) 40 MG tablet Take 40 mg by mouth daily.    [provider]  clotrimazole (LOTRIMIN) 1 % cream Apply 1 application topically 2 (two) times daily.    [provider]  cyanocobalamin 1000 MCG tablet Take 1,000 mcg by mouth daily.     [provider]  docusate sodium (COLACE) 100 MG capsule Take 100 mg by mouth 2 (two) times daily.    [provider]  doxycycline (VIBRA-TABS) 100 MG tablet Take 1 tablet (100 mg total) by mouth 2 (two) times daily. 01/13/19   Edrick Kins, DPM  EPINEPHrine (EPIPEN 2-PAK) 0.3 mg/0.3 mL IJ SOAJ injection Inject 0.3 mLs (0.3 mg total) into the muscle once as needed (for severe allergic reaction). 05/22/15   Pucilowska, Herma Ard B, MD  estradiol (ESTRACE) 1 MG tablet Take 1 mg by mouth daily.    [provider]  fluticasone (FLONASE) 50 MCG/ACT nasal spray Place 1 spray into both nostrils every evening.    [provider]  gentamicin cream (GARAMYCIN) 0.1 % Apply 1 application topically 2 (two) times daily. 01/13/19   Edrick Kins, DPM  glipiZIDE (GLUCOTROL) 5 MG tablet Take by mouth daily before breakfast.    [provider]  ibuprofen (ADVIL) 400 MG tablet Take by mouth. 12/06/17   [provider]  lidocaine (LIDODERM) 5 % Place 1 patch onto the skin daily. Remove & Discard patch within 12 hours or as directed by MD    [provider]  linaclotide (  LINZESS) 72 MCG capsule Take 72 mcg by mouth daily before breakfast.    [provider]  lisinopril (ZESTRIL) 5 MG tablet Take by mouth. 12/07/17   [provider]  lithium 300 MG tablet Take 300 mg by mouth 2 (two) times daily.    [provider]  LORazepam (ATIVAN) 1 MG tablet Take 1 mg by mouth every 8 (eight) hours as needed for anxiety.    [provider]  magnesium hydroxide (MILK OF MAGNESIA) 400 MG/5ML suspension Take by mouth.    [provider]  Melatonin 3 MG TABS Take by mouth. 12/06/17   [provider]  Menthol-Methyl Salicylate (BEN GAY GREASELESS) 10-15 % greaseless cream Apply 1 application topically every morning.    [provider]  metFORMIN (GLUCOPHAGE) 1000 MG tablet Take 1 tablet (1,000 mg total) by mouth 2 (two)  times daily. 12/19/15   Milton Ferguson, MD  metoprolol succinate (TOPROL-XL) 50 MG 24 hr tablet Take 1 tablet (50 mg total) by mouth daily. 12/19/15   Milton Ferguson, MD  Multiple Vitamin [facility]) TABS Take one tablet a day Patient taking differently: Take 1 tablet by mouth daily. Take one tablet a day 12/19/15   Milton Ferguson, MD  OLANZAPINE PO Take 15 mg by mouth daily. Take 5MG  by mouth every morning and 7.5MG  by mouth every night at bedtime    [provider]  polyethylene glycol (MIRALAX / GLYCOLAX) packet Take 17 g by mouth daily.    [provider]  QUEtiapine (SEROQUEL) 50 MG tablet Take 50 mg by mouth.    [provider]  SELENIUM SULFIDE EX Apply 1 application topically every morning.    [provider]  tiotropium (SPIRIVA) 18 MCG inhalation capsule Place into inhaler and inhale.    [provider]  tizanidine (ZANAFLEX) 2 MG capsule Take 2 mg by mouth 2 (two) times daily.    [provider]   Physical Exam: Vitals:   10/29/20 1212 10/29/20 1400 10/29/20 1930 10/29/20 2141  BP: (!) 123/48 120/77 (!) 107/48 112/65  Pulse: 61 61 60 (!) 58  Resp: 16 16 18 18   Temp:    98.6 F (37 C)  TempSrc:      SpO2: 100% 99% 96% 97%  Weight:      Height:       Constitutional: appears age appropriate, NAD, calm, comfortable Eyes: PERRL, lids and conjunctivae normal ENMT: Mucous membranes are moist. Posterior pharynx clear of any exudate or lesions. Age-appropriate dentition. Hearing appropriate Neck: normal, supple, no masses, no thyromegaly Respiratory: clear to auscultation bilaterally, no wheezing, no crackles. Normal respiratory effort. No accessory muscle use.  Cardiovascular: Regular rate and rhythm, no murmurs / rubs / gallops. No extremity edema. 2+ pedal pulses. No carotid bruits.  Abdomen: no tenderness, no masses palpated, no hepatosplenomegaly. Bowel sounds positive.  Musculoskeletal: no clubbing / cyanosis. No joint deformity  upper and lower extremities. Good ROM, no contractures, no atrophy. Normal muscle tone.  Skin: no rashes, lesions, ulcers. No induration Neurologic: Sensation intact. Strength 5/5 in all 4.  Psychiatric: Normal judgment and insight. Alert and oriented x self only. Normal mood.   EKG: independently reviewed, showing sinus rhythm, rate of 63, QTc 475  Chest x-ray on Admission: I personally reviewed and I agree with radiologist reading as below.  CT Head Wo Contrast  Result Date: 10/29/2020 CLINICAL DATA:  Frequent falls EXAM: CT HEAD WITHOUT CONTRAST TECHNIQUE: Contiguous axial images were obtained from the base of the  skull through the vertex without intravenous contrast. COMPARISON:  10/01/2017 FINDINGS: Brain: There is atrophy and chronic small vessel disease changes. No acute intracranial abnormality. Specifically, no hemorrhage, hydrocephalus, mass lesion, acute infarction, or significant intracranial injury. Vascular: No hyperdense vessel or unexpected calcification. Skull: No acute calvarial abnormality. Sinuses/Orbits: Visualized paranasal sinuses and mastoids clear. Orbital soft tissues unremarkable. Other: None IMPRESSION: Atrophy, chronic microvascular disease. No acute intracranial abnormality. Electronically Signed   By: Rolm Baptise M.D.   On: 10/29/2020 11:27   CT Cervical Spine Wo Contrast  Result Date: 10/29/2020 CLINICAL DATA:  Frequent falls EXAM: CT CERVICAL SPINE WITHOUT CONTRAST TECHNIQUE: Multidetector CT imaging of the cervical spine was performed without intravenous contrast. Multiplanar CT image reconstructions were also generated. COMPARISON:  None. FINDINGS: Alignment: No subluxation. Skull base and vertebrae: No acute fracture. No primary bone lesion or focal pathologic process. Soft tissues and spinal canal: No prevertebral fluid or swelling. No visible canal hematoma. Disc levels: Diffuse degenerative facet disease bilaterally, left greater than right. Early anterior  spurring. Disc spaces maintained. Upper chest: No acute findings Other: None IMPRESSION: Degenerative facet disease bilaterally, left greater than right. No acute bony abnormality. Electronically Signed   By: Rolm Baptise M.D.   On: 10/29/2020 11:28   DG Chest Portable 1 View  Result Date: 10/29/2020 CLINICAL DATA:  Weakness, bilateral hand pain.  Fall. EXAM: PORTABLE CHEST 1 VIEW COMPARISON:  10/01/2017 FINDINGS: Heart and mediastinal contours are within normal limits. No focal opacities or effusions. No acute bony abnormality. Aortic atherosclerosis. IMPRESSION: No active cardiopulmonary disease. Electronically Signed   By: Rolm Baptise M.D.   On: 10/29/2020 11:28   Labs on Admission: I have personally reviewed following labs  CBC: Recent Labs  Lab 10/29/20 1122  WBC 7.5  NEUTROABS 5.5  HGB 12.6  HCT 37.5  MCV 92.8  PLT 170   Basic Metabolic Panel: Recent Labs  Lab 10/29/20 1122  NA 137  K 4.1  CL 106  CO2 21*  GLUCOSE 119*  BUN 8  CREATININE 0.55  CALCIUM 9.5   GFR: Estimated Creatinine Clearance: 61.3 mL/min (by C-G formula based on SCr of 0.55 mg/dL).  Liver Function Tests: Recent Labs  Lab 10/29/20 1122  AST 15  ALT <5  ALKPHOS 43  BILITOT 0.7  PROT 7.0  ALBUMIN 4.2   Urine analysis:    Component Value Date/Time   COLORURINE AMBER (A) 10/29/2020 1212   APPEARANCEUR CLOUDY (A) 10/29/2020 1212   LABSPEC 1.015 10/29/2020 1212   PHURINE 7.0 10/29/2020 1212   GLUCOSEU 150 (A) 10/29/2020 1212   HGBUR SMALL (A) 10/29/2020 1212   BILIRUBINUR NEGATIVE 10/29/2020 1212   KETONESUR 5 (A) 10/29/2020 1212   PROTEINUR 30 (A) 10/29/2020 1212   NITRITE NEGATIVE 10/29/2020 1212   LEUKOCYTESUR TRACE (A) 10/29/2020 1212   Lenah Messenger N Wladyslawa Disbro D.O. [facility]  If 7PM-7AM, please contact overnight-coverage provider If 7AM-7PM, please contact day coverage provider [email or web address]  10/29/2020, 10:49 PM

## 2020-10-29 NOTE — ED Notes (Signed)
Informed RN bed assigned [id number]

## 2020-10-29 NOTE — ED Notes (Signed)
Patient up to [facility] with standby assist, tolerated well.

## 2020-10-29 NOTE — ED Provider Notes (Signed)
[facility] Emergency Department Provider Note    Event Date/Time   First MD Initiated Contact with Patient 10/29/20 1038     (approximate)  I have reviewed the triage vital signs and the nursing notes.   HISTORY  Chief Complaint Fall    HPI Kendra Nicholson is a 70 y.o. female with the below listed past medical history who presents to the ER for evaluation of falls decreased p.o. intake and decline in her mental status over the past several months.  She coming from assisted living facility and they are having trouble meeting her her needs.  According the daughter she has had fairly rapid decline that started several weeks ago noticing increasing confusion and requiring additional assistance with ADLs.  Has had a couple falls.  Not complaining of any pain.  No significant medication changes.  They felt that they were any to put her at a higher level of care facility by her right meeting roadblocks in getting her the resources that she needs.  Does have a history of urinary tract infections.    Past Medical History:  Diagnosis Date  . Anxiety   . Arthritis   . Colon polyps   . Depression   . Diabetes mellitus without complication ([place])   . Fibromyalgia   . Headache   . Hyperlipidemia   . Hypertension   . Parkinson's disease ([place])   . Psoriasis   . PTSD (post-traumatic stress disorder)   . Seasonal allergies   . Sleep apnea    Family History  Problem Relation Age of Onset  . Breast cancer Maternal Grandmother    Past Surgical History:  Procedure Laterality Date  . ABDOMINAL HYSTERECTOMY    . ABDOMINAL SURGERY    . COLON SURGERY    . COLONOSCOPY    . POLYPECTOMY    . RIGHT OOPHORECTOMY    . TONSILLECTOMY     Patient Active Problem List   Diagnosis Date Noted  . Failure to thrive in adult 10/29/2020  . Anemia 01/12/2019  . Anxiety 01/12/2019  . Carpal tunnel syndrome 01/12/2019  . Menopausal syndrome 01/12/2019  . Personal history of  allergy to insect bites and stings 01/12/2019  . Psoriasis (a type of skin inflammation) 01/12/2019  . Seasonal allergies 01/12/2019  . Bipolar affective disorder, depressed, moderate ([place]) 12/30/2018  . Suicidal ideation 11/27/2017  . Delirium due to multiple etiologies, acute, mixed level of activity 10/22/2017  . Encephalopathy 10/12/2017  . Confusion 10/04/2017  . Diabetes mellitus type 2, uncomplicated ([place]) 38/18/2993  . Fibromyalgia 10/04/2017  . Migraines 10/04/2017  . Osteoarthritis 10/04/2017  . Bipolar 1 disorder, mixed, partial remission ([place]) 12/19/2015  . Personality disorder ([place]) 12/12/2015  . Obstructive sleep apnea syndrome 06/11/2015  . PTSD (post-traumatic stress disorder) 05/09/2015  . Hypertension 05/09/2015  . Diabetes ([place]) 05/09/2015  . Bipolar disorder, current episode manic severe with psychotic features ([place]) 05/09/2015  . 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 Benign essential hypertension 01/18/2014  . Hyperlipemia, mixed 01/18/2014  . Vitamin D deficiency 01/18/2014      Prior to Admission medications   Medication Sig Start Date End Date Taking? Authorizing Provider  acetaminophen (TYLENOL) 325 MG tablet Take 2 tablets (650 mg total) by mouth every 6 (six) hours as needed for mild pain. Patient taking differently: Take 650 mg by mouth 3 (three) times daily.  05/22/15   Pucilowska, Jolanta B, MD  amLODipine (NORVASC) 5 MG tablet Take 5 mg by mouth daily.  [provider]  aspirin EC 81 MG tablet Take 81 mg by mouth daily.    [provider]  atorvastatin (LIPITOR) 40 MG tablet Take 40 mg by mouth daily.    [provider]  busPIRone (BUSPAR) 10 MG tablet Take 20 mg by mouth 2 (two) times daily.    [provider]  carbidopa-levodopa (SINEMET) 25-100 MG tablet Take 1 tablet by mouth 3 (three) times daily. 12/19/15   Milton Ferguson, MD  Carboxymethylcellulose Sodium (THERATEARS) 0.25 % SOLN Administer 1 drop to  both eyes Four (4) times a day. 12/06/17   [provider]  carvedilol (COREG) 25 MG tablet Take by mouth. 12/06/17   [provider]  citalopram (CELEXA) 40 MG tablet Take 40 mg by mouth daily.    [provider]  clotrimazole (LOTRIMIN) 1 % cream Apply 1 application topically 2 (two) times daily.    [provider]  cyanocobalamin 1000 MCG tablet Take 1,000 mcg by mouth daily.    [provider]  docusate sodium (COLACE) 100 MG capsule Take 100 mg by mouth 2 (two) times daily.    [provider]  doxycycline (VIBRA-TABS) 100 MG tablet Take 1 tablet (100 mg total) by mouth 2 (two) times daily. 01/13/19   Edrick Kins, DPM  EPINEPHrine (EPIPEN 2-PAK) 0.3 mg/0.3 mL IJ SOAJ injection Inject 0.3 mLs (0.3 mg total) into the muscle once as needed (for severe allergic reaction). 05/22/15   Pucilowska, Herma Ard B, MD  estradiol (ESTRACE) 1 MG tablet Take 1 mg by mouth daily.    [provider]  fluticasone (FLONASE) 50 MCG/ACT nasal spray Place 1 spray into both nostrils every evening.    [provider]  gentamicin cream (GARAMYCIN) 0.1 % Apply 1 application topically 2 (two) times daily. 01/13/19   Edrick Kins, DPM  glipiZIDE (GLUCOTROL) 5 MG tablet Take by mouth daily before breakfast.    [provider]  ibuprofen (ADVIL) 400 MG tablet Take by mouth. 12/06/17   [provider]  lidocaine (LIDODERM) 5 % Place 1 patch onto the skin daily. Remove & Discard patch within 12 hours or as directed by MD    [provider]  linaclotide (LINZESS) 72 MCG capsule Take 72 mcg by mouth daily before breakfast.    [provider]  lisinopril (ZESTRIL) 5 MG tablet Take by mouth. 12/07/17   [provider]  lithium 300 MG tablet Take 300 mg by mouth 2 (two) times daily.    [provider]  LORazepam (ATIVAN) 1 MG tablet Take 1 mg by mouth every 8 (eight) hours as needed for anxiety.    [provider]  magnesium hydroxide (MILK OF MAGNESIA) 400 MG/5ML suspension Take by mouth.    [provider]  Melatonin 3 MG TABS Take by mouth. 12/06/17   [provider]  Menthol-Methyl Salicylate (BEN GAY GREASELESS) 10-15 % greaseless cream Apply 1 application topically every morning.    [provider]  metFORMIN (GLUCOPHAGE) 1000 MG tablet Take 1 tablet (1,000 mg total) by mouth 2 (two) times daily. 12/19/15   Milton Ferguson, MD  metoprolol succinate (TOPROL-XL) 50 MG 24 hr tablet Take 1 tablet (50 mg total) by mouth daily. 12/19/15   Milton Ferguson, MD  Multiple Vitamin [facility]) TABS Take one tablet a day Patient taking differently: Take 1 tablet by mouth daily. Take one tablet a day 12/19/15   Milton Ferguson, MD  OLANZAPINE PO Take 15 mg by mouth  daily. Take 5MG  by mouth every morning and 7.5MG  by mouth every night at bedtime    [provider]  polyethylene glycol (MIRALAX / GLYCOLAX) packet Take 17 g by mouth daily.    [provider]  QUEtiapine (SEROQUEL) 50 MG tablet Take 50 mg by mouth.    [provider]  SELENIUM SULFIDE EX Apply 1 application topically every morning.    [provider]  tiotropium (SPIRIVA) 18 MCG inhalation capsule Place into inhaler and inhale.    [provider]  tizanidine (ZANAFLEX) 2 MG capsule Take 2 mg by mouth 2 (two) times daily.    [provider]    Allergies Bee venom, Demerol [meperidine], Ivp dye [iodinated diagnostic agents], Shellfish allergy, Adhesive [tape], Iodine, and Latex    Social History Social History   Tobacco Use  . Smoking status: Former Research scientist (life sciences)  . Smokeless tobacco: Never Used  Vaping Use  . Vaping Use: Never used  Substance Use Topics  . Alcohol use: No  . Drug use: No    Review of Systems Patient denies headaches, rhinorrhea, blurry vision, numbness, shortness of breath, chest pain, edema, cough, abdominal pain, nausea, vomiting, diarrhea,  dysuria, fevers, rashes or hallucinations unless otherwise stated above in HPI. ____________________________________________   PHYSICAL EXAM:  VITAL SIGNS: Vitals:   10/29/20 1212 10/29/20 1400  BP: (!) 123/48 120/77  Pulse: 61 61  Resp: 16 16  Temp:    SpO2: 100% 99%    Constitutional: Alert disoriented x 3  Eyes: Conjunctivae are normal.  Head: contusion to right forehead Nose: No congestion/rhinnorhea. Mouth/Throat: Mucous membranes are moist.   Neck: No stridor. Painless ROM.  Cardiovascular: Normal rate, regular rhythm. Grossly normal heart sounds.  Good peripheral circulation. Respiratory: Normal respiratory effort.  No retractions. Lungs CTAB. Gastrointestinal: Soft and nontender. No distention. No abdominal bruits. No CVA tenderness. Musculoskeletal: No lower extremity tenderness nor edema.  No joint effusions. Neurologic:  No gross focal neurologic deficits are appreciated. No facial droop Skin:  Skin is warm, dry and intact. No rash noted. Psychiatric: calm cooperative  ____________________________________________   LABS (all labs ordered are listed, but only abnormal results are displayed)  Results for orders placed or performed during the hospital encounter of 10/29/20 (from the past 24 hour(s))  CBC with Differential     Status: None   Collection Time: 10/29/20 11:22 AM  Result Value Ref Range   WBC 7.5 4.0 - 10.5 K/uL   RBC 4.04 3.87 - 5.11 MIL/uL   Hemoglobin 12.6 12.0 - 15.0 g/dL   HCT 37.5 36.0 - 46.0 %   MCV 92.8 80.0 - 100.0 fL   MCH 31.2 26.0 - 34.0 pg   MCHC 33.6 30.0 - 36.0 g/dL   RDW 12.5 11.5 - 15.5 %   Platelets 162 150 - 400 K/uL   nRBC 0.0 0.0 - 0.2 %   Neutrophils Relative % 73 %   Neutro Abs 5.5 1.7 - 7.7 K/uL   Lymphocytes Relative 17 %   Lymphs Abs 1.3 0.7 - 4.0 K/uL   Monocytes Relative 8 %   Monocytes Absolute 0.6 0.1 - 1.0 K/uL   Eosinophils Relative 2 %   Eosinophils Absolute 0.1 0.0 - 0.5 K/uL   Basophils Relative 0 %    Basophils Absolute 0.0 0.0 - 0.1 K/uL   Immature Granulocytes 0 %   Abs Immature Granulocytes 0.02 0.00 - 0.07 K/uL  Comprehensive metabolic panel     Status: Abnormal   Collection Time:  10/29/20 11:22 AM  Result Value Ref Range   Sodium 137 135 - 145 mmol/L   Potassium 4.1 3.5 - 5.1 mmol/L   Chloride 106 98 - 111 mmol/L   CO2 21 (L) 22 - 32 mmol/L   Glucose, Bld 119 (H) 70 - 99 mg/dL   BUN 8 8 - 23 mg/dL   Creatinine, Ser 0.55 0.44 - 1.00 mg/dL   Calcium 9.5 8.9 - 10.3 mg/dL   Total Protein 7.0 6.5 - 8.1 g/dL   Albumin 4.2 3.5 - 5.0 g/dL   AST 15 15 - 41 U/L   ALT <5 0 - 44 U/L   Alkaline Phosphatase 43 38 - 126 U/L   Total Bilirubin 0.7 0.3 - 1.2 mg/dL   GFR, Estimated >60 >60 mL/min   Anion gap 10 5 - 15  Urinalysis, Complete w Microscopic Urine, Clean Catch     Status: Abnormal   Collection Time: 10/29/20 12:12 PM  Result Value Ref Range   Color, Urine AMBER (A) YELLOW   APPearance CLOUDY (A) CLEAR   Specific Gravity, Urine 1.015 1.005 - 1.030   pH 7.0 5.0 - 8.0   Glucose, UA 150 (A) NEGATIVE mg/dL   Hgb urine dipstick SMALL (A) NEGATIVE   Bilirubin Urine NEGATIVE NEGATIVE   Ketones, ur 5 (A) NEGATIVE mg/dL   Protein, ur 30 (A) NEGATIVE mg/dL   Nitrite NEGATIVE NEGATIVE   Leukocytes,Ua TRACE (A) NEGATIVE   RBC / HPF 0-5 0 - 5 RBC/hpf   WBC, UA 21-50 0 - 5 WBC/hpf   Bacteria, UA FEW (A) NONE SEEN   Squamous Epithelial / LPF NONE SEEN 0 - 5   WBC Clumps PRESENT    ____________________________________________  EKG My review and personal interpretation at Time: 10:35   Indication: fall  Rate: 60  Rhythm: sinus Axis: normal Other: normal intervals, no stemi ____________________________________________  RADIOLOGY  I personally reviewed all radiographic images ordered to evaluate for the above acute complaints and reviewed radiology reports and findings.  These findings were personally discussed with the patient.  Please see medical record for radiology  report.  ____________________________________________   PROCEDURES  Procedure(s) performed:  Procedures    Critical Care performed: no ____________________________________________   INITIAL IMPRESSION / ASSESSMENT AND PLAN / ED COURSE  Pertinent labs & imaging results that were available during my care of the patient were reviewed by me and considered in my medical decision making (see chart for details).   DDX: Dehydration, sepsis, pna, uti, hypoglycemia, cva, drug effect, withdrawal, encephalitis  Kendra Nicholson is a 70 y.o. who presents to the ED with symptoms as described above.  Having frequent falls decreased p.o. intake as well as increasing confusion.  With these falls and contusion today she was sent and CT head was ordered which shows no evidence of acute intracranial abnormality.  No evidence of osseous fracture.  Patient does appear more confused than is reported at her baseline.  She not hypoxic not septic.  Her abdominal exam is benign.  Will call daughter for additional information.  Clinical Course as of 10/29/20 1534  Tue Oct 29, 2020  1306 Daughter reports increased frequency foul odor will cover with antibiotics that she does have few bacteria with white cells leukocytes positive urine. [PR]  1517 Updated daughter regarding patient's work-up.  Blood was reassuring but given the patient's fairly rapid decline with increasing falls and what sounds to be symptomatic UTI coming from assisted living facility will discuss with hospitalist for observation  for IV antibiotics.  Have discussed with the patient and available family all diagnostics and treatments performed thus far and all questions were answered to the best of my ability. The patient demonstrates understanding and agreement with plan.  [PR]    Clinical Course User Index [PR] Merlyn Lot, MD    The patient was evaluated in Emergency Department today for the symptoms described in the history of present  illness. He/she was evaluated in the context of the global COVID-19 pandemic, which necessitated consideration that the patient might be at risk for infection with the SARS-CoV-2 virus that causes COVID-19. Institutional protocols and algorithms that pertain to the evaluation of patients at risk for COVID-19 are in a state of rapid change based on information released by regulatory bodies including the CDC and federal and state organizations. These policies and algorithms were followed during the patient's care in the ED.  As part of my medical decision making, I reviewed the following data within the [facility] notes reviewed and incorporated, Labs reviewed, notes from prior ED visits and [place] Controlled Substance Database   ____________________________________________   FINAL CLINICAL IMPRESSION(S) / ED DIAGNOSES  Final diagnoses:  Acute cystitis without hematuria  Frequent falls      NEW MEDICATIONS STARTED DURING THIS VISIT:  New Prescriptions   No medications on file     Note:  This document was prepared using Dragon voice recognition software and may include unintentional dictation errors.    Merlyn Lot, MD 10/29/20 1534

## 2020-10-29 NOTE — TOC Initial Note (Signed)
Transition of Care [facility]) - Initial/Assessment Note    Patient Details  Name: Kendra Nicholson MRN: [id number] Date of Birth: 08/08/1951  [facility]) CM/SW Contact:    Ova Freshwater Phone Number: [phone] 10/29/2020, 2:37 PM  Clinical Narrative:                  Patient presents to [facility] ED due to fall at [facility].  CSW spoke with patient's Harless Litten (Daughter) [phone] [facility]).  CSW explained TOC role in patient care.  Ms. Barbette Merino stated the patient has been living at [facility] for a few years but she thinks the patient needs a higher level of care because she is unable to perform certain tasks by herself.  Ms. Barbette Merino stated the patient is unable to dress herself, forgets to eat, forgets to bathe and has frequent falls.  Ms. Barbette Merino stated she would like for the patient to go to a memory care unit.  CSW and Ms. Lanphear spoke about payor sources for memory care, patient has both Medicare and Medicaid.  CSW explained the placement process and timeline.  CSW also explained the challenge in finding memory care Medicaid beds and that we would not be able to guarantee placement and that we may only be abel to find placement outside of [facility].  Ms. Barbette Merino verbalized understanding.  Expected Discharge Plan: Skilled Nursing Facility Barriers to Discharge: Continued Medical Work up,ED SNF auth,SNF Pending bed offer   Patient Goals and CMS Choice   CMS Medicare.gov Compare Post Acute Care list provided to:: Other (Comment Required) Harless Litten (Daughter)   [phone] (Mobile)Legal Guardian) Choice offered to / list presented to : Adult Children  Expected Discharge Plan and Services Expected Discharge Plan: [place]     Post Acute Care Choice: [place]                                        Prior Living Arrangements/Services                       Activities of Daily Living       Permission Sought/Granted                  Emotional Assessment              Admission diagnosis:  fall Patient Active Problem List   Diagnosis Date Noted  . Anemia 01/12/2019  . Anxiety 01/12/2019  . Carpal tunnel syndrome 01/12/2019  . Menopausal syndrome 01/12/2019  . Personal history of allergy to insect bites and stings 01/12/2019  . Psoriasis (a type of skin inflammation) 01/12/2019  . Seasonal allergies 01/12/2019  . Bipolar affective disorder, depressed, moderate ([place]) 12/30/2018  . Suicidal ideation 11/27/2017  . Delirium due to multiple etiologies, acute, mixed level of activity 10/22/2017  . Encephalopathy 10/12/2017  . Confusion 10/04/2017  . Diabetes mellitus type 2, uncomplicated ([place]) 88/41/6606  . Fibromyalgia 10/04/2017  . Migraines 10/04/2017  . Osteoarthritis 10/04/2017  . Bipolar 1 disorder, mixed, partial remission ([place]) 12/19/2015  . Personality disorder ([place]) 12/12/2015  . Obstructive sleep apnea syndrome 06/11/2015  . PTSD (post-traumatic stress disorder) 05/09/2015  . Hypertension 05/09/2015  . Diabetes ([place]) 05/09/2015  . Bipolar disorder, current episode manic severe with psychotic features ([place]) 05/09/2015  . 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 Benign essential hypertension 01/18/2014  .  Hyperlipemia, mixed 01/18/2014  . Vitamin D deficiency 01/18/2014   PCP:  Remi Haggard, FNP Pharmacy:   [facility] [place], [address] [place] [address] Phone: [phone] Fax: [phone]     Social Determinants of Health (SDOH) Interventions    Readmission Risk Interventions No flowsheet data found.

## 2020-10-29 NOTE — ED Notes (Signed)
PT at bedside for eval.

## 2020-10-29 NOTE — Evaluation (Signed)
Physical Therapy Evaluation Patient Details Name: Kendra Nicholson MRN: [id number] DOB: [birth year] Today's Date: 10/29/2020   History of Present Illness  Patient is a  70 y.o. female coming from ALF with a decline in mental status over the past several months with  increasing confusion and requiring additional assistance with ADLs and falls reported.  CT of head with no acute intracranial abnormality. Past medical history significant for Parkinson's disease, HTN, diabetes. Found to have acute cystitis without hematuria.  Clinical Impression  PT evaluation completed. Patient is cooperative, oriented x 3, and able to follow single step commands without difficulty. Patient required minimal assistance for bed mobility, sit to stand transfers, and for ambulating a short distance in room without assistive device. Patient has impaired dynamic standing balance with history of multiple falls at ALF. Recommend to continue PT to maximize independence and address functional limitations listed below. Anticipate patient will need SNF placement at discharge for ongoing PT needs.      Follow Up Recommendations SNF    Equipment Recommendations   (to be determined at next level of care)    Recommendations for Other Services       Precautions / Restrictions Precautions Precautions: Fall      Mobility  Bed Mobility Overal bed mobility: Needs Assistance Bed Mobility: Supine to Sit;Sit to Supine     Supine to sit: Min assist Sit to supine: Min guard   General bed mobility comments: assistance for trunk support to sit uprignt on edge of bed    Transfers Overall transfer level: Needs assistance   Transfers: Sit to/from Stand Sit to Stand: Min assist         General transfer comment: Min A for standing from bed and from toilet. verbal cues for safety  Ambulation/Gait Ambulation/Gait assistance: Min assist Gait Distance (Feet): 35 Feet   Gait Pattern/deviations: Narrow base of support Gait  velocity: decreased   General Gait Details: patient ambulated with steadying assistance in the room. verbal cues for safety  Stairs            Wheelchair Mobility    Modified Rankin (Stroke Patients Only)       Balance Overall balance assessment: Needs assistance;History of Falls Sitting-balance support: Feet supported Sitting balance-Leahy Scale: Good Sitting balance - Comments: good sitting balance demonstrated   Standing balance support: No upper extremity supported;During functional activity Standing balance-Leahy Scale: Poor Standing balance comment: patient required assistance to maintain dynamic balance with loss of balance in all directions                             Pertinent Vitals/Pain Pain Assessment: No/denies pain    Home Living Family/patient expects to be discharged to:: Assisted living               Home Equipment: None Additional Comments: patient has her own room at ALF where she gets assistance for meals and presumably with medication management. Frequent falls per notes    Prior Function Level of Independence: Independent         Comments: with increased assistance recently for ADLs per chart. Frequent recent falls     Hand Dominance        Extremity/Trunk Assessment   Upper Extremity Assessment Upper Extremity Assessment: Generalized weakness    Lower Extremity Assessment Lower Extremity Assessment: Generalized weakness       Communication   Communication: No difficulties  Cognition Arousal/Alertness: Awake/alert Behavior During Therapy:  WFL for tasks assessed/performed Overall Cognitive Status: Impaired/Different from baseline Area of Impairment: Safety/judgement;Orientation                 Orientation Level: Disoriented to;Time       Safety/Judgement: Decreased awareness of safety;Decreased awareness of deficits     General Comments: patient is able to follow single step commands without  difficulty      General Comments      Exercises     Assessment/Plan    PT Assessment Patient needs continued PT services  PT Problem List Decreased strength;Decreased activity tolerance;Decreased balance;Decreased mobility;Decreased cognition;Decreased safety awareness;Decreased knowledge of precautions;Decreased knowledge of use of DME       PT Treatment Interventions DME instruction;Gait training;Functional mobility training;Therapeutic activities;Balance training;Therapeutic exercise;Neuromuscular re-education;Cognitive remediation;Patient/family education    PT Goals (Current goals can be found in the Care Plan section)  Acute Rehab PT Goals Patient Stated Goal: to have the help she needs with mobility PT Goal Formulation: With patient Time For Goal Achievement: 11/12/20 Potential to Achieve Goals: Good    Frequency Min 2X/week   Barriers to discharge Decreased caregiver support patient needs higher level of care than what ALF can provide per notes    Co-evaluation               AM-PAC PT "6 Clicks" Mobility  Outcome Measure Help needed turning from your back to your side while in a flat bed without using bedrails?: A Little Help needed moving from lying on your back to sitting on the side of a flat bed without using bedrails?: A Little Help needed moving to and from a bed to a chair (including a wheelchair)?: A Little Help needed standing up from a chair using your arms (e.g., wheelchair or bedside chair)?: A Little Help needed to walk in hospital room?: A Little Help needed climbing 3-5 steps with a railing? : A Lot 6 Click Score: 17    End of Session   Activity Tolerance: Patient tolerated treatment well Patient left: in bed;with call bell/phone within reach;with bed alarm set Nurse Communication: Mobility status PT Visit Diagnosis: Muscle weakness (generalized) (M62.81);Unsteadiness on feet (R26.81);History of falling (Z91.81)    Time: 2641-5830 PT  Time Calculation (min) (ACUTE ONLY): 17 min   Charges:   PT Evaluation $PT Eval Moderate Complexity: 1 Mod PT Treatments $Therapeutic Activity: 8-22 mins        Minna Merritts, PT, MPT   Percell Locus 10/29/2020, 4:13 PM

## 2020-10-29 NOTE — Progress Notes (Signed)
SLP Cancellation Note  Patient Details Name: Kendra Nicholson MRN: [id number] DOB: 1951-07-11   Cancelled treatment:       Reason Eval/Treat Not Completed:  (received order; reviewed chart and consulted NSG). Pt is in transition to her room. Will f/u w/ BSE in the morning. NSG agreed and will monitor first oral intake when in room; aspiration precautions recommended.     Orinda Kenner, MS, CCC-SLP Speech Language Pathologist Rehab Services [phone] [facility] 10/29/2020, 6:05 PM

## 2020-10-30 DIAGNOSIS — Z91040 Latex allergy status: Secondary | ICD-10-CM | POA: Diagnosis not present

## 2020-10-30 DIAGNOSIS — Z91013 Allergy to seafood: Secondary | ICD-10-CM | POA: Diagnosis not present

## 2020-10-30 DIAGNOSIS — F3132 Bipolar disorder, current episode depressed, moderate: Secondary | ICD-10-CM | POA: Diagnosis not present

## 2020-10-30 DIAGNOSIS — Z20822 Contact with and (suspected) exposure to covid-19: Secondary | ICD-10-CM | POA: Diagnosis present

## 2020-10-30 DIAGNOSIS — E440 Moderate protein-calorie malnutrition: Secondary | ICD-10-CM | POA: Diagnosis present

## 2020-10-30 DIAGNOSIS — G9341 Metabolic encephalopathy: Secondary | ICD-10-CM | POA: Diagnosis present

## 2020-10-30 DIAGNOSIS — Z91041 Radiographic dye allergy status: Secondary | ICD-10-CM | POA: Diagnosis not present

## 2020-10-30 DIAGNOSIS — Z888 Allergy status to other drugs, medicaments and biological substances status: Secondary | ICD-10-CM | POA: Diagnosis not present

## 2020-10-30 DIAGNOSIS — R296 Repeated falls: Secondary | ICD-10-CM | POA: Diagnosis present

## 2020-10-30 DIAGNOSIS — I10 Essential (primary) hypertension: Secondary | ICD-10-CM | POA: Diagnosis present

## 2020-10-30 DIAGNOSIS — Z8719 Personal history of other diseases of the digestive system: Secondary | ICD-10-CM | POA: Diagnosis not present

## 2020-10-30 DIAGNOSIS — E785 Hyperlipidemia, unspecified: Secondary | ICD-10-CM | POA: Diagnosis present

## 2020-10-30 DIAGNOSIS — R627 Adult failure to thrive: Secondary | ICD-10-CM | POA: Diagnosis present

## 2020-10-30 DIAGNOSIS — R54 Age-related physical debility: Secondary | ICD-10-CM | POA: Diagnosis present

## 2020-10-30 DIAGNOSIS — F0280 Dementia in other diseases classified elsewhere without behavioral disturbance: Secondary | ICD-10-CM | POA: Diagnosis present

## 2020-10-30 DIAGNOSIS — F4310 Post-traumatic stress disorder, unspecified: Secondary | ICD-10-CM | POA: Diagnosis present

## 2020-10-30 DIAGNOSIS — N3000 Acute cystitis without hematuria: Secondary | ICD-10-CM | POA: Diagnosis present

## 2020-10-30 DIAGNOSIS — Z91030 Bee allergy status: Secondary | ICD-10-CM | POA: Diagnosis not present

## 2020-10-30 DIAGNOSIS — Z882 Allergy status to sulfonamides status: Secondary | ICD-10-CM | POA: Diagnosis not present

## 2020-10-30 DIAGNOSIS — G9340 Encephalopathy, unspecified: Secondary | ICD-10-CM | POA: Diagnosis not present

## 2020-10-30 DIAGNOSIS — G20 Parkinson's disease: Secondary | ICD-10-CM | POA: Diagnosis present

## 2020-10-30 DIAGNOSIS — Z6824 Body mass index (BMI) 24.0-24.9, adult: Secondary | ICD-10-CM | POA: Diagnosis not present

## 2020-10-30 DIAGNOSIS — F3177 Bipolar disorder, in partial remission, most recent episode mixed: Secondary | ICD-10-CM | POA: Diagnosis present

## 2020-10-30 DIAGNOSIS — G4733 Obstructive sleep apnea (adult) (pediatric): Secondary | ICD-10-CM | POA: Diagnosis present

## 2020-10-30 DIAGNOSIS — E119 Type 2 diabetes mellitus without complications: Secondary | ICD-10-CM | POA: Diagnosis present

## 2020-10-30 DIAGNOSIS — M1990 Unspecified osteoarthritis, unspecified site: Secondary | ICD-10-CM | POA: Diagnosis present

## 2020-10-30 LAB — GASTROINTESTINAL PANEL BY PCR, STOOL (REPLACES STOOL CULTURE)

## 2020-10-30 LAB — BASIC METABOLIC PANEL
Anion gap: 6 (ref 5–15)
BUN: 11 mg/dL (ref 8–23)
CO2: 24 mmol/L (ref 22–32)
Calcium: 9.1 mg/dL (ref 8.9–10.3)
Chloride: 110 mmol/L (ref 98–111)
Creatinine, Ser: 0.63 mg/dL (ref 0.44–1.00)
GFR, Estimated: >60 mL/min (ref >60)
Glucose, Bld: 105 mg/dL — ABNORMAL HIGH (ref 70–99)
Potassium: 3.9 mmol/L (ref 3.5–5.1)
Sodium: 140 mmol/L (ref 135–145)

## 2020-10-30 LAB — CBC
HCT: 33.5 % — ABNORMAL LOW (ref 36.0–46.0)
Hemoglobin: 11.1 g/dL — ABNORMAL LOW (ref 12.0–15.0)
MCH: 30.7 pg (ref 26.0–34.0)
MCHC: 33.1 g/dL (ref 30.0–36.0)
MCV: 92.8 fL (ref 80.0–100.0)
Platelets: 248 10*3/uL (ref 150–400)
RBC: 3.61 MIL/uL — ABNORMAL LOW (ref 3.87–5.11)
RDW: 12.5 % (ref 11.5–15.5)
WBC: 6 10*3/uL (ref 4.0–10.5)
nRBC: 0 % (ref 0.0–0.2)

## 2020-10-30 LAB — SARS CORONAVIRUS 2 (TAT 6-24 HRS): SARS Coronavirus 2: NEGATIVE

## 2020-10-30 LAB — HIV ANTIBODY (ROUTINE TESTING W REFLEX): HIV Screen 4th Generation wRfx: NONREACTIVE

## 2020-10-30 LAB — C DIFFICILE QUICK SCREEN W PCR REFLEX
C Diff antigen: NEGATIVE
C Diff interpretation: NOT DETECTED
C Diff toxin: NEGATIVE

## 2020-10-30 LAB — LITHIUM LEVEL: Lithium Lvl: 0.62 mmol/L (ref 0.60–1.20)

## 2020-10-30 MED ORDER — CARBIDOPA-LEVODOPA 25-100 MG PO TABS
1.0000 | ORAL_TABLET | Freq: Three times a day (TID) | ORAL | Status: DC
  Administered 2020-10-30 – 2020-11-02 (×10): 1 via ORAL
  Filled 2020-10-30 (×13): qty 1

## 2020-10-30 MED ORDER — ADULT MULTIVITAMIN W/MINERALS CH
1.0000 | ORAL_TABLET | Freq: Every day | ORAL | Status: DC
  Administered 2020-10-31 – 2020-11-02 (×3): 1 via ORAL
  Filled 2020-10-30 (×3): qty 1

## 2020-10-30 MED ORDER — ENSURE ENLIVE PO LIQD
237.0000 mL | Freq: Two times a day (BID) | ORAL | Status: DC
  Administered 2020-10-31 – 2020-11-02 (×4): 237 mL via ORAL

## 2020-10-30 NOTE — TOC Progression Note (Signed)
Transition of Care [facility]) - Progression Note    Patient Details  Name: Kendra Nicholson MRN: [id number] Date of Birth: 07/25/1951  Transition [facility]) CM/SW Contact  Shelbie Hutching, RN Phone Number: 10/30/2020, 4:37 PM  Clinical Narrative:    RNCM spoke with patient's daughter earlier today.  Daughter is okay with SNF for short term rehab but does believe that she will need long term care and would be agreeable to the patient transitioning to long term skilled care after rehab.  Bed search started.  Passr pending.  No bed offers at this time.   Expected Discharge Plan: [place] Barriers to Discharge: Continued Medical Work up,ED SNF auth,SNF Pending bed offer  Expected Discharge Plan and Services Expected Discharge Plan: [place]     Post Acute Care Choice: [place]                                         Social Determinants of Health (SDOH) Interventions    Readmission Risk Interventions No flowsheet data found.

## 2020-10-30 NOTE — Plan of Care (Signed)
  Problem: Education: Goal: Knowledge of General Education information will improve Description: Including pain rating scale, medication(s)/side effects and non-pharmacologic comfort measures Outcome: Progressing   Problem: Coping: Goal: Level of anxiety will decrease Outcome: Progressing   Problem: Safety: Goal: Ability to remain free from injury will improve Outcome: Progressing   Problem: Skin Integrity: Goal: Risk for impaired skin integrity will decrease Outcome: Progressing   Problem: Urinary Elimination: Goal: Signs and symptoms of infection will decrease Outcome: Progressing   Problem: Activity: Goal: Risk for activity intolerance will decrease Outcome: Not Progressing Note: Pt with continued weakness. Needing total assist for all ADL's

## 2020-10-30 NOTE — Progress Notes (Addendum)
Initial Nutrition Assessment  DOCUMENTATION CODES:   Non-severe (moderate) malnutrition in context of chronic illness  INTERVENTION:  Provide Ensure Enlive po BID, each supplement provides 350 kcal and 20 grams of protein. Patient prefers chocolate.  Provide MVI po daily.  NUTRITION DIAGNOSIS:   Moderate Malnutrition related to chronic illness (Parkinson's disease) as evidenced by mild fat depletion,mild muscle depletion,moderate muscle depletion.  GOAL:   Patient will meet greater than or equal to 90% of their needs  MONITOR:   PO intake,Supplement acceptance,Labs,Weight trends,I & O's  REASON FOR ASSESSMENT:   Consult Assessment of nutrition requirement/status  ASSESSMENT:   70 year old female with PMHx of Parkinson's disease, DM, anxiety, depression, HTN, sleep apnea, PTSD, fibromyalgia, HLD, arthritis, psoriasis admitted with poor PO intake, frequent falls.   Met with patient at bedside. No family members present at time of RD assessment. Patient reports her appetite is good. She reports she eats 2-3 meals per day and finishes about 75% of her meals. Per chart there are reports of poor PO intake PTA. No meal documentation available yet from this admission. Patient had eaten most of her breakfast this morning. She is amenable to drinking ONS to help meet calorie/protein needs. She reports she likes Ensure and Boost and prefers chocolate flavor.  Patient reports her UBW is 146 lbs and that she is weight-stable. Currently documented to be 68 kg (149.91 lbs). Appears to be fairly weight stable since 2018.  Medications reviewed and include: metformin 1000 mg BID, ceftriaxone.  Labs reviewed.  NUTRITION - FOCUSED PHYSICAL EXAM:  Flowsheet Row Most Recent Value  Orbital Region Mild depletion  Upper Arm Region Mild depletion  Thoracic and Lumbar Region No depletion  Buccal Region Mild depletion  Temple Region Mild depletion  Clavicle Bone Region No depletion  Clavicle and  Acromion Bone Region No depletion  Scapular Bone Region No depletion  Dorsal Hand Mild depletion  Patellar Region Moderate depletion  Anterior Thigh Region Moderate depletion  Posterior Calf Region Moderate depletion  Edema (RD Assessment) None  Hair Reviewed  Eyes Reviewed  Mouth Reviewed  Skin Reviewed  Nails Reviewed     Diet Order:   Diet Order            DIET DYS 3 Room service appropriate? Yes with Assist; Fluid consistency: Thin  Diet effective now                EDUCATION NEEDS:   No education needs have been identified at this time  Skin:  Skin Assessment: Reviewed RN Assessment  Last BM:  10/30/2020 - large type 7  Height:   Ht Readings from Last 1 Encounters:  10/29/20 5' 6"  (1.676 m)   Weight:   Wt Readings from Last 1 Encounters:  10/29/20 68 kg   BMI:  Body mass index is 24.2 kg/m.  Estimated Nutritional Needs:   Kcal:  1700-1900  Protein:  85-95 grams  Fluid:  1.7-1.9 L/day  Jacklynn Barnacle, MS, RD, LDN Pager number available on Amion

## 2020-10-30 NOTE — NC FL2 (Signed)
[place] LEVEL OF CARE SCREENING TOOL     IDENTIFICATION  Patient Name: Kendra Nicholson Birthdate: 03/16/1951 Sex: female Admission Date (Current Location): 10/29/2020  [place] and [place] Number:  Engineering geologist and Address:  [facility], [address], [place], [place] [address]      Provider Number: [id number]  Attending Physician Name and Address:  Barb Merino, MD  Relative Name and Phone Number:  Felipa Evener (daughter) [phone]    Current Level of Care: Hospital Recommended Level of Care: [place] Prior Approval Number:    Date Approved/Denied:   PASRR Number: Pending  Discharge Plan: SNF    Current Diagnoses: Patient Active Problem List   Diagnosis Date Noted  . Malnutrition of moderate degree 10/30/2020  . Failure to thrive in adult 10/29/2020  . UTI (urinary tract infection) 10/29/2020  . Poor appetite 10/29/2020  . Anemia 01/12/2019  . Anxiety 01/12/2019  . Carpal tunnel syndrome 01/12/2019  . Menopausal syndrome 01/12/2019  . Personal history of allergy to insect bites and stings 01/12/2019  . Psoriasis (a type of skin inflammation) 01/12/2019  . Seasonal allergies 01/12/2019  . Bipolar affective disorder, depressed, moderate ([place]) 12/30/2018  . Suicidal ideation 11/27/2017  . Delirium due to multiple etiologies, acute, mixed level of activity 10/22/2017  . Encephalopathy 10/12/2017  . Confusion 10/04/2017  . Diabetes mellitus type 2, uncomplicated ([place]) 81/44/8185  . Fibromyalgia 10/04/2017  . Migraines 10/04/2017  . Osteoarthritis 10/04/2017  . Bipolar 1 disorder, mixed, partial remission ([place]) 12/19/2015  . Personality disorder ([place]) 12/12/2015  . Obstructive sleep apnea syndrome 06/11/2015  . PTSD (post-traumatic stress disorder) 05/09/2015  . Hypertension 05/09/2015  . Diabetes ([place]) 05/09/2015  . Bipolar disorder, current episode manic severe with psychotic features  ([place]) 05/09/2015  . 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 Benign essential hypertension 01/18/2014  . Hyperlipemia, mixed 01/18/2014  . Vitamin D deficiency 01/18/2014    Orientation RESPIRATION BLADDER Height & Weight     Self,Place  Normal Incontinent Weight: 68 kg Height:  5\' 6"  (167.6 cm)  BEHAVIORAL SYMPTOMS/MOOD NEUROLOGICAL BOWEL NUTRITION STATUS      Incontinent Diet (Dysphagia diet 3 Heart Healthy carb modified- with assistance- thin liquids)  AMBULATORY STATUS COMMUNICATION OF NEEDS Skin   Extensive Assist Verbally Bruising,Skin abrasions                       Personal Care Assistance Level of Assistance  Bathing,Feeding,Dressing Bathing Assistance: Maximum assistance Feeding assistance: Limited assistance Dressing Assistance: Maximum assistance     Functional Limitations Info             SPECIAL CARE FACTORS FREQUENCY  PT (By licensed PT),OT (By licensed OT)     PT Frequency: 5 times per week OT Frequency: 5 times per week            Contractures Contractures Info: Not present    Additional Factors Info  Code Status,Allergies Code Status Info: Full Allergies Info: Bee venom, demerol, IVP dye, shellfish, sulfa, adhesive tape, iodine, latex           Current Medications (10/30/2020):  This is the current hospital active medication list Current Facility-Administered Medications  Medication Dose Route Frequency Provider Last Rate Last Admin  . acetaminophen (TYLENOL) tablet 325 mg  325 mg Oral Q6H PRN Cox, Amy N, DO       Or  . acetaminophen (TYLENOL) suppository 325 mg  325 mg Rectal Q6H PRN  Cox, Amy N, DO      . amLODipine (NORVASC) tablet 10 mg  10 mg Oral Daily Cox, Amy N, DO   10 mg at 10/30/20 6803  . aspirin EC tablet 81 mg  81 mg Oral Daily Cox, Amy N, DO   81 mg at 10/30/20 2122  . atorvastatin (LIPITOR) tablet 40 mg  40 mg Oral q1800 Cox, Amy N, DO      . carbidopa-levodopa (SINEMET CR) 50-200 MG per tablet controlled release 1  tablet  1 tablet Oral QHS Cox, Amy N, DO   1 tablet at 10/29/20 2337  . carbidopa-levodopa (SINEMET IR) 25-100 MG per tablet immediate release 1 tablet  1 tablet Oral TID Barb Merino, MD   1 tablet at 10/30/20 0921  . carvedilol (COREG) tablet 25 mg  25 mg Oral BID WC Cox, Amy N, DO   25 mg at 10/30/20 0920  . cefTRIAXone (ROCEPHIN) 1 g in sodium chloride 0.9 % 100 mL IVPB  1 g Intravenous Q24H Cox, Amy N, DO   Stopped at 10/30/20 1022  . enoxaparin (LOVENOX) injection 40 mg  40 mg Subcutaneous Q24H Cox, Amy N, DO   40 mg at 10/29/20 2147  . [START ON 10/31/2020] feeding supplement (ENSURE ENLIVE / ENSURE PLUS) liquid 237 mL  237 mL Oral BID BM Barb Merino, MD      . lamoTRIgine (LAMICTAL) tablet 50 mg  50 mg Oral BID Cox, Amy N, DO   50 mg at 10/30/20 0921  . lithium carbonate capsule 300 mg  300 mg Oral BID Cox, Amy N, DO   300 mg at 10/30/20 4825  . metFORMIN (GLUCOPHAGE) tablet 1,000 mg  1,000 mg Oral BID WC Cox, Amy N, DO   1,000 mg at 10/30/20 0920  . [START ON 10/31/2020] multivitamin with minerals tablet 1 tablet  1 tablet Oral Daily Ghimire, Dante Gang, MD      . ondansetron (ZOFRAN) tablet 4 mg  4 mg Oral Q6H PRN Cox, Amy N, DO       Or  . ondansetron (ZOFRAN) injection 4 mg  4 mg Intravenous Q6H PRN Cox, Amy N, DO      . QUEtiapine (SEROQUEL XR) 24 hr tablet 200 mg  200 mg Oral QHS Cox, Amy N, DO   200 mg at 10/29/20 2337  . QUEtiapine (SEROQUEL) tablet 50 mg  50 mg Oral TID Cox, Amy N, DO   50 mg at 10/30/20 0920  . sertraline (ZOLOFT) tablet 100 mg  100 mg Oral Daily Cox, Amy N, DO   100 mg at 10/30/20 0920  . Valbenazine Tosylate CAPS 80 mg  80 mg Oral Daily Cox, Amy N, DO         Discharge Medications: Please see discharge summary for a list of discharge medications.  Relevant Imaging Results:  Relevant Lab Results:   Additional Information SS# [id number]  Shelbie Hutching, RN

## 2020-10-30 NOTE — Plan of Care (Signed)
?  Problem: Coping: ?Goal: Level of anxiety will decrease ?Outcome: Progressing ?  ?Problem: Safety: ?Goal: Ability to remain free from injury will improve ?Outcome: Progressing ?  ?

## 2020-10-30 NOTE — Progress Notes (Signed)
Verified with pt's legal guardian at the 1C nurse's station that the pt's upper dentures do not fit her mouth therefore, she has been without for as long as she can remember

## 2020-10-30 NOTE — Evaluation (Addendum)
Clinical/Bedside Swallow Evaluation Patient Details  Name: Kendra Nicholson MRN: [id number] Date of Birth: 05-19-1951  Today's Date: 10/30/2020 Time: SLP Start Time (ACUTE ONLY): 0804 SLP Stop Time (ACUTE ONLY): 0850 SLP Time Calculation (min) (ACUTE ONLY): 46 min  Past Medical History:  Past Medical History:  Diagnosis Date  . Anxiety   . Arthritis   . Colon polyps   . Depression   . Diabetes mellitus without complication ([place])   . Fibromyalgia   . Headache   . Hyperlipidemia   . Hypertension   . Parkinson's disease ([place])   . Psoriasis   . PTSD (post-traumatic stress disorder)   . Seasonal allergies   . Sleep apnea    Past Surgical History:  Past Surgical History:  Procedure Laterality Date  . ABDOMINAL HYSTERECTOMY    . ABDOMINAL SURGERY    . COLON SURGERY    . COLONOSCOPY    . POLYPECTOMY    . RIGHT OOPHORECTOMY    . TONSILLECTOMY     HPI:  Pt is a 70 y.o. female with medical history significant for Bipolar affective disorder, depressed, moderate; Anxiety; Bipolar disorder, current episode manic severe with psychotic features; Diabetes mellitus type 2, eseential HTN; Obstructive sleep apnea syndrome; Failure to thrive in adult, atypical Parkinson's with right hemibody parkinsonism, hypomimia, internal hemorrhoids, history of colon polyps, history of tobacco abuse, hyperlipidemia, hypertension, who presents emergency department for chief concerns of frequent falls and alterned mental status w/ FTT.  Per family, pt has had decreased p.o. intake and decline in her mental status over the past several months.  She coming from assisted living facility and they are having trouble meeting her her needs. According the daughter she has had fairly rapid decline that started several weeks ago noticing increasing confusion and requiring additional assistance with ADLs. Has had a couple falls.   Assessment / Plan / Recommendation Clinical Impression  Pt appears to present w/ grossly  adequate oropharyngeal phase swallow function in light of min+ declined Cognitive status; suspeced Dementia per MD notes at admit. Pt is also lacking Most Dentition w/out molars for proper mastication of solids. These factors impacts her overall awareness/engagement and safety during po tasks which increases risk for aspiration, choking. Pt's risk for aspiration can be reduced when following general aspiration precautions and given a slightly modified diet. She required min verbal cues and assistance w/ tray setup at meal. Pt fed herself trials of thin liquids via Cup/Straw, purees, and softened solids(for ease of mastication, gumming) w/ No overt clinical s/s of aspiration noted; no decline in vocal quality during few verbalizations, no cough, and no decline in respiratory status during/post trials. Oral phase was adequate for bolus management and oral clearing of most boluses -- pt did not appear to give proper attention to masticating solids suspect d/t missing Dentition to do so (she tended to swallow solids quickly w/out much mastication effort/time). W/ support, solids were cut SMALL and moistened and orally managed by the pt. OM Exam revealed No unilateral weakness noted.   Recommend a more Mech Soft diet (dys. level 3) w/ cut meats, moistened foods w/ Thin liquids; aspiration precautions; reduce Distractions during meals. Pills Whole in Puree for safer swallowing. Support w/ tray setup at meals. NSG updated. ST services recommends follow w/ Palliative Care for overall GOC w/ pt's Baseline of FTT. ST services will sign off at this time w/ NSG to reconsult if any new needs arise. Precautions posted in room. SLP Visit Diagnosis: Dysphagia, unspecified (R13.10) (  Cognitive impact; lacks Dentition)    Aspiration Risk  Mild aspiration risk;Risk for inadequate nutrition/hydration (reduced when following diet; precautions)    Diet Recommendation  Mech Soft w/ thin liquids(cut meats/foods moistened well d/t  Lacking Sufficient Dentition and Cognitive status); general aspiration precautions w/ po's  Medication Administration: Whole meds with puree (for safer swallowing)    Other  Recommendations Recommended Consults:  (Dietician f/u for FTT; Palliative Care for [place]) Oral Care Recommendations: Oral care BID;Oral care before and after PO;Staff/trained caregiver to provide oral care Other Recommendations:  (n/a)   Follow up Recommendations None      Frequency and Duration  (n/a)   (n/a)       Prognosis Prognosis for Safe Diet Advancement: Fair Barriers to Reach Goals: Cognitive deficits;Time post onset;Severity of deficits Barriers/Prognosis Comment: Cognitive impact; lacks Dentition      Swallow Study   General Date of Onset: 10/29/20 HPI: Pt is a 70 y.o. female with medical history significant for Bipolar affective disorder, depressed, moderate; Anxiety; Bipolar disorder, current episode manic severe with psychotic features; Diabetes mellitus type 2, eseential HTN; Obstructive sleep apnea syndrome; Failure to thrive in adult, atypical Parkinson's with right hemibody parkinsonism, hypomimia, internal hemorrhoids, history of colon polyps, history of tobacco abuse, hyperlipidemia, hypertension, who presents emergency department for chief concerns of frequent falls and alterned mental status w/ FTT.  Per family, pt has had decreased p.o. intake and decline in her mental status over the past several months.  She coming from assisted living facility and they are having trouble meeting her her needs. According the daughter she has had fairly rapid decline that started several weeks ago noticing increasing confusion and requiring additional assistance with ADLs. Has had a couple falls. Type of Study: Bedside Swallow Evaluation Previous Swallow Assessment: none Diet Prior to this Study: Regular;Thin liquids Temperature Spikes Noted: No (wbc 6.0) Respiratory Status: Room air History of Recent  Intubation: No Behavior/Cognition: Alert;Cooperative;Pleasant mood;Confused;Distractible;Requires cueing Oral Cavity Assessment: Within Functional Limits Oral Care Completed by SLP: Recent completion by staff Oral Cavity - Dentition: Missing dentition;Poor condition (missing most bottom and all upper Dentition; missing molars) Vision: Functional for self-feeding Self-Feeding Abilities: Able to feed self;Needs assist;Needs set up (min UE tremors baseline) Patient Positioning: Upright in bed (needed support w/ positioning) Baseline Vocal Quality: Normal Volitional Cough: Strong Volitional Swallow: Able to elicit    Oral/Motor/Sensory Function Overall Oral Motor/Sensory Function: Within functional limits   Ice Chips Ice chips: Within functional limits Presentation: Spoon (2 trials)   Thin Liquid Thin Liquid: Within functional limits (grossly) Presentation: Cup;Straw Other Comments: multiple swallows = audible swallows (min impulsive)    Nectar Thick Nectar Thick Liquid: Not tested   Honey Thick Honey Thick Liquid: Not tested   Puree Puree: Within functional limits Presentation: Self Fed;Spoon (4 ozs)   Solid     Solid: Impaired Presentation: Spoon;Self Fed (~20+ bites) Oral Phase Impairments: Impaired mastication (lacks most Dentition) Oral Phase Functional Implications: Impaired mastication Pharyngeal Phase Impairments:  (none) Other Comments: quick swallows of food not well chewed        Orinda Kenner, MS, [employer] Speech Language Pathologist Rehab Services [phone] [place] [facility] 10/30/2020,4:28 PM

## 2020-10-30 NOTE — Progress Notes (Signed)
PROGRESS NOTE    Kendra Nicholson  MLY:[id number] DOB: 02/22/1951 DOA: 10/29/2020 PCP: Remi Haggard, FNP    Brief Narrative:  Patient is 69 year old female with history of Parkinson's with right hemibody parkinsonism, hypertension, hyperlipidemia, depression, anxiety, non-insulin-dependent diabetes who was brought to the ER with frequent falls and acting different.  Patient lives in assisted living facility.  She was found to be more weaker and confused than usual so they sent her to ER.  At the emergency room patient was hemodynamically stable.  Urinalysis was abnormal.  Started on Rocephin and admitted to the hospital.   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  Patient was treated with Rocephin that we will continue until final cultures.  Continue supportive treatment.  Does not have any focal deficits.  Bipolar disorder and depression: On multiple Medications Patient on lithium 300 mg twice daily, lithium level was checked and reviewed and she is therapeutic.  Continue.  Patient is on Seroquel 50 mg 3 times a day and 200 mg at night that is continued.  Patient is also on sertraline 100 mg daily that is continued.  Her mental status is already improving.    Frequent falls/ deconditioning: Patient will  benefit with inpatient therapies.  Referred to SNF.  Type 2 diabetes: Well controlled.  On Metformin that is continued.  Parkinson's disease: Patient on carbidopa/levodopa.  Verified by pharmacy.  Resume home medications.   DVT prophylaxis: enoxaparin (LOVENOX) injection 40 mg Start: 10/29/20 2200 Place TED hose Start: 10/29/20 1544   Code Status: Full code Family Communication: Patient's daughter on the phone Disposition Plan: Status is: Observation  The patient will require care spanning > 2 midnights and should be moved to inpatient because: IV treatments appropriate due to intensity of illness or inability to take PO and Inpatient level of care appropriate due to severity of illness  Dispo: The patient is from: Group home              Anticipated d/c is to: SNF              Patient currently is not medically stable to d/c.   Difficult to place patient No         Consultants:   None  Procedures:   None  Antimicrobials:   Rocephin, 3/1---   Subjective: Patient was seen and examined.  She is pleasant and communicating.  Does not look confused.  She does have some cognition problems, however mostly able to have pleasant conversation.  She does not know she is in the hospital.  Objective: Vitals:   10/30/20 0131 10/30/20 0500 10/30/20 0823 10/30/20 1109  BP: (!) 138/49 (!) 102/47 121/60 (!) 122/52  Pulse: 62 63 61 65  Resp: 18 12 18 17   Temp: 98.8 F (37.1 C) 97.8 F (36.6 C) 98.3 F (36.8  C) 98.7 F (37.1 C)  TempSrc: Oral  Oral Oral  SpO2: 99% 96% 97% 98%  Weight:      Height:        Intake/Output Summary (Last 24 hours) at 10/30/2020 1458 Last data filed at 10/30/2020 1300 Gross per 24 hour  Intake 720 ml  Output --  Net 720 ml   Filed Weights   10/29/20 1038  Weight: 68 kg    Examination:  General exam: Chronically sick looking.  Looks frail and debilitated.  Not in any discomfort. Respiratory system: Clear to auscultation. Respiratory effort  normal.  No added sounds. Cardiovascular system: S1 & S2 heard, RRR. No JVD, murmurs, rubs, gallops or clicks. No pedal edema. Gastrointestinal system: Abdomen is nondistended, soft and nontender. No organomegaly or masses felt. Normal bowel sounds heard. Central nervous system: Alert and oriented x1.  She is only oriented to herself. She moves all extremities but has generalized weakness. Her mood is normal.  Affect is flat.   Data Reviewed: I have personally reviewed following labs and imaging studies  CBC: Recent Labs  Lab 10/29/20 1122 10/30/20 0645  WBC 7.5 6.0  NEUTROABS 5.5  --   HGB 12.6 11.1*  HCT 37.5 33.5*  MCV 92.8 92.8  PLT 162 176   Basic Metabolic Panel: Recent Labs  Lab 10/29/20 1122 10/30/20 0645  NA 137 140  K 4.1 3.9  CL 106 110  CO2 21* 24  GLUCOSE 119* 105*  BUN 8 11  CREATININE 0.55 0.63  CALCIUM 9.5 9.1   GFR: Estimated Creatinine Clearance: 61.3 mL/min (by C-G formula based on SCr of 0.63 mg/dL). Liver Function Tests: Recent Labs  Lab 10/29/20 1122  AST 15  ALT <5  ALKPHOS 43  BILITOT 0.7  PROT 7.0  ALBUMIN 4.2   No results for input(s): LIPASE, AMYLASE in the last 168 hours. No results for input(s): AMMONIA in the last 168 hours. Coagulation Profile: No results for input(s): INR, PROTIME in the last 168 hours. Cardiac Enzymes: No results for input(s): CKTOTAL, CKMB, CKMBINDEX, TROPONINI in the last 168 hours. BNP (last 3 results) No results for input(s): PROBNP in the last 8760 hours. HbA1C: No results for input(s): HGBA1C in the last 72 hours. CBG: No results for input(s): GLUCAP in the last 168 hours. Lipid Profile: No results for input(s): CHOL, HDL, LDLCALC, TRIG, CHOLHDL, LDLDIRECT in the last 72 hours. Thyroid Function Tests: Recent Labs    10/29/20 1122  TSH 2.762   Anemia Panel: No results for input(s): VITAMINB12, FOLATE, FERRITIN, TIBC, IRON, RETICCTPCT in the last 72 hours. Sepsis Labs: No results for  input(s): PROCALCITON, LATICACIDVEN in the last 168 hours.  Recent Results (from the past 240 hour(s))  SARS CORONAVIRUS 2 (TAT 6-24 HRS) Nasopharyngeal Nasopharyngeal Swab     Status: None   Collection Time: 10/29/20  3:45 PM   Specimen: Nasopharyngeal Swab  Result Value Ref Range Status   SARS Coronavirus 2 NEGATIVE NEGATIVE Final    Comment: (NOTE) SARS-CoV-2 target nucleic acids are NOT DETECTED.  The SARS-CoV-2 RNA is generally detectable in upper and lower respiratory specimens during the acute phase of infection. Negative results do not preclude SARS-CoV-2 infection, do not rule out co-infections with other pathogens, and should not be used as the sole basis for treatment or other patient management decisions. Negative results must be combined with clinical observations, patient history, and epidemiological information. The expected result is Negative.  Fact Sheet for Patients: SugarRoll.be  Fact Sheet for Healthcare  Providers: https://www.woods-mathews.com/  This test is not yet approved or cleared by the [place] and  has been authorized for detection and/or diagnosis of SARS-CoV-2 by FDA under an Emergency Use Authorization (EUA). This EUA will remain  in effect (meaning this test can be used) for the duration of the COVID-19 declaration under Se ction 564(b)(1) of the Act, 21 U.S.C. section 360bbb-3(b)(1), unless the authorization is terminated or revoked sooner.  Performed at [facility], [place] [address], [place], [place] [address]   C Difficile Quick Screen w PCR reflex     Status: None   Collection Time: 10/30/20  2:58 AM   Specimen: STOOL  Result Value Ref Range Status   C Diff antigen NEGATIVE NEGATIVE Final   C Diff toxin NEGATIVE NEGATIVE Final   C Diff interpretation No C. difficile detected.  Final    Comment: Performed at [facility], [address] [place], [place] [address]   Gastrointestinal Panel by PCR , Stool     Status: None   Collection Time: 10/30/20  2:58 AM   Specimen: Stool  Result Value Ref Range Status   Campylobacter species NOT DETECTED NOT DETECTED Final   Plesimonas shigelloides NOT DETECTED NOT DETECTED Final   Salmonella species NOT DETECTED NOT DETECTED Final   Yersinia enterocolitica NOT DETECTED NOT DETECTED Final   Vibrio species NOT DETECTED NOT DETECTED Final   Vibrio cholerae NOT DETECTED NOT DETECTED Final   Enteroaggregative E coli (EAEC) NOT DETECTED NOT DETECTED Final   Enteropathogenic E coli (EPEC) NOT DETECTED NOT DETECTED Final   Enterotoxigenic E coli (ETEC) NOT DETECTED NOT DETECTED Final   Shiga like toxin producing E coli (STEC) NOT DETECTED NOT DETECTED Final   Shigella/Enteroinvasive E coli (EIEC) NOT DETECTED NOT DETECTED Final   Cryptosporidium NOT DETECTED NOT DETECTED Final   Cyclospora cayetanensis NOT DETECTED NOT DETECTED Final   Entamoeba histolytica NOT DETECTED NOT DETECTED Final   Giardia lamblia NOT DETECTED NOT DETECTED Final   Adenovirus F40/41 NOT DETECTED NOT DETECTED Final   Astrovirus NOT DETECTED NOT DETECTED Final   Norovirus GI/GII NOT DETECTED NOT DETECTED Final   Rotavirus A NOT DETECTED NOT DETECTED Final   Sapovirus (I, II, IV, and V) NOT DETECTED NOT DETECTED Final    Comment: Performed at [facility], [address] [place], [place] [address]         Radiology Studies: CT Head Wo Contrast  Result Date: 10/29/2020 CLINICAL DATA:  Frequent falls EXAM: CT HEAD WITHOUT CONTRAST TECHNIQUE: Contiguous axial images were obtained from the base of the skull through the vertex without intravenous contrast. COMPARISON:  10/01/2017 FINDINGS: Brain: There is atrophy and chronic small vessel disease changes. No acute intracranial abnormality. Specifically, no hemorrhage, hydrocephalus, mass lesion, acute infarction, or significant intracranial injury. Vascular: No hyperdense vessel or  unexpected calcification. Skull: No acute calvarial abnormality. Sinuses/Orbits: Visualized paranasal sinuses and mastoids clear. Orbital soft tissues unremarkable. Other: None IMPRESSION: Atrophy, chronic microvascular disease. No acute intracranial abnormality. Electronically Signed   By: Rolm Baptise M.D.   On: 10/29/2020 11:27   CT Cervical Spine Wo Contrast  Result Date: 10/29/2020 CLINICAL DATA:  Frequent falls EXAM: CT CERVICAL SPINE WITHOUT CONTRAST TECHNIQUE: Multidetector CT imaging of the cervical spine was performed without intravenous contrast. Multiplanar CT image reconstructions were also generated. COMPARISON:  None. FINDINGS: Alignment: No subluxation. Skull base and vertebrae: No acute fracture. No primary bone lesion or focal pathologic process. Soft tissues and spinal  canal: No prevertebral fluid or swelling. No visible canal hematoma. Disc levels: Diffuse degenerative facet disease bilaterally, left greater than right. Early anterior spurring. Disc spaces maintained. Upper chest: No acute findings Other: None IMPRESSION: Degenerative facet disease bilaterally, left greater than right. No acute bony abnormality. Electronically Signed   By: Rolm Baptise M.D.   On: 10/29/2020 11:28   DG Chest Portable 1 View  Result Date: 10/29/2020 CLINICAL DATA:  Weakness, bilateral hand pain.  Fall. EXAM: PORTABLE CHEST 1 VIEW COMPARISON:  10/01/2017 FINDINGS: Heart and mediastinal contours are within normal limits. No focal opacities or effusions. No acute bony abnormality. Aortic atherosclerosis. IMPRESSION: No active cardiopulmonary disease. Electronically Signed   By: Rolm Baptise M.D.   On: 10/29/2020 11:28        Scheduled Meds: . amLODipine  10 mg Oral Daily  . aspirin EC  81 mg Oral Daily  . atorvastatin  40 mg Oral q1800  . carbidopa-levodopa  1 tablet Oral QHS  . carbidopa-levodopa  1 tablet Oral TID  . carvedilol  25 mg Oral BID WC  . enoxaparin (LOVENOX) injection  40 mg  Subcutaneous Q24H  . [START ON 10/31/2020] feeding supplement  237 mL Oral BID BM  . lamoTRIgine  50 mg Oral BID  . lithium carbonate  300 mg Oral BID  . metFORMIN  1,000 mg Oral BID WC  . [START ON 10/31/2020] multivitamin with minerals  1 tablet Oral Daily  . QUEtiapine  200 mg Oral QHS  . QUEtiapine  50 mg Oral TID  . sertraline  100 mg Oral Daily  . Valbenazine Tosylate  80 mg Oral Daily   Continuous Infusions: . cefTRIAXone (ROCEPHIN)  IV Stopped (10/30/20 1022)     LOS: 0 days    Time spent: 30 minutes    Barb Merino, MD [facility] Pager [phone]

## 2020-10-31 DIAGNOSIS — G9340 Encephalopathy, unspecified: Secondary | ICD-10-CM

## 2020-10-31 DIAGNOSIS — F3132 Bipolar disorder, current episode depressed, moderate: Secondary | ICD-10-CM

## 2020-10-31 MED ORDER — MELATONIN 5 MG PO TABS
5.0000 mg | ORAL_TABLET | Freq: Every day | ORAL | Status: DC
  Administered 2020-10-31 – 2020-11-01 (×2): 5 mg via ORAL
  Filled 2020-10-31 (×3): qty 1

## 2020-10-31 NOTE — TOC Progression Note (Signed)
Transition of Care [facility]) - Progression Note    Patient Details  Name: Kendra Nicholson MRN: [id number] Date of Birth: December 20, 1950  Transition of Care [facility]) CM/SW Contact  Shelbie Hutching, RN Phone Number: 10/31/2020, 12:54 PM  Clinical Narrative:    2 bed offers from [facility] and the [facility] in [place] reviewed with patient's daughter [place].  Hollie chooses the [facility], they also have a memory care unit.  [facility] can take the patient tomorrow even with the pending [place].    Expected Discharge Plan: [place] Barriers to Discharge: Continued Medical Work up,ED SNF auth,SNF Pending bed offer  Expected Discharge Plan and Services Expected Discharge Plan: [place]     Post Acute Care Choice: [place]                                         Social Determinants of Health (SDOH) Interventions    Readmission Risk Interventions No flowsheet data found.

## 2020-10-31 NOTE — Care Management Important Message (Signed)
Important Message  Patient Details  Name: Kendra Nicholson MRN: [id number] Date of Birth: 09/20/1950   Medicare Important Message Given:  N/A - LOS <3 / Initial given by admissions  Initial Medicare IM reviewed with patient's daughter, Harless Litten, by Meredith Mody, Patient Access Associate on 10/31/2020 at 11:23am.    Dannette Barbara 10/31/2020, 7:43 PM

## 2020-10-31 NOTE — Progress Notes (Signed)
PROGRESS NOTE    Kendra Nicholson  QAS:[id number] DOB: [birth year] DOA: 10/29/2020 PCP: Remi Haggard, FNP    Brief Narrative:  Patient is 70 year old female with history of Parkinson's with right hemibody parkinsonism, hypertension, hyperlipidemia, depression, anxiety, non-insulin-dependent diabetes who was brought to the ER with frequent falls and acting different.  Patient lives in assisted living facility.  She was found to be more weaker and confused than usual so they sent her to ER.  At the emergency room patient was hemodynamically stable.  Urinalysis was abnormal.  Started on Rocephin and admitted to the hospital.   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  Patient is with advanced Parkinson's disease and dementia.  CT head was normal.  Currently without any new neurological deficit.  Mental status at baseline.  Acute UTI present on admission: Suspected.  Treated with Rocephin.  Unfortunately, urine cultures were not collected on admission.  Since patient is clinically improved, will treat with 3 days of IV Rocephin and then discontinue.  Bipolar disorder and depression: On multiple Medications Patient on lithium 300 mg twice daily, lithium level was checked and reviewed and she is therapeutic.  Continue.   Patient is on Seroquel 50 mg 3 times a day and 200 mg at night that is continued.  Patient is also on sertraline 100 mg daily that is continued.  Her mental status is already improving.    Frequent falls/ deconditioning: Patient will benefit with inpatient therapies.  Referred to SNF.  Type 2 diabetes: Well controlled.  On Metformin that is continued.  Parkinson's disease: Patient on carbidopa/levodopa.  Verified by pharmacy.  Resume home medications.   DVT prophylaxis: enoxaparin (LOVENOX) injection 40 mg Start: 10/29/20 2200 Place TED hose Start: 10/29/20 1544   Code Status: Full code Family Communication: Patient's daughter on the phone 3/2. Disposition Plan: Status is: Inpatient.  The patient will require care spanning > 2 midnights and should be moved to inpatient because: IV treatments appropriate due to intensity of illness or inability to take PO and Inpatient level of care appropriate due to severity of illness  Dispo: The patient is from: Group home              Anticipated d/c is to: SNF              Patient currently is not medically stable to d/c.   Difficult to place patient No         Consultants:   None  Procedures:   None  Antimicrobials:   Rocephin, 3/1---   Subjective: Patient seen and examined.  She is very pleasant today.  She recalls being a Education officer, museum at [employer] and tells me about her life.  She does have significant memory deficit but she is very aware about her Parkinson's and having dementia.  No other overnight events.  Afebrile.  Objective: Vitals:   10/30/20 2313 10/31/20 0326 10/31/20 0751 10/31/20 1238  BP: 116/63 (!) 110/57 (!) 120/50 (!) 107/45  Pulse: 66 (!) 56 61 (!) 59  Resp: 16 16 15 14   Temp: 98.5 F (36.9 C) 97.8 F (36.6 C) 97.7 F (36.5 C) (!) 97.3 F (36.3 C)  TempSrc:   Oral Oral  SpO2: 96% 97% 100% 100%  Weight:      Height:        Intake/Output Summary (Last 24 hours) at 10/31/2020 1352 Last data filed at  10/31/2020 1051 Gross per 24 hour  Intake 960 ml  Output --  Net 960 ml   Filed Weights   10/29/20 1038  Weight: 68 kg    Examination:  General: Patient is chronically sick looking.  Comfortable on room air. Cardiovascular: S1-S2 normal.  No added sounds. Respiratory: Bilateral clear. Gastrointestinal: Soft, nontender.  Bowel sounds present. Ext: Generalized weakness all extremities.  No edema or cyanosis. Neuro: Patient is alert awake and oriented x1.  She is pleasant and composed.  Has cognitive deficits.    Data Reviewed: I have personally reviewed following labs and imaging studies  CBC: Recent Labs  Lab 10/29/20 1122 10/30/20 0645  WBC 7.5 6.0  NEUTROABS 5.5  --   HGB 12.6 11.1*  HCT 37.5 33.5*  MCV 92.8 92.8  PLT 162 765   Basic Metabolic Panel: Recent Labs  Lab 10/29/20 1122 10/30/20 0645  NA 137 140  K 4.1 3.9  CL 106 110  CO2 21* 24  GLUCOSE 119* 105*  BUN 8 11  CREATININE 0.55 0.63  CALCIUM 9.5 9.1   GFR: Estimated Creatinine Clearance: 61.3 mL/min (by C-G formula based on SCr of 0.63 mg/dL). Liver Function Tests: Recent Labs  Lab 10/29/20 1122  AST 15  ALT <5  ALKPHOS 43  BILITOT 0.7  PROT 7.0  ALBUMIN 4.2   No results for input(s): LIPASE, AMYLASE in the last 168 hours. No results for input(s): AMMONIA in the last 168 hours. Coagulation Profile: No results for input(s): INR, PROTIME in the last 168 hours. Cardiac Enzymes: No results for input(s): CKTOTAL, CKMB, CKMBINDEX, TROPONINI in the last 168 hours. BNP (last 3 results) No results for input(s): PROBNP in the last 8760 hours. HbA1C: No results for input(s): HGBA1C in the last 72 hours. CBG: No results for input(s): GLUCAP in the last 168 hours. Lipid Profile: No results for input(s): CHOL, HDL, LDLCALC, TRIG, CHOLHDL, LDLDIRECT in the last 72 hours. Thyroid Function Tests: Recent Labs    10/29/20 1122  TSH 2.762   Anemia Panel: No results for input(s): VITAMINB12,  FOLATE, FERRITIN, TIBC, IRON, RETICCTPCT in the last 72 hours. Sepsis Labs: No results for input(s): PROCALCITON, LATICACIDVEN in the last 168 hours.  Recent Results (from the past 240 hour(s))  SARS CORONAVIRUS 2 (TAT 6-24 HRS) Nasopharyngeal Nasopharyngeal Swab     Status: None   Collection Time: 10/29/20  3:45 PM   Specimen: Nasopharyngeal Swab  Result Value Ref Range Status   SARS Coronavirus 2 NEGATIVE NEGATIVE Final    Comment: (NOTE) SARS-CoV-2 target nucleic acids are NOT DETECTED.  The SARS-CoV-2 RNA is generally detectable in upper and lower respiratory specimens during the acute phase of infection. Negative results do not preclude SARS-CoV-2 infection, do not rule out co-infections with other pathogens, and should not be used as the sole basis for treatment or other patient management decisions. Negative results must be combined with clinical observations, patient history, and epidemiological  information. The expected result is Negative.  Fact Sheet for Patients: SugarRoll.be  Fact Sheet for Healthcare Providers: https://www.woods-mathews.com/  This test is not yet approved or cleared by the [place] FDA and  has been authorized for detection and/or diagnosis of SARS-CoV-2 by FDA under an Emergency Use Authorization (EUA). This EUA will remain  in effect (meaning this test can be used) for the duration of the COVID-19 declaration under Se ction 564(b)(1) of the Act, 21 U.S.C. section 360bbb-3(b)(1), unless the authorization is terminated or revoked sooner.  Performed at [facility], [place] [address], [place], [place] [address]   C Difficile Quick Screen w PCR reflex     Status: None   Collection Time: 10/30/20  2:58 AM   Specimen: STOOL  Result Value Ref Range Status   C Diff antigen NEGATIVE NEGATIVE Final   C Diff toxin NEGATIVE NEGATIVE Final   C Diff interpretation No C. difficile detected.  Final     Comment: Performed at [facility], [address] [place], [place] [address]  Gastrointestinal Panel by PCR , Stool     Status: None   Collection Time: 10/30/20  2:58 AM   Specimen: Stool  Result Value Ref Range Status   Campylobacter species NOT DETECTED NOT DETECTED Final   Plesimonas shigelloides NOT DETECTED NOT DETECTED Final   Salmonella species NOT DETECTED NOT DETECTED Final   Yersinia enterocolitica NOT DETECTED NOT DETECTED Final   Vibrio species NOT DETECTED NOT DETECTED Final   Vibrio cholerae NOT DETECTED NOT DETECTED Final   Enteroaggregative E coli (EAEC) NOT DETECTED NOT DETECTED Final   Enteropathogenic E coli (EPEC) NOT DETECTED NOT DETECTED Final   Enterotoxigenic E coli (ETEC) NOT DETECTED NOT DETECTED Final   Shiga like toxin producing E coli (STEC) NOT DETECTED NOT DETECTED Final   Shigella/Enteroinvasive E coli (EIEC) NOT DETECTED NOT DETECTED Final   Cryptosporidium NOT DETECTED NOT DETECTED Final   Cyclospora cayetanensis NOT DETECTED NOT DETECTED Final   Entamoeba histolytica NOT DETECTED NOT DETECTED Final   Giardia lamblia NOT DETECTED NOT DETECTED Final   Adenovirus F40/41 NOT DETECTED NOT DETECTED Final   Astrovirus NOT DETECTED NOT DETECTED Final   Norovirus GI/GII NOT DETECTED NOT DETECTED Final   Rotavirus A NOT DETECTED NOT DETECTED Final   Sapovirus (I, II, IV, and V) NOT DETECTED NOT DETECTED Final    Comment: Performed at [facility], [address] [place], [place] [address]         Radiology Studies: No results found.      Scheduled Meds: . amLODipine  10 mg Oral Daily  . aspirin EC  81 mg Oral Daily  . atorvastatin  40 mg Oral q1800  . carbidopa-levodopa  1 tablet Oral QHS  . carbidopa-levodopa  1 tablet Oral TID  . carvedilol  25 mg Oral BID WC  . enoxaparin (LOVENOX) injection  40 mg Subcutaneous Q24H  . feeding supplement  237 mL Oral BID BM  . lamoTRIgine  50 mg Oral BID  . lithium carbonate  300 mg  Oral BID  . metFORMIN  1,000 mg Oral BID WC  . multivitamin with minerals  1 tablet Oral Daily  . QUEtiapine  200 mg Oral QHS  . QUEtiapine  50 mg Oral TID  . sertraline  100 mg Oral Daily  . Valbenazine Tosylate  80 mg Oral Daily   Continuous Infusions:    LOS: 1 day    Time spent: 30 minutes  Barb Merino, MD [facility] Pager [phone]

## 2020-11-01 LAB — RESP PANEL BY RT-PCR (FLU A&B, COVID) ARPGX2
Influenza A by PCR: NEGATIVE
Influenza B by PCR: NEGATIVE
SARS Coronavirus 2 by RT PCR: NEGATIVE

## 2020-11-01 NOTE — TOC Transition Note (Signed)
Transition of Care [facility]) - CM/SW Discharge Note   Patient Details  Name: Kendra Nicholson MRN: [id number] Date of Birth: [birth year]  Transition of Care [facility]) CM/SW Contact:  Shelbie Hutching, RN Phone Number: 11/01/2020, 12:39 PM   Clinical Narrative:    Patient is medically cleared for discharge to [facility] today.  [facility] just needs a copy of her vaccine card and approval from Medicare since she has only been inpatient for 2 days.  COVID is negative.  Cherry from the patient's group home will fax the vaccine card once she leaves from [facility] and gets back to the group home.  RNCM will set up transportation once [facility] gives approval.  Patient will be going to room 102 B, bedside RN will call report to [phone].   Final next level of care: Skilled Nursing Facility Barriers to Discharge: Barriers Resolved   Patient Goals and CMS Choice Patient states their goals for this hospitalization and ongoing recovery are:: Short term rehab and then transition to LTC CMS Medicare.gov Compare Post Acute Care list provided to:: Patient Represenative (must comment) Choice offered to / list presented to : Adult Children  Discharge Placement              Patient chooses bed at: [facility] Patient to be transferred to facility by: [facility] Name of family member notified: [facility] Patient and family notified of of transfer: 11/01/20  Discharge Plan and Services     Post Acute Care Choice: [place]          DME Arranged: N/A DME Agency: NA       HH Arranged: NA          Social Determinants of Health (SDOH) Interventions     Readmission Risk Interventions No flowsheet data found.

## 2020-11-01 NOTE — TOC Transition Note (Signed)
Transition of Care [facility]) - CM/SW Discharge Note   Patient Details  Name: Kendra Nicholson MRN: [id number] Date of Birth: October 05, 1950  Transition of Care [facility]) CM/SW Contact:  Shelbie Hutching, RN Phone Number: 11/01/2020, 3:46 PM   Clinical Narrative:    Ebony Hail with the [facility] called and reports that every thing is good to go.  RNCM has arranged EMS transport.  They said it may be a little while before they can get to her.    Final next level of care: Skilled Nursing Facility Barriers to Discharge: Barriers Resolved   Patient Goals and CMS Choice Patient states their goals for this hospitalization and ongoing recovery are:: Short term rehab and then transition to LTC CMS Medicare.gov Compare Post Acute Care list provided to:: Patient Represenative (must comment) Choice offered to / list presented to : Adult Children  Discharge Placement              Patient chooses bed at: [facility] Patient to be transferred to facility by: [facility] Name of family member notified: [facility] Patient and family notified of of transfer: 11/01/20  Discharge Plan and Services     Post Acute Care Choice: [place]          DME Arranged: N/A DME Agency: NA       HH Arranged: NA          Social Determinants of Health (SDOH) Interventions     Readmission Risk Interventions No flowsheet data found.

## 2020-11-01 NOTE — Progress Notes (Signed)
PT Cancellation Note  Patient Details Name: Kendra Nicholson MRN: [id number] DOB: 04/28/1951   Cancelled Treatment:    Reason Eval/Treat Not Completed: Patient declined, no reason specified. Pt is currently eating breakfast and requests to be seen at later time.  Pt will be seen as medically appropriate at later time today.    Gwenlyn Saran, PT, DPT 11/01/20, 9:58 AM

## 2020-11-01 NOTE — Progress Notes (Signed)
PT Cancellation Note  Patient Details Name: Kendra Nicholson MRN: [id number] DOB: 06/14/1951   Cancelled Treatment:    Reason Eval/Treat Not Completed: Other (comment).  EMS is currently on way to transfer pt to SNF.  If pt is required to stay overnight, she will be seen at later date/time as medically appropriate.    Gwenlyn Saran, PT, DPT 11/01/20, 4:36 PM

## 2020-11-01 NOTE — TOC Progression Note (Signed)
Transition of Care [facility]) - Progression Note    Patient Details  Name: Kendra Nicholson MRN: [id number] Date of Birth: 23-Mar-1951  Transition of Care [facility]) CM/SW Contact  Shelbie Hutching, RN Phone Number: 11/01/2020, 4:53 PM  Clinical Narrative:     EMS called this RNCM back to notify that they are unable to complete transport tonight.  RNCM checked with [facility] and [facility] and no one has availability.  TOC will need to arrange transport in the morning.    Expected Discharge Plan: Skilled Nursing Facility Barriers to Discharge: Barriers Resolved  Expected Discharge Plan and Services Expected Discharge Plan: [place] Choice: [place]   Expected Discharge Date: 11/01/20               DME Arranged: N/A DME Agency: NA       HH Arranged: NA           Social Determinants of Health (SDOH) Interventions    Readmission Risk Interventions No flowsheet data found.

## 2020-11-01 NOTE — Discharge Summary (Addendum)
Physician Discharge Summary  Kendra Nicholson IOE:[id number] DOB: [birth year] DOA: 10/29/2020  PCP: Remi Haggard, FNP  Admit date: 10/29/2020 Discharge date: 11/01/2020  Admitted From: Assisted living facility Disposition: Skilled nursing facility  Recommendations for Outpatient Follow-up:  1. Follow up with PCP in 1-2 weeks after discharge 2. Please obtain BMP/CBC/magnesium/phosphorus in one week   Home Health: Not applicable Equipment/Devices: Not applicable  Discharge Condition: Stable CODE STATUS: Full code Diet recommendation: Low-salt and low-carb diet.  Dysphagia 3 diet with aspiration precautions.  Discharge summary: Patient is 70 year old female with history of Parkinson's with right hemibody parkinsonism, hypertension, hyperlipidemia, depression, anxiety, non-insulin-dependent diabetes who was brought to the ER with frequent falls and acting different.  Patient lives in assisted living facility.  She was found to be more weaker and confused than usual so they sent her to ER.  At the emergency room patient was hemodynamically stable.  Urinalysis was abnormal.  Started on Rocephin and admitted to the hospital.   Assessment & Plan of care:   Acute metabolic encephalopathy in a patient with underlying Parkinson's and dementia, advanced debility: Probably multifactorial.  Patient is with advanced Parkinson's disease and dementia.  CT head was normal.  Currently without any new neurological deficit.  Mental status at baseline.  Acute UTI present on admission: Suspected.  Treated with Rocephin.  Unfortunately, urine cultures were not collected on admission.  Since patient is clinically improved, treated with 3 days of IV antibiotics.  Bipolar disorder and depression: On multiple  Medications.  Symptoms are well controlled. Patient on lithium 300 mg twice daily, lithium level was checked and reviewed and she is therapeutic.  Continue.  Patient is on Seroquel 50 mg 3 times a day  and 200 mg at night that is continued.  Patient is also on sertraline 100 mg daily that is continued.  Her mental status is already improving.    Frequent falls/ deconditioning: Patient will benefit with inpatient therapies.  Referred to SNF.  Type 2 diabetes: Well controlled.  On Metformin that is continued.  Parkinson's disease: Patient on carbidopa/levodopa.  Verified by pharmacy.  Resume home medications.  Patient is medically stabilized to discharge to skilled level of care today.  Ultimate goal is to go back to assisted living facility after rehab.  11/02/2020; addendum. Patient seen and examined. Discharge summary reviewed. No changes needed . Stable for transfer    Discharge Diagnoses:  Principal Problem:   UTI (urinary tract infection) Active Problems:   PTSD (post-traumatic stress disorder)   Diabetes ([place])   Bipolar 1 disorder, mixed, partial remission (HCC)   Bipolar affective disorder, depressed, moderate (HCC)   Anxiety   Atypical parkinsonism (HCC)   Benign essential hypertension   Bipolar disorder, current episode manic severe with psychotic features ([place])   Diabetes mellitus type 2, uncomplicated ([place])   Encephalopathy   Obstructive sleep apnea syndrome   Failure to thrive in adult   Poor appetite   Malnutrition of moderate degree    Discharge Instructions  Discharge Instructions    Diet - low sodium heart healthy   Complete by: As directed    Dysphagia 3 diet, mechanical soft, chopped meat.  Aspiration precautions.   Increase activity slowly   Complete by: As directed      Allergies as of 11/01/2020      Reactions   Bee Venom Anaphylaxis   Demerol [meperidine] Anaphylaxis   Ivp Dye [iodinated Diagnostic Agents] Anaphylaxis   Shellfish Allergy Anaphylaxis   Sulfa Antibiotics  Adhesive [tape] Rash   Iodine Rash   Latex Rash      Medication List    TAKE these medications   acetaminophen 325 MG tablet Commonly known as: TYLENOL Take 2  tablets (650 mg total) by mouth every 6 (six) hours as needed for mild pain. What changed: when to take this   amLODipine 10 MG tablet Commonly known as: NORVASC Take 10 mg by mouth daily.   aspirin EC 81 MG tablet Take 81 mg by mouth daily.   atorvastatin 40 MG tablet Commonly known as: LIPITOR Take 40 mg by mouth daily.   carbidopa-levodopa 25-100 MG tablet Commonly known as: Sinemet Take 1 tablet by mouth 3 (three) times daily.   carbidopa-levodopa 50-200 MG tablet Commonly known as: SINEMET CR Take 1 tablet by mouth at bedtime.   carvedilol 25 MG tablet Commonly known as: COREG Take 25 mg by mouth 2 (two) times daily with a meal.   EPINEPHrine 0.3 mg/0.3 mL Soaj injection Commonly known as: EpiPen 2-Pak Inject 0.3 mLs (0.3 mg total) into the muscle once as needed (for severe allergic reaction).   Ingrezza 80 MG Caps Generic drug: Valbenazine Tosylate Take 80 mg by mouth daily.   lamoTRIgine 25 MG tablet Commonly known as: LAMICTAL Take 50 mg by mouth 2 (two) times daily as needed.   lithium 300 MG tablet Take 300 mg by mouth 2 (two) times daily.   loratadine 10 MG tablet Commonly known as: CLARITIN Take 10 mg by mouth daily as needed for allergies.   magnesium oxide 400 MG tablet Commonly known as: MAG-OX Take 2 tablets by mouth 2 (two) times daily.   metFORMIN 1000 MG tablet Commonly known as: GLUCOPHAGE Take 1 tablet (1,000 mg total) by mouth 2 (two) times daily.   polyethylene glycol 17 g packet Commonly known as: MIRALAX / GLYCOLAX Take 17 g by mouth daily.   QUEtiapine 50 MG tablet Commonly known as: SEROQUEL Take 50 mg by mouth 3 (three) times daily.   QUEtiapine 200 MG 24 hr tablet Commonly known as: SEROQUEL XR Take 200 mg by mouth at bedtime.   sertraline 100 MG tablet Commonly known as: ZOLOFT Take 100 mg by mouth daily.       Contact information for after-discharge care    [facility] .    Service: Skilled Nursing Contact information: [address] [place] [phone]                 Allergies  Allergen Reactions  . Bee Venom Anaphylaxis  . Demerol [Meperidine] Anaphylaxis  . Ivp Dye [Iodinated Diagnostic Agents] Anaphylaxis  . Shellfish Allergy Anaphylaxis  . Sulfa Antibiotics   . Adhesive [Tape] Rash  . Iodine Rash  . Latex Rash    Consultations:  None   Procedures/Studies: CT Head Wo Contrast  Result Date: 10/29/2020 CLINICAL DATA:  Frequent falls EXAM: CT HEAD WITHOUT CONTRAST TECHNIQUE: Contiguous axial images were obtained from the base of the skull through the vertex without intravenous contrast. COMPARISON:  10/01/2017 FINDINGS: Brain: There is atrophy and chronic small vessel disease changes. No acute intracranial abnormality. Specifically, no hemorrhage, hydrocephalus, mass lesion, acute infarction, or significant intracranial injury. Vascular: No hyperdense vessel or unexpected calcification. Skull: No acute calvarial abnormality. Sinuses/Orbits: Visualized paranasal sinuses and mastoids clear. Orbital soft tissues unremarkable. Other: None IMPRESSION: Atrophy, chronic microvascular disease. No acute intracranial abnormality. Electronically Signed   By: Rolm Baptise M.D.   On:  10/29/2020 11:27   CT Cervical Spine Wo Contrast  Result Date: 10/29/2020 CLINICAL DATA:  Frequent falls EXAM: CT CERVICAL SPINE WITHOUT CONTRAST TECHNIQUE: Multidetector CT imaging of the cervical spine was performed without intravenous contrast. Multiplanar CT image reconstructions were also generated. COMPARISON:  None. FINDINGS: Alignment: No subluxation. Skull base and vertebrae: No acute fracture. No primary bone lesion or focal pathologic process. Soft tissues and spinal canal: No prevertebral fluid or swelling. No visible canal hematoma. Disc levels: Diffuse degenerative facet disease bilaterally, left greater than right. Early anterior spurring.  Disc spaces maintained. Upper chest: No acute findings Other: None IMPRESSION: Degenerative facet disease bilaterally, left greater than right. No acute bony abnormality. Electronically Signed   By: Rolm Baptise M.D.   On: 10/29/2020 11:28   DG Chest Portable 1 View  Result Date: 10/29/2020 CLINICAL DATA:  Weakness, bilateral hand pain.  Fall. EXAM: PORTABLE CHEST 1 VIEW COMPARISON:  10/01/2017 FINDINGS: Heart and mediastinal contours are within normal limits. No focal opacities or effusions. No acute bony abnormality. Aortic atherosclerosis. IMPRESSION: No active cardiopulmonary disease. Electronically Signed   By: Rolm Baptise M.D.   On: 10/29/2020 11:28    (Echo, Carotid, EGD, Colonoscopy, ERCP)    Subjective: Patient seen and examined.  Pleasant.  No overnight events.  Afebrile.  She herself denies any complaints.   Discharge Exam: Vitals:   11/01/20 0529 11/01/20 0810  BP: (!) 121/59 (!) 112/47  Pulse: (!) 58 (!) 53  Resp: 20 16  Temp: 98.7 F (37.1 C) 97.6 F (36.4 C)  SpO2: 99% 98%   Vitals:   10/31/20 1935 10/31/20 2354 11/01/20 0529 11/01/20 0810  BP: 105/67 (!) 106/52 (!) 121/59 (!) 112/47  Pulse: 65 60 (!) 58 (!) 53  Resp: 16 18 20 16   Temp: 98.9 F (37.2 C) (!) 97.5 F (36.4 C) 98.7 F (37.1 C) 97.6 F (36.4 C)  TempSrc: Oral Oral Oral Oral  SpO2: 98% 95% 99% 98%  Weight:      Height:        General: Pt is alert, awake, not in acute distress Patient is frail and debilitated.  Not in any distress. She has cognitive deficits, she is alert and awake and oriented to self.  No agitations.  Without any hallucinations or delusions. Generalized weakness present, no focal deficits. Cardiovascular: RRR, S1/S2 +, no rubs, no gallops Respiratory: CTA bilaterally, no wheezing, no rhonchi Abdominal: Soft, NT, ND, bowel sounds + Extremities: no edema, no cyanosis    The results of significant diagnostics from this hospitalization (including imaging, microbiology,  ancillary and laboratory) are listed below for reference.     Microbiology: Recent Results (from the past 240 hour(s))  SARS CORONAVIRUS 2 (TAT 6-24 HRS) Nasopharyngeal Nasopharyngeal Swab     Status: None   Collection Time: 10/29/20  3:45 PM   Specimen: Nasopharyngeal Swab  Result Value Ref Range Status   SARS Coronavirus 2 NEGATIVE NEGATIVE Final    Comment: (NOTE) SARS-CoV-2 target nucleic acids are NOT DETECTED.  The SARS-CoV-2 RNA is generally detectable in upper and lower respiratory specimens during the acute phase of infection. Negative results do not preclude SARS-CoV-2 infection, do not rule out co-infections with other pathogens, and should not be used as the sole basis for treatment or other patient management decisions. Negative results must be combined with clinical observations, patient history, and epidemiological information. The expected result is Negative.  Fact Sheet for Patients: SugarRoll.be  Fact Sheet for Healthcare Providers: https://www.woods-mathews.com/  This test  is not yet approved or cleared by the [place] and  has been authorized for detection and/or diagnosis of SARS-CoV-2 by FDA under an Emergency Use Authorization (EUA). This EUA will remain  in effect (meaning this test can be used) for the duration of the COVID-19 declaration under Se ction 564(b)(1) of the Act, 21 U.S.C. section 360bbb-3(b)(1), unless the authorization is terminated or revoked sooner.  Performed at [facility], [place] [address] [place], [place] [address]   C Difficile Quick Screen w PCR reflex     Status: None   Collection Time: 10/30/20  2:58 AM   Specimen: STOOL  Result Value Ref Range Status   C Diff antigen NEGATIVE NEGATIVE Final   C Diff toxin NEGATIVE NEGATIVE Final   C Diff interpretation No C. difficile detected.  Final    Comment: Performed at [facility], [address] [place],  [place] [address]  Gastrointestinal Panel by PCR , Stool     Status: None   Collection Time: 10/30/20  2:58 AM   Specimen: Stool  Result Value Ref Range Status   Campylobacter species NOT DETECTED NOT DETECTED Final   Plesimonas shigelloides NOT DETECTED NOT DETECTED Final   Salmonella species NOT DETECTED NOT DETECTED Final   Yersinia enterocolitica NOT DETECTED NOT DETECTED Final   Vibrio species NOT DETECTED NOT DETECTED Final   Vibrio cholerae NOT DETECTED NOT DETECTED Final   Enteroaggregative E coli (EAEC) NOT DETECTED NOT DETECTED Final   Enteropathogenic E coli (EPEC) NOT DETECTED NOT DETECTED Final   Enterotoxigenic E coli (ETEC) NOT DETECTED NOT DETECTED Final   Shiga like toxin producing E coli (STEC) NOT DETECTED NOT DETECTED Final   Shigella/Enteroinvasive E coli (EIEC) NOT DETECTED NOT DETECTED Final   Cryptosporidium NOT DETECTED NOT DETECTED Final   Cyclospora cayetanensis NOT DETECTED NOT DETECTED Final   Entamoeba histolytica NOT DETECTED NOT DETECTED Final   Giardia lamblia NOT DETECTED NOT DETECTED Final   Adenovirus F40/41 NOT DETECTED NOT DETECTED Final   Astrovirus NOT DETECTED NOT DETECTED Final   Norovirus GI/GII NOT DETECTED NOT DETECTED Final   Rotavirus A NOT DETECTED NOT DETECTED Final   Sapovirus (I, II, IV, and V) NOT DETECTED NOT DETECTED Final    Comment: Performed at [facility], [address] [place], [place] [address]     Labs: BNP (last 3 results) No results for input(s): BNP in the last 8760 hours. Basic Metabolic Panel: Recent Labs  Lab 10/29/20 1122 10/30/20 0645  NA 137 140  K 4.1 3.9  CL 106 110  CO2 21* 24  GLUCOSE 119* 105*  BUN 8 11  CREATININE 0.55 0.63  CALCIUM 9.5 9.1   Liver Function Tests: Recent Labs  Lab 10/29/20 1122  AST 15  ALT <5  ALKPHOS 43  BILITOT 0.7  PROT 7.0  ALBUMIN 4.2   No results for input(s): LIPASE, AMYLASE in the last 168 hours. No results for input(s): AMMONIA in the last 168  hours. CBC: Recent Labs  Lab 10/29/20 1122 10/30/20 0645  WBC 7.5 6.0  NEUTROABS 5.5  --   HGB 12.6 11.1*  HCT 37.5 33.5*  MCV 92.8 92.8  PLT 162 248   Cardiac Enzymes: No results for input(s): CKTOTAL, CKMB, CKMBINDEX, TROPONINI in the last 168 hours. BNP: Invalid input(s): POCBNP CBG: No results for input(s): GLUCAP in the last 168 hours. D-Dimer No results for input(s): DDIMER in the last 72 hours. Hgb A1c No results for  input(s): HGBA1C in the last 72 hours. Lipid Profile No results for input(s): CHOL, HDL, LDLCALC, TRIG, CHOLHDL, LDLDIRECT in the last 72 hours. Thyroid function studies Recent Labs    10/29/20 1122  TSH 2.762   Anemia work up No results for input(s): VITAMINB12, FOLATE, FERRITIN, TIBC, IRON, RETICCTPCT in the last 72 hours. Urinalysis    Component Value Date/Time   COLORURINE AMBER (A) 10/29/2020 1212   APPEARANCEUR CLOUDY (A) 10/29/2020 1212   LABSPEC 1.015 10/29/2020 1212   PHURINE 7.0 10/29/2020 1212   GLUCOSEU 150 (A) 10/29/2020 1212   HGBUR SMALL (A) 10/29/2020 1212   BILIRUBINUR NEGATIVE 10/29/2020 1212   KETONESUR 5 (A) 10/29/2020 1212   PROTEINUR 30 (A) 10/29/2020 1212   NITRITE NEGATIVE 10/29/2020 1212   LEUKOCYTESUR TRACE (A) 10/29/2020 1212   Sepsis Labs Invalid input(s): PROCALCITONIN,  WBC,  LACTICIDVEN Microbiology Recent Results (from the past 240 hour(s))  SARS CORONAVIRUS 2 (TAT 6-24 HRS) Nasopharyngeal Nasopharyngeal Swab     Status: None   Collection Time: 10/29/20  3:45 PM   Specimen: Nasopharyngeal Swab  Result Value Ref Range Status   SARS Coronavirus 2 NEGATIVE NEGATIVE Final    Comment: (NOTE) SARS-CoV-2 target nucleic acids are NOT DETECTED.  The SARS-CoV-2 RNA is generally detectable in upper and lower respiratory specimens during the acute phase of infection. Negative results do not preclude SARS-CoV-2 infection, do not rule out co-infections with other pathogens, and should not be used as the sole basis  for treatment or other patient management decisions. Negative results must be combined with clinical observations, patient history, and epidemiological information. The expected result is Negative.  Fact Sheet for Patients: SugarRoll.be  Fact Sheet for Healthcare Providers: https://www.woods-mathews.com/  This test is not yet approved or cleared by the [place] FDA and  has been authorized for detection and/or diagnosis of SARS-CoV-2 by FDA under an Emergency Use Authorization (EUA). This EUA will remain  in effect (meaning this test can be used) for the duration of the COVID-19 declaration under Se ction 564(b)(1) of the Act, 21 U.S.C. section 360bbb-3(b)(1), unless the authorization is terminated or revoked sooner.  Performed at [facility], [place] [address], [place], [place] [address]   C Difficile Quick Screen w PCR reflex     Status: None   Collection Time: 10/30/20  2:58 AM   Specimen: STOOL  Result Value Ref Range Status   C Diff antigen NEGATIVE NEGATIVE Final   C Diff toxin NEGATIVE NEGATIVE Final   C Diff interpretation No C. difficile detected.  Final    Comment: Performed at [facility], [place] [address] [place], [place] [address]  Gastrointestinal Panel by PCR , Stool     Status: None   Collection Time: 10/30/20  2:58 AM   Specimen: Stool  Result Value Ref Range Status   Campylobacter species NOT DETECTED NOT DETECTED Final   Plesimonas shigelloides NOT DETECTED NOT DETECTED Final   Salmonella species NOT DETECTED NOT DETECTED Final   Yersinia enterocolitica NOT DETECTED NOT DETECTED Final   Vibrio species NOT DETECTED NOT DETECTED Final   Vibrio cholerae NOT DETECTED NOT DETECTED Final   Enteroaggregative E coli (EAEC) NOT DETECTED NOT DETECTED Final   Enteropathogenic E coli (EPEC) NOT DETECTED NOT DETECTED Final   Enterotoxigenic E coli (ETEC) NOT DETECTED NOT DETECTED Final   Shiga like toxin  producing E coli (STEC) NOT DETECTED NOT DETECTED Final   Shigella/Enteroinvasive E coli (EIEC) NOT DETECTED NOT DETECTED Final  Cryptosporidium NOT DETECTED NOT DETECTED Final   Cyclospora cayetanensis NOT DETECTED NOT DETECTED Final   Entamoeba histolytica NOT DETECTED NOT DETECTED Final   Giardia lamblia NOT DETECTED NOT DETECTED Final   Adenovirus F40/41 NOT DETECTED NOT DETECTED Final   Astrovirus NOT DETECTED NOT DETECTED Final   Norovirus GI/GII NOT DETECTED NOT DETECTED Final   Rotavirus A NOT DETECTED NOT DETECTED Final   Sapovirus (I, II, IV, and V) NOT DETECTED NOT DETECTED Final    Comment: Performed at [facility], [address], [place], [place] [address]     Time coordinating discharge:  32 minutes  SIGNED:   Barb Merino, MD  [facility] 11/01/2020, 11:00 AM

## 2020-11-02 NOTE — Plan of Care (Signed)
Pt is alert and oriented to person place and situation. Awaiting transport for in the am to the [facility] center. Pt COVID and flu results negative. Cooperative and calm behavior and denies pain. Will continue to monitor.  Problem: Education: Goal: Knowledge of General Education information will improve Description: Including pain rating scale, medication(s)/side effects and non-pharmacologic comfort measures Outcome: Progressing   Problem: Activity: Goal: Risk for activity intolerance will decrease Outcome: Progressing   Problem: Coping: Goal: Level of anxiety will decrease Outcome: Progressing   Problem: Safety: Goal: Ability to remain free from injury will improve Outcome: Progressing   Problem: Skin Integrity: Goal: Risk for impaired skin integrity will decrease Outcome: Progressing   Problem: Urinary Elimination: Goal: Signs and symptoms of infection will decrease Outcome: Adequate for Discharge

## 2020-11-02 NOTE — Progress Notes (Signed)
Patient seen and examined. Sitting at the couch. She looks fresh, hair done and comfortable. She knows she is going to [facility].   Discharge summary reviewed , addended. No changes needed. Stable for discharge.   Today's Vitals   11/02/20 0139 11/02/20 0558 11/02/20 0727 11/02/20 0810  BP: (!) 98/48 (!) 113/49 96/61   Pulse: 61 60 66   Resp: 15 15 16    Temp: 98.2 F (36.8 C) (!) 97.3 F (36.3 C) 97.7 F (36.5 C)   TempSrc:   Oral   SpO2: 98% 98% 100%   Weight:      Height:      PainSc:    0-No pain   Body mass index is 24.2 kg/m.   Physical Exam Constitutional:      Appearance: Normal appearance.  HENT:     Head: Atraumatic.  Cardiovascular:     Rate and Rhythm: Regular rhythm.  Pulmonary:     Breath sounds: Normal breath sounds.  Musculoskeletal:     Cervical back: Neck supple.  Neurological:     General: No focal deficit present.     Mental Status: She is alert.     Comments: Pleasant, oriented to place , person and situation.      Total time spent : 15 minuets

## 2020-11-02 NOTE — Discharge Summary (Signed)
Physician Discharge Summary  Kendra Nicholson EXH:[id number] DOB: July 18, 1951 DOA: 10/29/2020  PCP: Remi Haggard, FNP  Admit date: 10/29/2020 Discharge date: 11/02/2020  Admitted From: Assisted living facility Disposition: Skilled nursing facility  Recommendations for Outpatient Follow-up:  1. Follow up with PCP in 1-2 weeks after discharge 2. Please obtain BMP/CBC/magnesium/phosphorus in one week   Home Health: Not applicable Equipment/Devices: Not applicable  Discharge Condition: Stable CODE STATUS: Full code Diet recommendation: Low-salt and low-carb diet.  Dysphagia 3 diet with aspiration precautions.  Discharge summary: Patient is 70 year old female with history of Parkinson's with right hemibody parkinsonism, hypertension, hyperlipidemia, depression, anxiety, non-insulin-dependent diabetes who was brought to the ER with frequent falls and acting different.  Patient lives in assisted living facility.  She was found to be more weaker and confused than usual so they sent her to ER.  At the emergency room patient was hemodynamically stable.  Urinalysis was abnormal.  Started on Rocephin and admitted to the hospital.   Assessment & Plan of care:   Acute metabolic encephalopathy in a patient with underlying Parkinson's and dementia, advanced debility: Probably multifactorial.  Patient is with advanced Parkinson's disease and dementia.  CT head was normal.  Currently without any new neurological deficit.  Mental status at baseline.  Acute UTI present on admission: Suspected.  Treated with Rocephin.  Unfortunately, urine cultures were not collected on admission.  Since patient is clinically improved, treated with 3 days of IV antibiotics.  Bipolar disorder and depression: On multiple  Medications.  Symptoms are well controlled. Patient on lithium 300 mg twice daily, lithium level was checked and reviewed and she is therapeutic.  Continue.  Patient is on Seroquel 50 mg 3 times a day  and 200 mg at night that is continued.  Patient is also on sertraline 100 mg daily that is continued.  Her mental status is already improving.    Frequent falls/ deconditioning: Patient will benefit with inpatient therapies.  Referred to SNF.  Type 2 diabetes: Well controlled.  On Metformin that is continued.  Parkinson's disease: Patient on carbidopa/levodopa.  Verified by pharmacy.  Resume home medications.  Patient is medically stabilized to discharge to skilled level of care today.  Ultimate goal is to go back to assisted living facility after rehab.  11/02/2020; addendum. Patient seen and examined. Discharge summary reviewed. No changes needed . Stable for transfer    Discharge Diagnoses:  Principal Problem:   UTI (urinary tract infection) Active Problems:   PTSD (post-traumatic stress disorder)   Diabetes ([place])   Bipolar 1 disorder, mixed, partial remission (HCC)   Bipolar affective disorder, depressed, moderate (HCC)   Anxiety   Atypical parkinsonism (HCC)   Benign essential hypertension   Bipolar disorder, current episode manic severe with psychotic features ([place])   Diabetes mellitus type 2, uncomplicated ([place])   Encephalopathy   Obstructive sleep apnea syndrome   Failure to thrive in adult   Poor appetite   Malnutrition of moderate degree    Discharge Instructions  Discharge Instructions    Diet - low sodium heart healthy   Complete by: As directed    Dysphagia 3 diet, mechanical soft, chopped meat.  Aspiration precautions.   Increase activity slowly   Complete by: As directed      Allergies as of 11/02/2020      Reactions   Bee Venom Anaphylaxis   Demerol [meperidine] Anaphylaxis   Ivp Dye [iodinated Diagnostic Agents] Anaphylaxis   Shellfish Allergy Anaphylaxis   Sulfa Antibiotics  Adhesive [tape] Rash   Iodine Rash   Latex Rash      Medication List    TAKE these medications   acetaminophen 325 MG tablet Commonly known as: TYLENOL Take 2  tablets (650 mg total) by mouth every 6 (six) hours as needed for mild pain. What changed: when to take this   amLODipine 10 MG tablet Commonly known as: NORVASC Take 10 mg by mouth daily.   aspirin EC 81 MG tablet Take 81 mg by mouth daily.   atorvastatin 40 MG tablet Commonly known as: LIPITOR Take 40 mg by mouth daily.   carbidopa-levodopa 25-100 MG tablet Commonly known as: Sinemet Take 1 tablet by mouth 3 (three) times daily.   carbidopa-levodopa 50-200 MG tablet Commonly known as: SINEMET CR Take 1 tablet by mouth at bedtime.   carvedilol 25 MG tablet Commonly known as: COREG Take 25 mg by mouth 2 (two) times daily with a meal.   EPINEPHrine 0.3 mg/0.3 mL Soaj injection Commonly known as: EpiPen 2-Pak Inject 0.3 mLs (0.3 mg total) into the muscle once as needed (for severe allergic reaction).   Ingrezza 80 MG Caps Generic drug: Valbenazine Tosylate Take 80 mg by mouth daily.   lamoTRIgine 25 MG tablet Commonly known as: LAMICTAL Take 50 mg by mouth 2 (two) times daily as needed.   lithium 300 MG tablet Take 300 mg by mouth 2 (two) times daily.   loratadine 10 MG tablet Commonly known as: CLARITIN Take 10 mg by mouth daily as needed for allergies.   magnesium oxide 400 MG tablet Commonly known as: MAG-OX Take 2 tablets by mouth 2 (two) times daily.   metFORMIN 1000 MG tablet Commonly known as: GLUCOPHAGE Take 1 tablet (1,000 mg total) by mouth 2 (two) times daily.   polyethylene glycol 17 g packet Commonly known as: MIRALAX / GLYCOLAX Take 17 g by mouth daily.   QUEtiapine 50 MG tablet Commonly known as: SEROQUEL Take 50 mg by mouth 3 (three) times daily.   QUEtiapine 200 MG 24 hr tablet Commonly known as: SEROQUEL XR Take 200 mg by mouth at bedtime.   sertraline 100 MG tablet Commonly known as: ZOLOFT Take 100 mg by mouth daily.       Contact information for after-discharge care    [facility] .    Service: Skilled Nursing Contact information: [address] [place] [phone]                 Allergies  Allergen Reactions  . Bee Venom Anaphylaxis  . Demerol [Meperidine] Anaphylaxis  . Ivp Dye [Iodinated Diagnostic Agents] Anaphylaxis  . Shellfish Allergy Anaphylaxis  . Sulfa Antibiotics   . Adhesive [Tape] Rash  . Iodine Rash  . Latex Rash    Consultations:  None   Procedures/Studies: CT Head Wo Contrast  Result Date: 10/29/2020 CLINICAL DATA:  Frequent falls EXAM: CT HEAD WITHOUT CONTRAST TECHNIQUE: Contiguous axial images were obtained from the base of the skull through the vertex without intravenous contrast. COMPARISON:  10/01/2017 FINDINGS: Brain: There is atrophy and chronic small vessel disease changes. No acute intracranial abnormality. Specifically, no hemorrhage, hydrocephalus, mass lesion, acute infarction, or significant intracranial injury. Vascular: No hyperdense vessel or unexpected calcification. Skull: No acute calvarial abnormality. Sinuses/Orbits: Visualized paranasal sinuses and mastoids clear. Orbital soft tissues unremarkable. Other: None IMPRESSION: Atrophy, chronic microvascular disease. No acute intracranial abnormality. Electronically Signed   By: Rolm Baptise M.D.   On:  10/29/2020 11:27   CT Cervical Spine Wo Contrast  Result Date: 10/29/2020 CLINICAL DATA:  Frequent falls EXAM: CT CERVICAL SPINE WITHOUT CONTRAST TECHNIQUE: Multidetector CT imaging of the cervical spine was performed without intravenous contrast. Multiplanar CT image reconstructions were also generated. COMPARISON:  None. FINDINGS: Alignment: No subluxation. Skull base and vertebrae: No acute fracture. No primary bone lesion or focal pathologic process. Soft tissues and spinal canal: No prevertebral fluid or swelling. No visible canal hematoma. Disc levels: Diffuse degenerative facet disease bilaterally, left greater than right. Early anterior spurring.  Disc spaces maintained. Upper chest: No acute findings Other: None IMPRESSION: Degenerative facet disease bilaterally, left greater than right. No acute bony abnormality. Electronically Signed   By: Rolm Baptise M.D.   On: 10/29/2020 11:28   DG Chest Portable 1 View  Result Date: 10/29/2020 CLINICAL DATA:  Weakness, bilateral hand pain.  Fall. EXAM: PORTABLE CHEST 1 VIEW COMPARISON:  10/01/2017 FINDINGS: Heart and mediastinal contours are within normal limits. No focal opacities or effusions. No acute bony abnormality. Aortic atherosclerosis. IMPRESSION: No active cardiopulmonary disease. Electronically Signed   By: Rolm Baptise M.D.   On: 10/29/2020 11:28   (Echo, Carotid, EGD, Colonoscopy, ERCP)    Subjective: Patient seen and examined.  Pleasant.  No overnight events.  Afebrile.  She herself denies any complaints.   Discharge Exam: Vitals:   11/02/20 0558 11/02/20 0727  BP: (!) 113/49 96/61  Pulse: 60 66  Resp: 15 16  Temp: (!) 97.3 F (36.3 C) 97.7 F (36.5 C)  SpO2: 98% 100%   Vitals:   11/01/20 2152 11/02/20 0139 11/02/20 0558 11/02/20 0727  BP: (!) 103/50 (!) 98/48 (!) 113/49 96/61  Pulse: 65 61 60 66  Resp: 16 15 15 16   Temp: 98 F (36.7 C) 98.2 F (36.8 C) (!) 97.3 F (36.3 C) 97.7 F (36.5 C)  TempSrc:    Oral  SpO2: 99% 98% 98% 100%  Weight:      Height:        General: Pt is alert, awake, not in acute distress Patient is frail and debilitated.  Not in any distress. She has cognitive deficits, she is alert and awake and oriented to self.  No agitations.  Without any hallucinations or delusions. Generalized weakness present, no focal deficits. Cardiovascular: RRR, S1/S2 +, no rubs, no gallops Respiratory: CTA bilaterally, no wheezing, no rhonchi Abdominal: Soft, NT, ND, bowel sounds + Extremities: no edema, no cyanosis    The results of significant diagnostics from this hospitalization (including imaging, microbiology, ancillary and laboratory) are listed  below for reference.     Microbiology: Recent Results (from the past 240 hour(s))  SARS CORONAVIRUS 2 (TAT 6-24 HRS) Nasopharyngeal Nasopharyngeal Swab     Status: None   Collection Time: 10/29/20  3:45 PM   Specimen: Nasopharyngeal Swab  Result Value Ref Range Status   SARS Coronavirus 2 NEGATIVE NEGATIVE Final    Comment: (NOTE) SARS-CoV-2 target nucleic acids are NOT DETECTED.  The SARS-CoV-2 RNA is generally detectable in upper and lower respiratory specimens during the acute phase of infection. Negative results do not preclude SARS-CoV-2 infection, do not rule out co-infections with other pathogens, and should not be used as the sole basis for treatment or other patient management decisions. Negative results must be combined with clinical observations, patient history, and epidemiological information. The expected result is Negative.  Fact Sheet for Patients: SugarRoll.be  Fact Sheet for Healthcare Providers: https://www.woods-mathews.com/  This test is not yet approved or  cleared by the [place] and  has been authorized for detection and/or diagnosis of SARS-CoV-2 by FDA under an Emergency Use Authorization (EUA). This EUA will remain  in effect (meaning this test can be used) for the duration of the COVID-19 declaration under Se ction 564(b)(1) of the Act, 21 U.S.C. section 360bbb-3(b)(1), unless the authorization is terminated or revoked sooner.  Performed at [facility], [place] [address], [place], [place] [address]   C Difficile Quick Screen w PCR reflex     Status: None   Collection Time: 10/30/20  2:58 AM   Specimen: STOOL  Result Value Ref Range Status   C Diff antigen NEGATIVE NEGATIVE Final   C Diff toxin NEGATIVE NEGATIVE Final   C Diff interpretation No C. difficile detected.  Final    Comment: Performed at [facility], [address] [place], [place] [address]  Gastrointestinal Panel by  PCR , Stool     Status: None   Collection Time: 10/30/20  2:58 AM   Specimen: Stool  Result Value Ref Range Status   Campylobacter species NOT DETECTED NOT DETECTED Final   Plesimonas shigelloides NOT DETECTED NOT DETECTED Final   Salmonella species NOT DETECTED NOT DETECTED Final   Yersinia enterocolitica NOT DETECTED NOT DETECTED Final   Vibrio species NOT DETECTED NOT DETECTED Final   Vibrio cholerae NOT DETECTED NOT DETECTED Final   Enteroaggregative E coli (EAEC) NOT DETECTED NOT DETECTED Final   Enteropathogenic E coli (EPEC) NOT DETECTED NOT DETECTED Final   Enterotoxigenic E coli (ETEC) NOT DETECTED NOT DETECTED Final   Shiga like toxin producing E coli (STEC) NOT DETECTED NOT DETECTED Final   Shigella/Enteroinvasive E coli (EIEC) NOT DETECTED NOT DETECTED Final   Cryptosporidium NOT DETECTED NOT DETECTED Final   Cyclospora cayetanensis NOT DETECTED NOT DETECTED Final   Entamoeba histolytica NOT DETECTED NOT DETECTED Final   Giardia lamblia NOT DETECTED NOT DETECTED Final   Adenovirus F40/41 NOT DETECTED NOT DETECTED Final   Astrovirus NOT DETECTED NOT DETECTED Final   Norovirus GI/GII NOT DETECTED NOT DETECTED Final   Rotavirus A NOT DETECTED NOT DETECTED Final   Sapovirus (I, II, IV, and V) NOT DETECTED NOT DETECTED Final    Comment: Performed at [facility], [address] [place], [place] [address]  Resp Panel by RT-PCR (Flu A&B, Covid) Nasopharyngeal Swab     Status: None   Collection Time: 11/01/20 11:28 AM   Specimen: Nasopharyngeal Swab; Nasopharyngeal(NP) swabs in vial transport medium  Result Value Ref Range Status   SARS Coronavirus 2 by RT PCR NEGATIVE NEGATIVE Final    Comment: (NOTE) SARS-CoV-2 target nucleic acids are NOT DETECTED.  The SARS-CoV-2 RNA is generally detectable in upper respiratory specimens during the acute phase of infection. The lowest concentration of SARS-CoV-2 viral copies this assay can detect is 138 copies/mL. A negative  result does not preclude SARS-Cov-2 infection and should not be used as the sole basis for treatment or other patient management decisions. A negative result may occur with  improper specimen collection/handling, submission of specimen other than nasopharyngeal swab, presence of viral mutation(s) within the areas targeted by this assay, and inadequate number of viral copies(<138 copies/mL). A negative result must be combined with clinical observations, patient history, and epidemiological information. The expected result is Negative.  Fact Sheet for Patients:  EntrepreneurPulse.com.au  Fact Sheet for Healthcare Providers:  IncredibleEmployment.be  This test is no t yet approved or cleared by the [place] FDA  and  has been authorized for detection and/or diagnosis of SARS-CoV-2 by FDA under an Emergency Use Authorization (EUA). This EUA will remain  in effect (meaning this test can be used) for the duration of the COVID-19 declaration under Section 564(b)(1) of the Act, 21 U.S.C.section 360bbb-3(b)(1), unless the authorization is terminated  or revoked sooner.       Influenza A by PCR NEGATIVE NEGATIVE Final   Influenza B by PCR NEGATIVE NEGATIVE Final    Comment: (NOTE) The Xpert Xpress SARS-CoV-2/FLU/RSV plus assay is intended as an aid in the diagnosis of influenza from Nasopharyngeal swab specimens and should not be used as a sole basis for treatment. Nasal washings and aspirates are unacceptable for Xpert Xpress SARS-CoV-2/FLU/RSV testing.  Fact Sheet for Patients: EntrepreneurPulse.com.au  Fact Sheet for Healthcare Providers: IncredibleEmployment.be  This test is not yet approved or cleared by the [place] FDA and has been authorized for detection and/or diagnosis of SARS-CoV-2 by FDA under an Emergency Use Authorization (EUA). This EUA will remain in effect (meaning this test can be used)  for the duration of the COVID-19 declaration under Section 564(b)(1) of the Act, 21 U.S.C. section 360bbb-3(b)(1), unless the authorization is terminated or revoked.  Performed at [facility], [address] [place], [place] [address]      Labs: BNP (last 3 results) No results for input(s): BNP in the last 8760 hours. Basic Metabolic Panel: Recent Labs  Lab 10/29/20 1122 10/30/20 0645  NA 137 140  K 4.1 3.9  CL 106 110  CO2 21* 24  GLUCOSE 119* 105*  BUN 8 11  CREATININE 0.55 0.63  CALCIUM 9.5 9.1   Liver Function Tests: Recent Labs  Lab 10/29/20 1122  AST 15  ALT <5  ALKPHOS 43  BILITOT 0.7  PROT 7.0  ALBUMIN 4.2   No results for input(s): LIPASE, AMYLASE in the last 168 hours. No results for input(s): AMMONIA in the last 168 hours. CBC: Recent Labs  Lab 10/29/20 1122 10/30/20 0645  WBC 7.5 6.0  NEUTROABS 5.5  --   HGB 12.6 11.1*  HCT 37.5 33.5*  MCV 92.8 92.8  PLT 162 248   Cardiac Enzymes: No results for input(s): CKTOTAL, CKMB, CKMBINDEX, TROPONINI in the last 168 hours. BNP: Invalid input(s): POCBNP CBG: No results for input(s): GLUCAP in the last 168 hours. D-Dimer No results for input(s): DDIMER in the last 72 hours. Hgb A1c No results for input(s): HGBA1C in the last 72 hours. Lipid Profile No results for input(s): CHOL, HDL, LDLCALC, TRIG, CHOLHDL, LDLDIRECT in the last 72 hours. Thyroid function studies No results for input(s): TSH, T4TOTAL, T3FREE, THYROIDAB in the last 72 hours.  Invalid input(s): FREET3 Anemia work up No results for input(s): VITAMINB12, FOLATE, FERRITIN, TIBC, IRON, RETICCTPCT in the last 72 hours. Urinalysis    Component Value Date/Time   COLORURINE AMBER (A) 10/29/2020 1212   APPEARANCEUR CLOUDY (A) 10/29/2020 1212   LABSPEC 1.015 10/29/2020 1212   PHURINE 7.0 10/29/2020 1212   GLUCOSEU 150 (A) 10/29/2020 1212   HGBUR SMALL (A) 10/29/2020 1212   BILIRUBINUR NEGATIVE 10/29/2020 1212   KETONESUR  5 (A) 10/29/2020 1212   PROTEINUR 30 (A) 10/29/2020 1212   NITRITE NEGATIVE 10/29/2020 1212   LEUKOCYTESUR TRACE (A) 10/29/2020 1212   Sepsis Labs Invalid input(s): PROCALCITONIN,  WBC,  LACTICIDVEN Microbiology Recent Results (from the past 240 hour(s))  SARS CORONAVIRUS 2 (TAT 6-24 HRS) Nasopharyngeal Nasopharyngeal Swab     Status: None   Collection  Time: 10/29/20  3:45 PM   Specimen: Nasopharyngeal Swab  Result Value Ref Range Status   SARS Coronavirus 2 NEGATIVE NEGATIVE Final    Comment: (NOTE) SARS-CoV-2 target nucleic acids are NOT DETECTED.  The SARS-CoV-2 RNA is generally detectable in upper and lower respiratory specimens during the acute phase of infection. Negative results do not preclude SARS-CoV-2 infection, do not rule out co-infections with other pathogens, and should not be used as the sole basis for treatment or other patient management decisions. Negative results must be combined with clinical observations, patient history, and epidemiological information. The expected result is Negative.  Fact Sheet for Patients: SugarRoll.be  Fact Sheet for Healthcare Providers: https://www.woods-mathews.com/  This test is not yet approved or cleared by the [place] FDA and  has been authorized for detection and/or diagnosis of SARS-CoV-2 by FDA under an Emergency Use Authorization (EUA). This EUA will remain  in effect (meaning this test can be used) for the duration of the COVID-19 declaration under Se ction 564(b)(1) of the Act, 21 U.S.C. section 360bbb-3(b)(1), unless the authorization is terminated or revoked sooner.  Performed at [facility], [place] [address], [place], [place] [address]   C Difficile Quick Screen w PCR reflex     Status: None   Collection Time: 10/30/20  2:58 AM   Specimen: STOOL  Result Value Ref Range Status   C Diff antigen NEGATIVE NEGATIVE Final   C Diff toxin NEGATIVE NEGATIVE Final    C Diff interpretation No C. difficile detected.  Final    Comment: Performed at [facility], [address] [place], [place] [address]  Gastrointestinal Panel by PCR , Stool     Status: None   Collection Time: 10/30/20  2:58 AM   Specimen: Stool  Result Value Ref Range Status   Campylobacter species NOT DETECTED NOT DETECTED Final   Plesimonas shigelloides NOT DETECTED NOT DETECTED Final   Salmonella species NOT DETECTED NOT DETECTED Final   Yersinia enterocolitica NOT DETECTED NOT DETECTED Final   Vibrio species NOT DETECTED NOT DETECTED Final   Vibrio cholerae NOT DETECTED NOT DETECTED Final   Enteroaggregative E coli (EAEC) NOT DETECTED NOT DETECTED Final   Enteropathogenic E coli (EPEC) NOT DETECTED NOT DETECTED Final   Enterotoxigenic E coli (ETEC) NOT DETECTED NOT DETECTED Final   Shiga like toxin producing E coli (STEC) NOT DETECTED NOT DETECTED Final   Shigella/Enteroinvasive E coli (EIEC) NOT DETECTED NOT DETECTED Final   Cryptosporidium NOT DETECTED NOT DETECTED Final   Cyclospora cayetanensis NOT DETECTED NOT DETECTED Final   Entamoeba histolytica NOT DETECTED NOT DETECTED Final   Giardia lamblia NOT DETECTED NOT DETECTED Final   Adenovirus F40/41 NOT DETECTED NOT DETECTED Final   Astrovirus NOT DETECTED NOT DETECTED Final   Norovirus GI/GII NOT DETECTED NOT DETECTED Final   Rotavirus A NOT DETECTED NOT DETECTED Final   Sapovirus (I, II, IV, and V) NOT DETECTED NOT DETECTED Final    Comment: Performed at [facility], [address] [place], [place] [address]  Resp Panel by RT-PCR (Flu A&B, Covid) Nasopharyngeal Swab     Status: None   Collection Time: 11/01/20 11:28 AM   Specimen: Nasopharyngeal Swab; Nasopharyngeal(NP) swabs in vial transport medium  Result Value Ref Range Status   SARS Coronavirus 2 by RT PCR NEGATIVE NEGATIVE Final    Comment: (NOTE) SARS-CoV-2 target nucleic acids are NOT DETECTED.  The SARS-CoV-2 RNA is generally detectable  in upper respiratory specimens during the acute  phase of infection. The lowest concentration of SARS-CoV-2 viral copies this assay can detect is 138 copies/mL. A negative result does not preclude SARS-Cov-2 infection and should not be used as the sole basis for treatment or other patient management decisions. A negative result may occur with  improper specimen collection/handling, submission of specimen other than nasopharyngeal swab, presence of viral mutation(s) within the areas targeted by this assay, and inadequate number of viral copies(<138 copies/mL). A negative result must be combined with clinical observations, patient history, and epidemiological information. The expected result is Negative.  Fact Sheet for Patients:  EntrepreneurPulse.com.au  Fact Sheet for Healthcare Providers:  IncredibleEmployment.be  This test is no t yet approved or cleared by the [place] FDA and  has been authorized for detection and/or diagnosis of SARS-CoV-2 by FDA under an Emergency Use Authorization (EUA). This EUA will remain  in effect (meaning this test can be used) for the duration of the COVID-19 declaration under Section 564(b)(1) of the Act, 21 U.S.C.section 360bbb-3(b)(1), unless the authorization is terminated  or revoked sooner.       Influenza A by PCR NEGATIVE NEGATIVE Final   Influenza B by PCR NEGATIVE NEGATIVE Final    Comment: (NOTE) The Xpert Xpress SARS-CoV-2/FLU/RSV plus assay is intended as an aid in the diagnosis of influenza from Nasopharyngeal swab specimens and should not be used as a sole basis for treatment. Nasal washings and aspirates are unacceptable for Xpert Xpress SARS-CoV-2/FLU/RSV testing.  Fact Sheet for Patients: EntrepreneurPulse.com.au  Fact Sheet for Healthcare Providers: IncredibleEmployment.be  This test is not yet approved or cleared by the [place] FDA and has  been authorized for detection and/or diagnosis of SARS-CoV-2 by FDA under an Emergency Use Authorization (EUA). This EUA will remain in effect (meaning this test can be used) for the duration of the COVID-19 declaration under Section 564(b)(1) of the Act, 21 U.S.C. section 360bbb-3(b)(1), unless the authorization is terminated or revoked.  Performed at [facility] [address], [place], [place] [address]      Time coordinating discharge:  32 minutes  SIGNED:   Barb Merino, MD  [facility] 11/02/2020, 10:33 AM

## 2021-01-11 ENCOUNTER — Emergency Department (HOSPITAL_COMMUNITY): Payer: Medicare Other

## 2021-01-11 ENCOUNTER — Encounter (HOSPITAL_COMMUNITY): Payer: Self-pay | Admitting: Emergency Medicine

## 2021-01-11 ENCOUNTER — Emergency Department (HOSPITAL_COMMUNITY)
Admission: EM | Admit: 2021-01-11 | Discharge: 2021-01-11 | Disposition: A | Discharge status: Skilled Nursing Facility | Payer: Medicare Other | Attending: Emergency Medicine | Admitting: Emergency Medicine

## 2021-01-11 ENCOUNTER — Other Ambulatory Visit: Payer: Self-pay

## 2021-01-11 DIAGNOSIS — Z23 Encounter for immunization: Secondary | ICD-10-CM | POA: Diagnosis not present

## 2021-01-11 DIAGNOSIS — Z79899 Other long term (current) drug therapy: Secondary | ICD-10-CM | POA: Insufficient documentation

## 2021-01-11 DIAGNOSIS — W1830XA Fall on same level, unspecified, initial encounter: Secondary | ICD-10-CM | POA: Diagnosis not present

## 2021-01-11 DIAGNOSIS — Y92129 Unspecified place in nursing home as the place of occurrence of the external cause: Secondary | ICD-10-CM | POA: Insufficient documentation

## 2021-01-11 DIAGNOSIS — Z87891 Personal history of nicotine dependence: Secondary | ICD-10-CM | POA: Insufficient documentation

## 2021-01-11 DIAGNOSIS — Z91040 Latex allergy status: Secondary | ICD-10-CM | POA: Insufficient documentation

## 2021-01-11 DIAGNOSIS — E119 Type 2 diabetes mellitus without complications: Secondary | ICD-10-CM | POA: Diagnosis not present

## 2021-01-11 DIAGNOSIS — Z7982 Long term (current) use of aspirin: Secondary | ICD-10-CM | POA: Insufficient documentation

## 2021-01-11 DIAGNOSIS — S0993XA Unspecified injury of face, initial encounter: Secondary | ICD-10-CM | POA: Diagnosis present

## 2021-01-11 DIAGNOSIS — G20 Parkinson's disease: Secondary | ICD-10-CM | POA: Diagnosis not present

## 2021-01-11 DIAGNOSIS — I10 Essential (primary) hypertension: Secondary | ICD-10-CM | POA: Insufficient documentation

## 2021-01-11 DIAGNOSIS — W19XXXA Unspecified fall, initial encounter: Secondary | ICD-10-CM

## 2021-01-11 DIAGNOSIS — Z7984 Long term (current) use of oral hypoglycemic drugs: Secondary | ICD-10-CM | POA: Diagnosis not present

## 2021-01-11 DIAGNOSIS — S01111A Laceration without foreign body of right eyelid and periocular area, initial encounter: Secondary | ICD-10-CM | POA: Insufficient documentation

## 2021-01-11 MED ORDER — TETANUS-DIPHTH-ACELL PERTUSSIS 5-2.5-18.5 LF-MCG/0.5 IM SUSY
0.5000 mL | PREFILLED_SYRINGE | Freq: Once | INTRAMUSCULAR | Status: AC
  Administered 2021-01-11: 0.5 mL via INTRAMUSCULAR
  Filled 2021-01-11: qty 0.5

## 2021-01-11 NOTE — ED Notes (Signed)
Pt remains pleasantly confused, awake. Aware she is being discharged back to her facility but asking to call the fire department because she thinks she needs to pay them to take her back. Updated on wait time for transportation. She expressed understanding. No additional needs voiced at this time.

## 2021-01-11 NOTE — ED Provider Notes (Signed)
[facility] EMERGENCY DEPARTMENT Provider Note   CSN: [id number] Arrival date & time: 01/11/21  4098     History Chief Complaint  Patient presents with  . Fall    Kendra Nicholson is a 70 y.o. female.  Patient fell the nursing home and hit her head.  No loss of consciousness.  Patient is acting like her normal self  The history is provided by the patient and medical records. No language interpreter was used.  Fall This is a new problem. The current episode started 6 to 12 hours ago. The problem has been resolved. Pertinent negatives include no chest pain, no abdominal pain and no headaches. Nothing aggravates the symptoms. Nothing relieves the symptoms. She has tried nothing for the symptoms. The treatment provided no relief.       Past Medical History:  Diagnosis Date  . Anxiety   . Arthritis   . Colon polyps   . Depression   . Diabetes mellitus without complication ([name])   . Fibromyalgia   . Headache   . Hyperlipidemia   . Hypertension   . Parkinson's disease ([name])   . Psoriasis   . PTSD (post-traumatic stress disorder)   . Seasonal allergies   . Sleep apnea     Patient Active Problem List   Diagnosis Date Noted  . Malnutrition of moderate degree 10/30/2020  . Failure to thrive in adult 10/29/2020  . UTI (urinary tract infection) 10/29/2020  . Poor appetite 10/29/2020  . Anemia 01/12/2019  . Anxiety 01/12/2019  . Carpal tunnel syndrome 01/12/2019  . Menopausal syndrome 01/12/2019  . Personal history of allergy to insect bites and stings 01/12/2019  . Psoriasis (a type of skin inflammation) 01/12/2019  . Seasonal allergies 01/12/2019  . Bipolar affective disorder, depressed, moderate ([name]) 12/30/2018  . Suicidal ideation 11/27/2017  . Delirium due to multiple etiologies, acute, mixed level of activity 10/22/2017  . Encephalopathy 10/12/2017  . Confusion 10/04/2017  . Diabetes mellitus type 2, uncomplicated ([place]) 11/91/4782  . Fibromyalgia 10/04/2017  .  Migraines 10/04/2017  . Osteoarthritis 10/04/2017  . Bipolar 1 disorder, mixed, partial remission ([name]) 12/19/2015  . Personality disorder ([name]) 12/12/2015  . Obstructive sleep apnea syndrome 06/11/2015  . PTSD (post-traumatic stress disorder) 05/09/2015  . Hypertension 05/09/2015  . Diabetes ([name]) 05/09/2015  . Bipolar disorder, current episode manic severe with psychotic features ([name]) 05/09/2015  . 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 Benign essential hypertension 01/18/2014  . Hyperlipemia, mixed 01/18/2014  . Vitamin D deficiency 01/18/2014    Past Surgical History:  Procedure Laterality Date  . ABDOMINAL HYSTERECTOMY    . ABDOMINAL SURGERY    . COLON SURGERY    . COLONOSCOPY    . POLYPECTOMY    . RIGHT OOPHORECTOMY    . TONSILLECTOMY       OB History   No obstetric history on file.     Family History  Problem Relation Age of Onset  . Breast cancer Maternal Grandmother     Social History   Tobacco Use  . Smoking status: Former Research scientist (life sciences)  . Smokeless tobacco: Never Used  Vaping Use  . Vaping Use: Never used  Substance Use Topics  . Alcohol use: No  . Drug use: No    Home Medications Prior to Admission medications   Medication Sig Start Date End Date Taking? Authorizing Provider  acetaminophen (TYLENOL) 325 MG tablet Take 2 tablets (650 mg total) by mouth every 6 (six) hours as needed for mild pain. Patient taking  differently: Take 650 mg by mouth 3 (three) times daily. 05/22/15   Pucilowska, Jolanta B, MD  amLODipine (NORVASC) 10 MG tablet Take 10 mg by mouth daily. 10/03/20   [provider]  aspirin EC 81 MG tablet Take 81 mg by mouth daily.    [provider]  atorvastatin (LIPITOR) 40 MG tablet Take 40 mg by mouth daily.    [provider]  carbidopa-levodopa (SINEMET CR) 50-200 MG tablet Take 1 tablet by mouth at bedtime. 10/03/20   [provider]  carbidopa-levodopa (SINEMET) 25-100 MG tablet Take 1 tablet by mouth 3  (three) times daily. 12/19/15   Milton Ferguson, MD  carvedilol (COREG) 25 MG tablet Take 25 mg by mouth 2 (two) times daily with a meal. 12/06/17   [provider]  EPINEPHrine (EPIPEN 2-PAK) 0.3 mg/0.3 mL IJ SOAJ injection Inject 0.3 mLs (0.3 mg total) into the muscle once as needed (for severe allergic reaction). 05/22/15   Pucilowska, Herma Ard B, MD  lamoTRIgine (LAMICTAL) 25 MG tablet Take 50 mg by mouth 2 (two) times daily as needed. 10/03/20   [provider]  lithium 300 MG tablet Take 300 mg by mouth 2 (two) times daily.    [provider]  loratadine (CLARITIN) 10 MG tablet Take 10 mg by mouth daily as needed for allergies.    [provider]  magnesium oxide (MAG-OX) 400 MG tablet Take 2 tablets by mouth 2 (two) times daily.    [provider]  metFORMIN (GLUCOPHAGE) 1000 MG tablet Take 1 tablet (1,000 mg total) by mouth 2 (two) times daily. 12/19/15   Milton Ferguson, MD  polyethylene glycol (MIRALAX / Floria Raveling) packet Take 17 g by mouth daily.    [provider]  QUEtiapine (SEROQUEL XR) 200 MG 24 hr tablet Take 200 mg by mouth at bedtime. 10/03/20   [provider]  QUEtiapine (SEROQUEL) 50 MG tablet Take 50 mg by mouth 3 (three) times daily.    [provider]  sertraline (ZOLOFT) 100 MG tablet Take 100 mg by mouth daily. 10/03/20   [provider]  Valbenazine Tosylate (INGREZZA) 80 MG CAPS Take 80 mg by mouth daily.    [provider]    Allergies    Bee venom, Demerol [meperidine], Ivp dye [iodinated diagnostic agents], Shellfish allergy, Sulfa antibiotics, Adhesive [tape], Iodine, and Latex  Review of Systems   Review of Systems  Constitutional: Negative for appetite change and fatigue.  HENT: Negative for congestion, ear discharge and sinus pressure.   Eyes: Negative for discharge.  Respiratory: Negative for cough.   Cardiovascular: Negative for chest pain.  Gastrointestinal: Negative for  abdominal pain and diarrhea.  Genitourinary: Negative for frequency and hematuria.  Musculoskeletal: Negative for back pain.  Skin: Negative for rash.  Neurological: Negative for seizures and headaches.  Psychiatric/Behavioral: Negative for hallucinations.    Physical Exam Updated Vital Signs BP (!) 141/56   Pulse 67   Temp (!) 97.4 F (36.3 C) (Oral)   Resp (!) 169   Ht 5\' 6"  (1.676 m)   Wt 69 kg   SpO2 98%   BMI 24.55 kg/m   Physical Exam Vitals and nursing note reviewed.  Constitutional:      Appearance: She is well-developed.  HENT:     Head: Normocephalic.     Comments: 1 cm laceration right eyebrow    Nose: Nose normal.  Eyes:     General: No scleral icterus.    Conjunctiva/sclera: Conjunctivae normal.  Neck:     Thyroid: No thyromegaly.  Cardiovascular:     Rate and Rhythm: Normal rate and regular rhythm.     Heart sounds: No murmur heard. No friction rub. No gallop.   Pulmonary:     Breath sounds: No stridor. No wheezing or rales.  Chest:     Chest wall: No tenderness.  Abdominal:     General: There is no distension.     Tenderness: There is no abdominal tenderness. There is no rebound.  Musculoskeletal:        General: Normal range of motion.     Cervical back: Neck supple.  Lymphadenopathy:     Cervical: No cervical adenopathy.  Skin:    Findings: No erythema or rash.  Neurological:     Mental Status: She is alert and oriented to person, place, and time.     Motor: No abnormal muscle tone.     Coordination: Coordination normal.  Psychiatric:        Behavior: Behavior normal.     ED Results / Procedures / Treatments   Labs (all labs ordered are listed, but only abnormal results are displayed) Labs Reviewed - No data to display  EKG None  Radiology CT Head Wo Contrast  Result Date: 01/11/2021 CLINICAL DATA:  Minor head trauma.  Laceration to the forehead. EXAM: CT HEAD WITHOUT CONTRAST CT CERVICAL SPINE WITHOUT CONTRAST TECHNIQUE:  Multidetector CT imaging of the head and cervical spine was performed following the standard protocol without intravenous contrast. Multiplanar CT image reconstructions of the cervical spine were also generated. COMPARISON:  10/29/2020 FINDINGS: CT HEAD FINDINGS Brain: No evidence of acute infarction, hemorrhage, hydrocephalus, extra-axial collection or mass lesion/mass effect. Age advanced brain atrophy most notable along the sylvian fissures. Vascular: No hyperdense vessel or unexpected calcification. Skull: Anterior and posterior right-sided scalp hematomas. No calvarial fracture Sinuses/Orbits: No visible injury Other: Motion degraded. CT CERVICAL SPINE FINDINGS Alignment: No traumatic malalignment.  Dextrocurvature. Skull base and vertebrae: No acute fracture Soft tissues and spinal canal: No prevertebral fluid or swelling. No visible canal hematoma. 15 mm nodule just below the left parotid tail, favor lymph node. Disc levels: Cervical spine degeneration especially affecting left-sided facets with spurs encroaching on left-sided foramina. Upper chest: Negative IMPRESSION: 1. No evidence of acute intracranial or cervical spine injury. 2. Scalp hematomas without calvarial fracture. 3. Motion degraded head CT. 4. Brain atrophy. Electronically Signed   By: Monte Fantasia M.D.   On: 01/11/2021 07:46   CT Cervical Spine Wo Contrast  Result Date: 01/11/2021 CLINICAL DATA:  Minor head trauma.  Laceration to the forehead. EXAM: CT HEAD WITHOUT CONTRAST CT CERVICAL SPINE WITHOUT CONTRAST TECHNIQUE: Multidetector CT imaging of the head and cervical spine was performed following the standard protocol without intravenous contrast. Multiplanar CT image reconstructions of the cervical spine were also generated. COMPARISON:  10/29/2020 FINDINGS: CT HEAD FINDINGS Brain: No evidence of acute infarction, hemorrhage, hydrocephalus, extra-axial collection or mass lesion/mass effect. Age advanced brain atrophy most notable  along the sylvian fissures. Vascular: No hyperdense vessel or unexpected calcification. Skull: Anterior and posterior right-sided scalp hematomas. No calvarial fracture Sinuses/Orbits: No visible injury Other: Motion degraded. CT CERVICAL SPINE FINDINGS Alignment: No traumatic malalignment.  Dextrocurvature. Skull base and vertebrae: No acute fracture Soft tissues and spinal canal: No prevertebral fluid or swelling. No visible canal hematoma. 15 mm nodule just below the left parotid tail, favor lymph node. Disc levels: Cervical spine degeneration especially affecting left-sided facets with spurs encroaching on left-sided  foramina. Upper chest: Negative IMPRESSION: 1. No evidence of acute intracranial or cervical spine injury. 2. Scalp hematomas without calvarial fracture. 3. Motion degraded head CT. 4. Brain atrophy. Electronically Signed   By: Monte Fantasia M.D.   On: 01/11/2021 07:46    Procedures .Marland KitchenLaceration Repair  Date/Time: 01/15/2021 10:41 AM Performed by: Milton Ferguson, MD Authorized by: Milton Ferguson, MD   Comments:     Patient with a 1 cm superficial laceration over the right eyebrow.  Area was cleaned thoroughly with Betadine and glued closed.  Patient tolerated procedure well     Medications Ordered in ED Medications  Tdap (BOOSTRIX) injection 0.5 mL (0.5 mLs Intramuscular Given 01/11/21 0758)    ED Course  I have reviewed the triage vital signs and the nursing notes.  Pertinent labs & imaging results that were available during my care of the patient were reviewed by me and considered in my medical decision making (see chart for details). CT of the head and neck negative.  Patient had a 1 cm laceration in her right eyebrow that was cleaned thoroughly and closed with glue   MDM Rules/Calculators/A&P                          Patient with a fall and small laceration to forehead.  She will follow-up with her PCP Final Clinical Impression(s) / ED Diagnoses Final diagnoses:   None    Rx / DC Orders ED Discharge Orders    None       Milton Ferguson, MD 01/15/21 1042

## 2021-01-11 NOTE — Discharge Instructions (Addendum)
Follow-up with your doctor if any problems.  Clean laceration gently with soap and water twice a day

## 2021-01-11 NOTE — ED Notes (Addendum)
Transport here to take pt back to [facility]. DC paperwork, face sheet, transfer necessity forms given to medics. Pt has dementia, does not understand signing the electronic discharge form.

## 2021-01-11 NOTE — ED Triage Notes (Signed)
Pt here from [facility] in [place] after she fell resulting in a laceration to her R forehead area (per staff @ NH). Site wrapped at present. Pt also with swelling to nose along with bruising under R eye.

## 2021-07-10 IMAGING — CT CT HEAD W/O CM
3 series · 16 of 47 positions shown, 19 images · non-contrast
Comparison: 10/01/2017

CLINICAL DATA: Frequent falls

EXAM:
CT HEAD WITHOUT CONTRAST
TECHNIQUE: Contiguous axial images were obtained from the base of the skull
through the vertex without intravenous contrast.

[Series 3: head wo · axial · 0.46mm/px · z∈[-194,-59]mm · 10 of 33 slices shown, 13 images]
[im 3/33  brain]
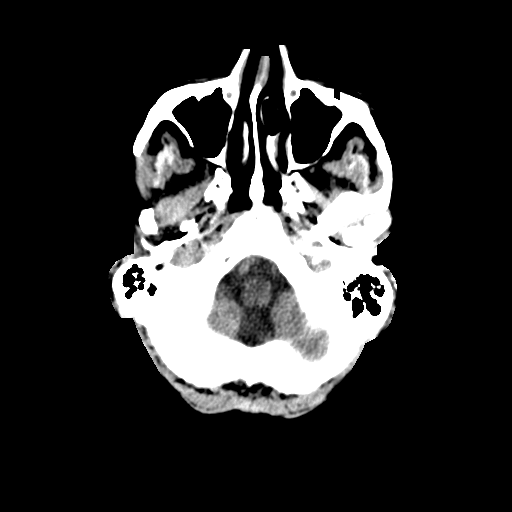
[im 3/33  bone]
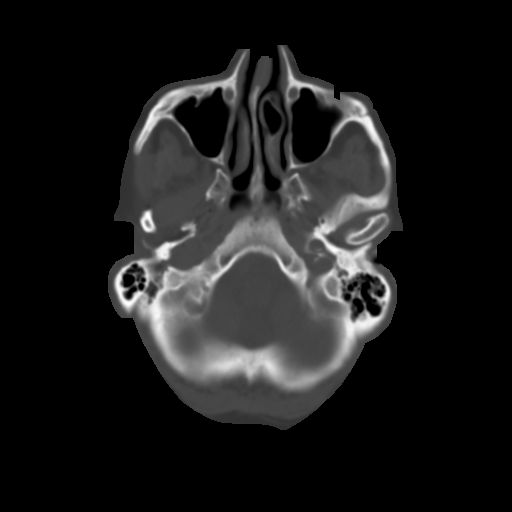
[im 6/33  brain]
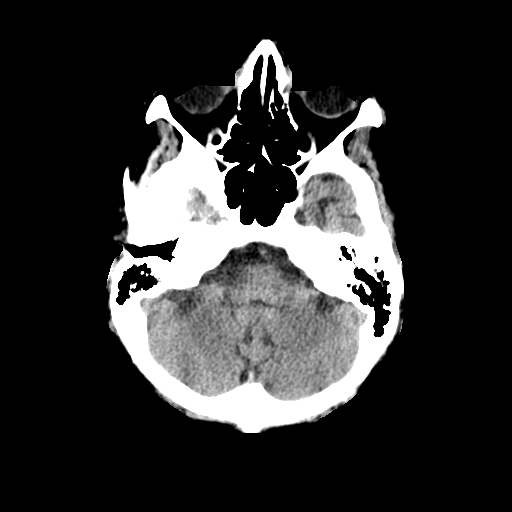
[im 9/33  brain]
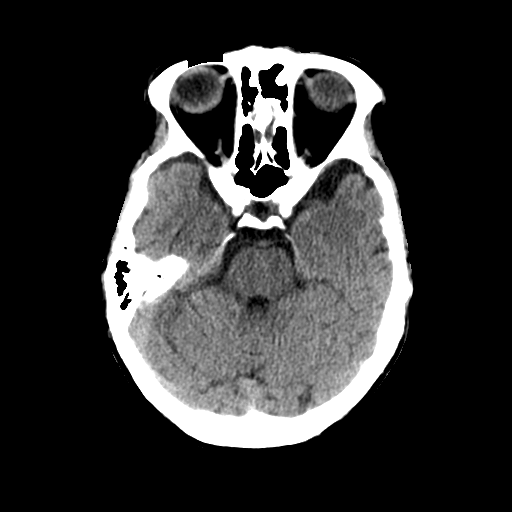
[im 12/33  brain]
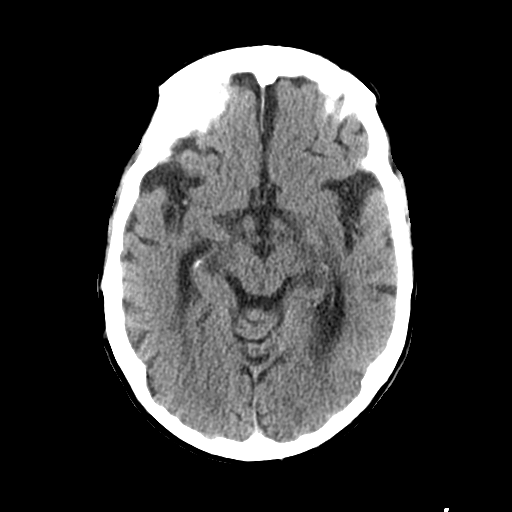
[im 15/33  brain]
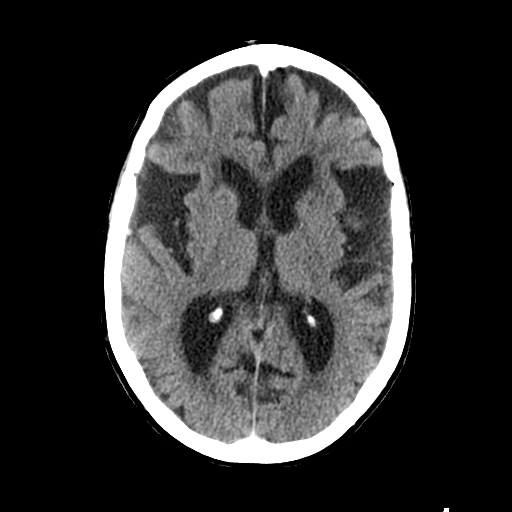
[im 15/33  bone]
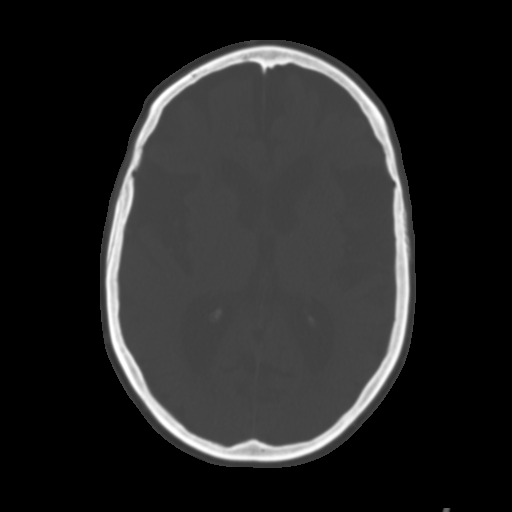
[im 18/33  brain]
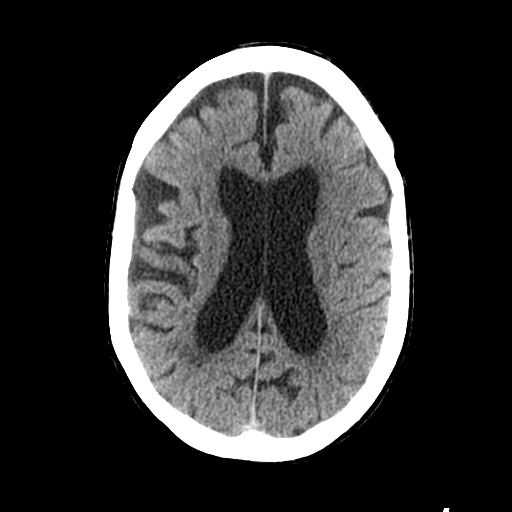
[im 21/33  brain]
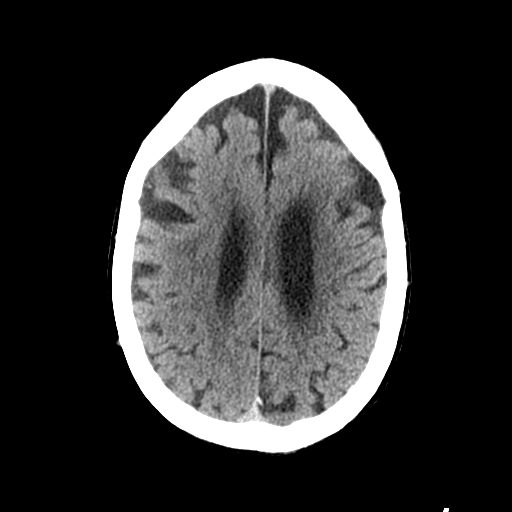
[im 25/33  brain]
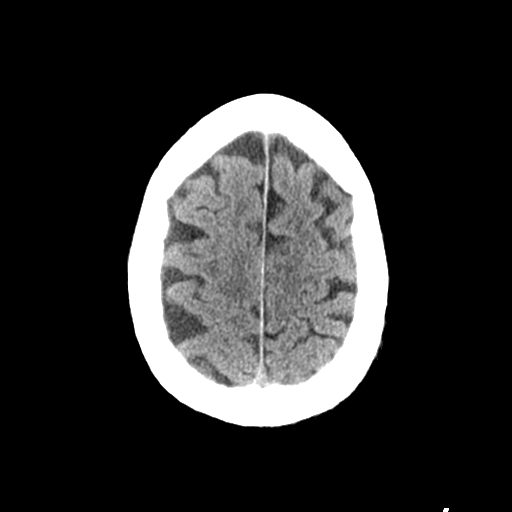
[im 27/33  brain]
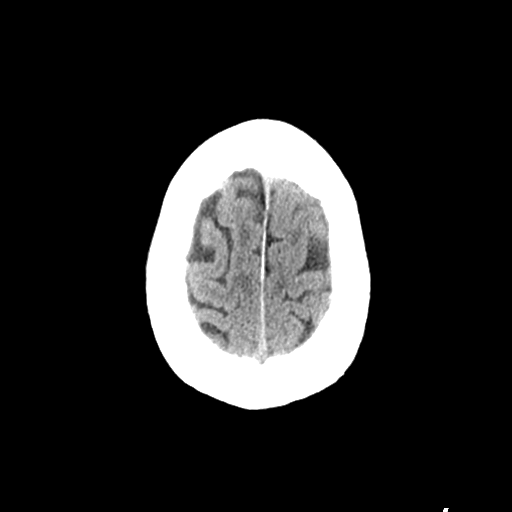
[im 27/33  bone]
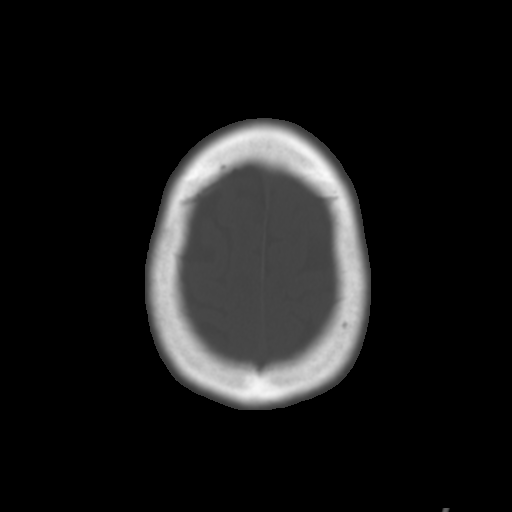
[im 30/33  brain]
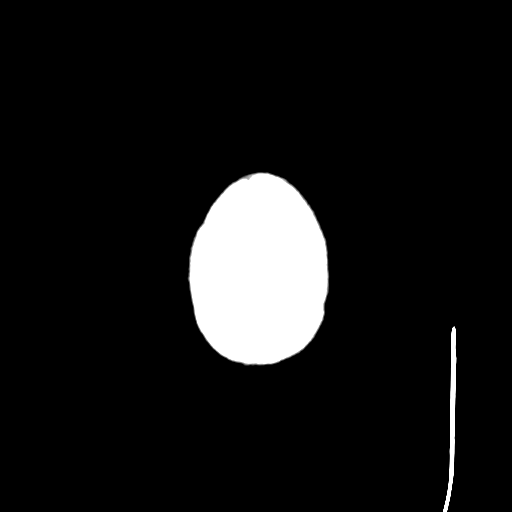

[Series 4: coronal soft tissue · coronal · 0.32mm/px · 3 of 69 slices shown]
[im 23/69  brain]
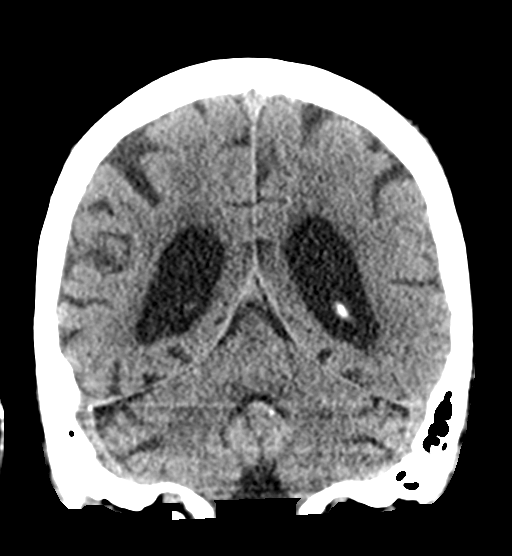
[im 31/69  brain]
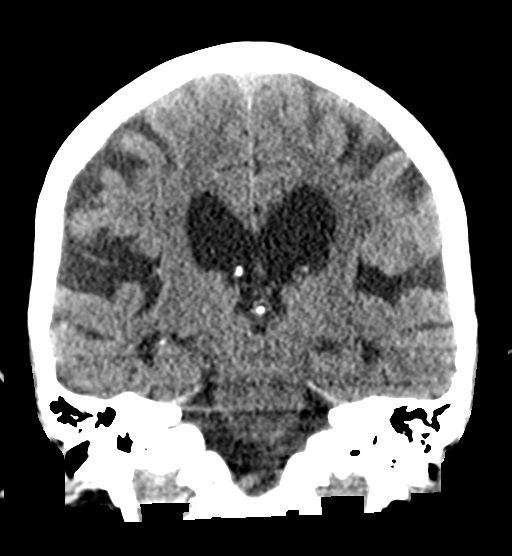
[im 38/69  brain]
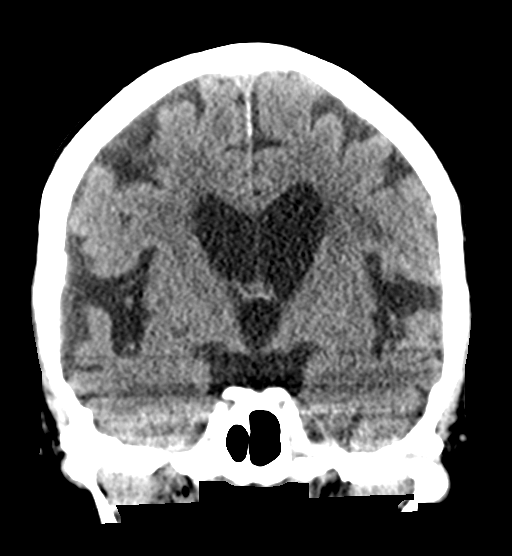

[Series 5: sagittal soft tissue · sagittal · 0.34mm/px · 3 of 57 slices shown]
[im 19/57  brain]
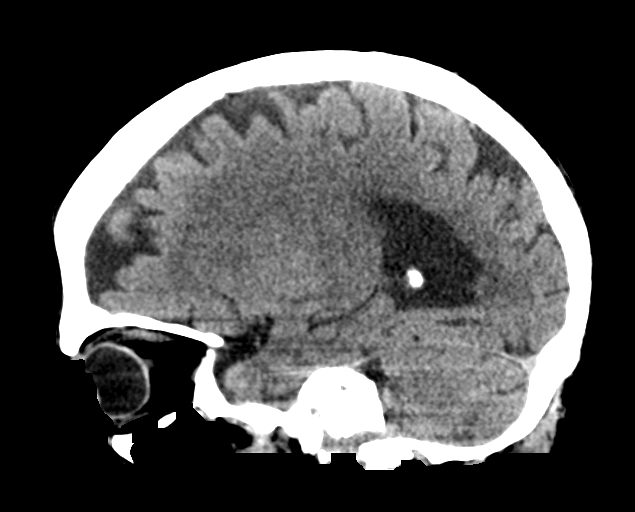
[im 29/57  brain]
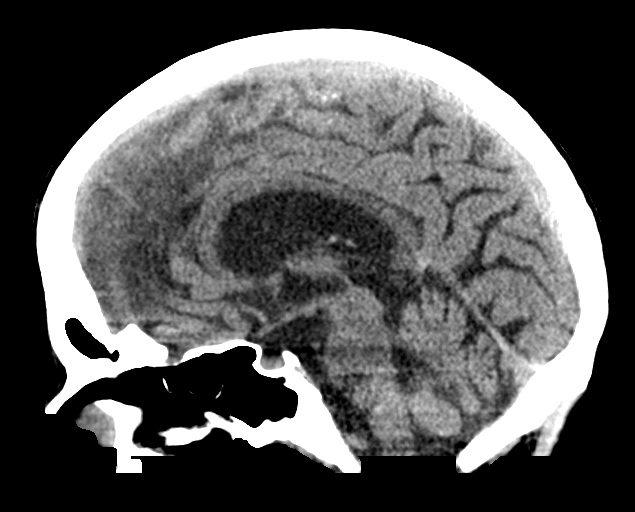
[im 38/57  brain]
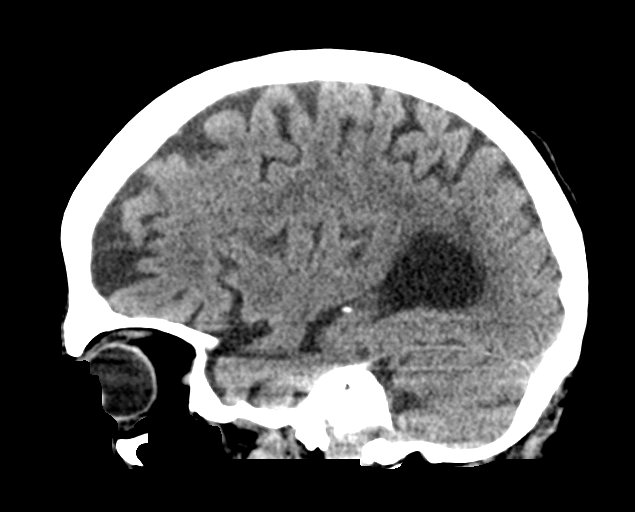

[16 of 47 positions shown; findings below may reference images not displayed]

FINDINGS: Brain: There is atrophy and chronic small vessel disease changes. No
acute intracranial abnormality. Specifically, no hemorrhage,
hydrocephalus, mass lesion, acute infarction, or significant
intracranial injury.

Vascular: No hyperdense vessel or unexpected calcification.

Skull: No acute calvarial abnormality.

Sinuses/Orbits: Visualized paranasal sinuses and mastoids clear.
Orbital soft tissues unremarkable.

Other: None
IMPRESSION: Atrophy, chronic microvascular disease.

No acute intracranial abnormality.
